# Patient Record
Sex: Female | Born: 1980 | Race: Black or African American | Hispanic: No | Marital: Single | Smoking: Never smoker
Health system: Southern US, Community
[De-identification: ages and names within clinical notes are randomized; demographics above are authoritative.]

## PROBLEM LIST (undated history)

## (undated) ENCOUNTER — Inpatient Hospital Stay (HOSPITAL_COMMUNITY): Payer: Self-pay

## (undated) DIAGNOSIS — I10 Essential (primary) hypertension: Secondary | ICD-10-CM

## (undated) DIAGNOSIS — A64 Unspecified sexually transmitted disease: Secondary | ICD-10-CM

## (undated) DIAGNOSIS — D259 Leiomyoma of uterus, unspecified: Secondary | ICD-10-CM

## (undated) DIAGNOSIS — K219 Gastro-esophageal reflux disease without esophagitis: Secondary | ICD-10-CM

## (undated) HISTORY — PX: INDUCED ABORTION: SHX677

## (undated) HISTORY — DX: Unspecified sexually transmitted disease: A64

## (undated) HISTORY — DX: Essential (primary) hypertension: I10

## (undated) HISTORY — PX: CYSTECTOMY: SUR359

---

## 2001-03-18 ENCOUNTER — Encounter: Payer: Self-pay | Admitting: Emergency Medicine

## 2001-03-18 ENCOUNTER — Emergency Department (HOSPITAL_COMMUNITY): Admission: EM | Admit: 2001-03-18 | Discharge: 2001-03-18 | Payer: Self-pay | Admitting: Emergency Medicine

## 2003-03-04 ENCOUNTER — Emergency Department (HOSPITAL_COMMUNITY): Admission: EM | Admit: 2003-03-04 | Discharge: 2003-03-04 | Payer: Self-pay | Admitting: Emergency Medicine

## 2004-02-28 ENCOUNTER — Emergency Department (HOSPITAL_COMMUNITY): Admission: EM | Admit: 2004-02-28 | Discharge: 2004-02-29 | Payer: Self-pay | Admitting: Emergency Medicine

## 2004-03-02 ENCOUNTER — Emergency Department (HOSPITAL_COMMUNITY): Admission: EM | Admit: 2004-03-02 | Discharge: 2004-03-02 | Payer: Self-pay

## 2004-03-04 ENCOUNTER — Emergency Department (HOSPITAL_COMMUNITY): Admission: EM | Admit: 2004-03-04 | Discharge: 2004-03-04 | Payer: Self-pay | Admitting: *Deleted

## 2005-03-12 ENCOUNTER — Emergency Department (HOSPITAL_COMMUNITY): Admission: EM | Admit: 2005-03-12 | Discharge: 2005-03-12 | Payer: Self-pay | Admitting: Emergency Medicine

## 2005-03-20 ENCOUNTER — Ambulatory Visit: Payer: Self-pay | Admitting: Nurse Practitioner

## 2005-03-25 ENCOUNTER — Ambulatory Visit: Payer: Self-pay | Admitting: Nurse Practitioner

## 2005-04-08 ENCOUNTER — Ambulatory Visit: Payer: Self-pay | Admitting: Nurse Practitioner

## 2005-04-19 ENCOUNTER — Ambulatory Visit: Payer: Self-pay | Admitting: *Deleted

## 2005-06-26 ENCOUNTER — Ambulatory Visit: Payer: Self-pay | Admitting: Nurse Practitioner

## 2005-09-11 ENCOUNTER — Ambulatory Visit: Payer: Self-pay | Admitting: Nurse Practitioner

## 2005-12-11 ENCOUNTER — Emergency Department (HOSPITAL_COMMUNITY): Admission: EM | Admit: 2005-12-11 | Discharge: 2005-12-11 | Payer: Self-pay | Admitting: Emergency Medicine

## 2006-04-15 ENCOUNTER — Emergency Department (HOSPITAL_COMMUNITY): Admission: EM | Admit: 2006-04-15 | Discharge: 2006-04-15 | Payer: Self-pay | Admitting: Family Medicine

## 2006-04-20 ENCOUNTER — Ambulatory Visit: Payer: Self-pay | Admitting: Nurse Practitioner

## 2006-04-21 ENCOUNTER — Emergency Department (HOSPITAL_COMMUNITY): Admission: EM | Admit: 2006-04-21 | Discharge: 2006-04-22 | Payer: Self-pay | Admitting: Emergency Medicine

## 2006-04-22 ENCOUNTER — Emergency Department (HOSPITAL_COMMUNITY): Admission: EM | Admit: 2006-04-22 | Discharge: 2006-04-22 | Payer: Self-pay | Admitting: Emergency Medicine

## 2006-05-05 ENCOUNTER — Ambulatory Visit: Payer: Self-pay | Admitting: Nurse Practitioner

## 2006-05-07 ENCOUNTER — Ambulatory Visit: Payer: Self-pay | Admitting: Nurse Practitioner

## 2006-05-19 ENCOUNTER — Ambulatory Visit: Payer: Self-pay | Admitting: Nurse Practitioner

## 2006-07-10 ENCOUNTER — Emergency Department (HOSPITAL_COMMUNITY): Admission: EM | Admit: 2006-07-10 | Discharge: 2006-07-10 | Payer: Self-pay | Admitting: Emergency Medicine

## 2006-08-31 ENCOUNTER — Emergency Department (HOSPITAL_COMMUNITY): Admission: EM | Admit: 2006-08-31 | Discharge: 2006-08-31 | Payer: Self-pay | Admitting: *Deleted

## 2006-09-02 ENCOUNTER — Inpatient Hospital Stay (HOSPITAL_COMMUNITY): Admission: AD | Admit: 2006-09-02 | Discharge: 2006-09-02 | Payer: Self-pay | Admitting: Obstetrics and Gynecology

## 2006-09-06 ENCOUNTER — Inpatient Hospital Stay (HOSPITAL_COMMUNITY): Admission: AD | Admit: 2006-09-06 | Discharge: 2006-09-06 | Payer: Self-pay | Admitting: Obstetrics & Gynecology

## 2006-09-14 ENCOUNTER — Inpatient Hospital Stay (HOSPITAL_COMMUNITY): Admission: AD | Admit: 2006-09-14 | Discharge: 2006-09-14 | Payer: Self-pay | Admitting: Obstetrics and Gynecology

## 2006-09-20 ENCOUNTER — Ambulatory Visit: Payer: Self-pay | Admitting: Nurse Practitioner

## 2006-10-08 ENCOUNTER — Ambulatory Visit: Payer: Self-pay | Admitting: Gynecology

## 2007-05-25 ENCOUNTER — Emergency Department (HOSPITAL_COMMUNITY): Admission: EM | Admit: 2007-05-25 | Discharge: 2007-05-25 | Payer: Self-pay | Admitting: Family Medicine

## 2007-06-01 ENCOUNTER — Emergency Department (HOSPITAL_COMMUNITY): Admission: EM | Admit: 2007-06-01 | Discharge: 2007-06-01 | Payer: Self-pay | Admitting: Family Medicine

## 2008-09-10 ENCOUNTER — Emergency Department (HOSPITAL_COMMUNITY): Admission: EM | Admit: 2008-09-10 | Discharge: 2008-09-11 | Payer: Self-pay | Admitting: Emergency Medicine

## 2010-09-17 LAB — URINALYSIS, ROUTINE W REFLEX MICROSCOPIC
Bilirubin Urine: NEGATIVE
Hgb urine dipstick: NEGATIVE
Specific Gravity, Urine: 1.03 (ref 1.005–1.030)
pH: 6 (ref 5.0–8.0)

## 2010-09-28 ENCOUNTER — Emergency Department (HOSPITAL_COMMUNITY)
Admission: EM | Admit: 2010-09-28 | Discharge: 2010-09-28 | Disposition: A | Discharge status: Home / Self Care | Payer: Self-pay | Attending: Emergency Medicine | Admitting: Emergency Medicine

## 2010-09-28 DIAGNOSIS — M79609 Pain in unspecified limb: Secondary | ICD-10-CM | POA: Insufficient documentation

## 2010-09-28 DIAGNOSIS — X58XXXA Exposure to other specified factors, initial encounter: Secondary | ICD-10-CM | POA: Insufficient documentation

## 2010-09-28 DIAGNOSIS — T148XXA Other injury of unspecified body region, initial encounter: Secondary | ICD-10-CM | POA: Insufficient documentation

## 2010-10-24 NOTE — Group Therapy Note (Signed)
NAMEMarland Kitchen  VEE, BAHE                 ACCOUNT NO.:  [id number]   MEDICAL RECORD NO.:  [id number]          PATIENT TYPE:  WOC   LOCATION:  [facility]                   FACILITY:  [facility]   PHYSICIAN:  Ginger Carne, MD DATE OF BIRTH:  Feb 09, 1981   DATE OF SERVICE:                                  CLINIC NOTE   CLINIC NOTE:  This patient is a 30 year old African-American female who  had a spontaneous abortion on 09/06/2006.  At this time she has no  specific symptoms.  No abnormal bleeding, cramping, or pain.   PHYSICAL FINDINGS:  ABDOMEN:  Soft.  PELVIC:  External genitalia, vulva  and vagina normal.  Cervix smooth without erosions or lesions.  Uterus  small, anteverted and flexed.  Both adnexa palpable and found to be  normal and nontender.   IMPRESSION:  Spontaneous abortion.   PLAN:  The patient wishes to conceive again.  She was advised to wait 3  to 6 months at a minimum and to keep track of her menses.           ______________________________  Ginger Carne, MD     SHB/MEDQ  D:  10/08/2006  T:  10/08/2006  Job:  [id number]

## 2011-04-09 ENCOUNTER — Emergency Department (HOSPITAL_COMMUNITY)
Admission: EM | Admit: 2011-04-09 | Discharge: 2011-04-09 | Disposition: A | Discharge status: Home / Self Care | Payer: Self-pay | Attending: Emergency Medicine | Admitting: Emergency Medicine

## 2011-04-09 DIAGNOSIS — M545 Low back pain, unspecified: Secondary | ICD-10-CM | POA: Insufficient documentation

## 2011-04-09 DIAGNOSIS — M549 Dorsalgia, unspecified: Secondary | ICD-10-CM | POA: Insufficient documentation

## 2011-12-31 ENCOUNTER — Emergency Department (INDEPENDENT_AMBULATORY_CARE_PROVIDER_SITE_OTHER)
Admission: EM | Admit: 2011-12-31 | Discharge: 2011-12-31 | Disposition: A | Discharge status: Home / Self Care | Payer: Self-pay | Source: Home / Self Care | Attending: Family Medicine | Admitting: Family Medicine

## 2011-12-31 ENCOUNTER — Encounter (HOSPITAL_COMMUNITY): Payer: Self-pay | Admitting: Emergency Medicine

## 2011-12-31 DIAGNOSIS — N898 Other specified noninflammatory disorders of vagina: Secondary | ICD-10-CM

## 2011-12-31 DIAGNOSIS — L0231 Cutaneous abscess of buttock: Secondary | ICD-10-CM

## 2011-12-31 LAB — POCT URINALYSIS DIP (DEVICE)
Bilirubin Urine: NEGATIVE
Ketones, ur: NEGATIVE mg/dL
Leukocytes, UA: NEGATIVE
pH: 6.5 (ref 5.0–8.0)

## 2011-12-31 LAB — WET PREP, GENITAL
Clue Cells Wet Prep HPF POC: NONE SEEN
Trich, Wet Prep: NONE SEEN
Yeast Wet Prep HPF POC: NONE SEEN

## 2011-12-31 MED ORDER — HYDROCODONE-ACETAMINOPHEN 5-500 MG PO TABS: 1.0000 | ORAL_TABLET | Freq: Four times a day (QID) | ORAL | Status: AC | PRN

## 2011-12-31 MED ORDER — FLUCONAZOLE 150 MG PO TABS: ORAL_TABLET | ORAL | Status: DC

## 2011-12-31 MED ORDER — DOXYCYCLINE HYCLATE 100 MG PO CAPS: 100.0000 mg | ORAL_CAPSULE | Freq: Two times a day (BID) | ORAL | Status: AC

## 2011-12-31 MED ORDER — METRONIDAZOLE 500 MG PO TABS: 500.0000 mg | ORAL_TABLET | Freq: Two times a day (BID) | ORAL | Status: AC

## 2011-12-31 NOTE — ED Notes (Signed)
C/c pt states vaginal discharge. It started 1 week ago.pt states of cramps on  lower abdomen. posibility of boil,right in middle btw anus and vagina. Boil started acting up since last Thursday.pt denies being pregnant. Pt has not take meds for any of this conditions.

## 2012-01-01 LAB — GC/CHLAMYDIA PROBE AMP, GENITAL: Chlamydia, DNA Probe: NEGATIVE

## 2012-01-02 NOTE — ED Provider Notes (Signed)
History     CSN: [id number]  Arrival date & time 12/31/11  1845   First MD Initiated Contact with Patient 12/31/11 1940      Chief Complaint  Patient presents with  . Vaginal Discharge    (Consider location/radiation/quality/duration/timing/severity/associated sxs/prior treatment) HPI Comments: 31 y/o female with no significant past ,edical history here c/o: 1) bad odorous vaginal discharge for 1 week associated with intermittent low abdominal cramps. denies dysuria or hematuria. Will like to be checked for STDs. Although declined blood tests.  2) Patient c/o area of tenderness, swelling for several days in left buttock near rectal area. Denies spontaneous drainage. No fever or chills. No h/o rectal fissures, chrons or colitis.      History reviewed. No pertinent past medical history.  Past Surgical History  Procedure Date  . Cystectomy     No family history on file.  History  Substance Use Topics  . Smoking status: Not on file  . Smokeless tobacco: Not on file  . Alcohol Use: Yes    OB History    Grav Para Term Preterm Abortions TAB SAB Ect Mult Living                  Review of Systems  Constitutional: Negative for fever and chills.  Respiratory: Negative for shortness of breath.   Cardiovascular: Negative for chest pain, palpitations and leg swelling.  Gastrointestinal: Negative for nausea, vomiting and abdominal pain.  Genitourinary: Positive for vaginal discharge. Negative for dysuria, urgency, hematuria, flank pain and genital sores.  Skin:       Boil in left buttock as per HPI.  Neurological: Negative for dizziness and headaches.  All other systems reviewed and are negative.    Allergies  Review of patient's allergies indicates no known allergies.  Home Medications   Current Outpatient Rx  Name Route Sig Dispense Refill  . DOXYCYCLINE HYCLATE 100 MG PO CAPS Oral Take 1 capsule (100 mg total) by mouth 2 (two) times daily. 20 capsule 0  .  FLUCONAZOLE 150 MG PO TABS  1 tab po q72 h x3 3 tablet 0  . HYDROCODONE-ACETAMINOPHEN 5-500 MG PO TABS Oral Take 1 tablet by mouth every 6 (six) hours as needed for pain. 10 tablet 0  . METRONIDAZOLE 500 MG PO TABS Oral Take 1 tablet (500 mg total) by mouth 2 (two) times daily. 14 tablet 0    BP 159/116  Pulse 79  Temp 98.1 F (36.7 C) (Oral)  Resp 20  SpO2 100%  LMP 12/16/2011  Physical Exam  Nursing note and vitals reviewed. Constitutional: She is oriented to person, place, and time. She appears well-developed and well-nourished.       Uncomfortable with pain in gluteal area.  Cardiovascular: Normal rate, regular rhythm and normal heart sounds.  Exam reveals no gallop and no friction rub.   No murmur heard. Pulmonary/Chest: Breath sounds normal. No respiratory distress.  Genitourinary: Rectum normal and uterus normal. Rectal exam shows no external hemorrhoid, no internal hemorrhoid, no fissure, no mass, no tenderness and anal tone normal. There is no rash or lesion on the right labia. There is no rash or lesion on the left labia. Cervix exhibits no motion tenderness. Right adnexum displays no mass, no tenderness and no fullness. Left adnexum displays no mass, no tenderness and no fullness. Vaginal discharge found.  Neurological: She is alert and oriented to person, place, and time.  Skin:       Left gluteal area (near but not  involving rectum) there is a single area of about 2x3cm tenderness to palpation with fluctuation and some induration around consistent with an abscess.     ED Course  INCISION AND DRAINAGE Performed by: Sharin Grave Authorized by: Sharin Grave Consent: Verbal consent obtained. Risks and benefits: risks, benefits and alternatives were discussed Consent given by: patient Patient understanding: patient states understanding of the procedure being performed Patient consent: the patient's understanding of the procedure matches consent given Type:  abscess Body area: anogenital Location details: perianal Anesthesia: local infiltration Local anesthetic: lidocaine 1% with epinephrine Anesthetic total: 3 ml Scalpel size: 11 Incision type: single straight Complexity: simple Drainage: purulent and bloody Drainage amount: moderate Packing material: 1/2 in iodoform gauze Patient tolerance: Patient tolerated the procedure well with no immediate complications. Comments: Wound culture pending   (including critical care time)  Labs Reviewed  WET PREP, GENITAL - Abnormal; Notable for the following:    WBC, Wet Prep HPF POC MANY (*)     All other components within normal limits  POCT URINALYSIS DIP (DEVICE) - Abnormal; Notable for the following:    Hgb urine dipstick MODERATE (*)     Protein, ur 30 (*)     All other components within normal limits  GC/CHLAMYDIA PROBE AMP, GENITAL  CULTURE, ROUTINE-ABSCESS  POCT PREGNANCY, URINE   No results found.   1. Vaginal Discharge   2. Abscess, gluteal, left       MDM  Perirectal abscess s/p I&D today. Wound care instructions provided. Prescribed doxycycline and flagyl. Also gave prescription for Vicodin. Asked to return in 24-48 hours fr recheck. Or Go to the ED if new onset of fever worsening pain, swelling or drainage, despite treatment. Also needs to have BP and urine rechecked. Impress UA results (with moderate hemoglobin) related to contamination as urine sample obtained for pregnancy test after procedure. Patient required diflucan prescription stating that she "gets yeast infection every times she takes antibiotics"        Sharin Grave, MD 01/02/12 1240

## 2012-01-04 NOTE — ED Notes (Signed)
GC/Chlamydia neg., Wet prep: many WBC's, Abscess culture perirectal: Multiple organisms present none predominant. Pt. treated with Doxycycline. Vassie Moselle 01/04/2012

## 2013-11-28 ENCOUNTER — Encounter (HOSPITAL_COMMUNITY): Payer: Self-pay | Admitting: Emergency Medicine

## 2013-11-28 ENCOUNTER — Emergency Department (HOSPITAL_COMMUNITY)
Admission: EM | Admit: 2013-11-28 | Discharge: 2013-11-28 | Disposition: A | Discharge status: Home / Self Care | Payer: No Typology Code available for payment source | Source: Home / Self Care

## 2013-11-28 DIAGNOSIS — J302 Other seasonal allergic rhinitis: Secondary | ICD-10-CM

## 2013-11-28 DIAGNOSIS — J3089 Other allergic rhinitis: Secondary | ICD-10-CM

## 2013-11-28 DIAGNOSIS — J029 Acute pharyngitis, unspecified: Secondary | ICD-10-CM

## 2013-11-28 LAB — POCT RAPID STREP A: STREPTOCOCCUS, GROUP A SCREEN (DIRECT): NEGATIVE

## 2013-11-28 NOTE — ED Provider Notes (Signed)
CSN: [id number]     Arrival date & time 11/28/13  1436 History   None    Chief Complaint  Patient presents with  . Sore Throat   (Consider location/radiation/quality/duration/timing/severity/associated sxs/prior Treatment) HPI Comments: 33 year old female with a sore throat for greater than one week. Denies fever, chills or earache. She does endorse PND.  Patient is a 32 y.o. female presenting with pharyngitis.  Sore Throat Pertinent negatives include no shortness of breath.    History reviewed. No pertinent past medical history. Past Surgical History  Procedure Laterality Date  . Cystectomy     History reviewed. No pertinent family history. History  Substance Use Topics  . Smoking status: Not on file  . Smokeless tobacco: Not on file  . Alcohol Use: Yes   OB History   Grav Para Term Preterm Abortions TAB SAB Ect Mult Living                 Review of Systems  Constitutional: Negative for fever, chills, activity change, appetite change and fatigue.  HENT: Positive for congestion, postnasal drip and rhinorrhea. Negative for facial swelling.   Eyes: Negative.   Respiratory: Negative.  Negative for cough and shortness of breath.   Cardiovascular: Negative.   Musculoskeletal: Negative for neck pain and neck stiffness.  Skin: Negative for pallor and rash.  Neurological: Negative.     Allergies  Review of patient's allergies indicates no known allergies.  Home Medications   Prior to Admission medications   Medication Sig Start Date End Date Taking? Authorizing Provider  fluconazole (DIFLUCAN) 150 MG tablet 1 tab po q72 h x3 12/31/11   Adlih Moreno-Coll, MD   BP 162/121  Pulse 82  Temp(Src) 98.6 F (37 C) (Oral)  Resp 16  SpO2 98%  LMP 10/25/2013 Physical Exam  Nursing note and vitals reviewed. Constitutional: She is oriented to person, place, and time. She appears well-developed and well-nourished. No distress.  HENT:  Mouth/Throat: No oropharyngeal exudate.   Bilateral TMs are mildly retracted, otherwise normal Oropharynx with clear PND mild erythema and cobblestoning.  Eyes: Conjunctivae and EOM are normal.  Neck: Normal range of motion. Neck supple.  Cardiovascular: Normal rate, regular rhythm and normal heart sounds.   Pulmonary/Chest: Effort normal and breath sounds normal. No respiratory distress. She has no wheezes. She has no rales.  Musculoskeletal: Normal range of motion. She exhibits no edema.  Lymphadenopathy:    She has no cervical adenopathy.  Neurological: She is alert and oriented to person, place, and time.  Skin: Skin is warm and dry. No rash noted.  Psychiatric: She has a normal mood and affect.    ED Course  Procedures (including critical care time) Labs Review Labs Reviewed  POCT RAPID STREP A ([facility]ARE ONLY)    Imaging Review No results found. Results for orders placed during the hospital encounter of 11/28/13  POCT RAPID STREP A ([facility] ONLY)      Result Value Ref Range   Streptococcus, Group A Screen (Direct) NEGATIVE  NEGATIVE     MDM   1. Other seasonal allergic rhinitis   2. Allergic pharyngitis     Allegra or claritin Lots of nasal saline Sudafed PE for congestion Flonase nasal spray Lots of fluids po      Janne Napoleon, NP 11/28/13 1552

## 2013-11-28 NOTE — ED Provider Notes (Signed)
Medical screening examination/treatment/procedure(s) were performed by resident physician or non-physician practitioner and as supervising physician I was immediately available for consultation/collaboration.   Pauline Good MD.   Billy Fischer, MD 11/28/13 [phone]

## 2013-11-28 NOTE — ED Notes (Signed)
C/o sore throat States at night the pain gets worst States its to hard swallow Does have heartburn Used tea, halls, and mucinex as tx

## 2013-11-28 NOTE — Discharge Instructions (Signed)
Allergic Rhinitis Allegra or claritin Lots of nasal saline Sudafed PE for congestion Flonase nasal spray Allergic rhinitis is when the mucous membranes in the nose respond to allergens. Allergens are particles in the air that cause your body to have an allergic reaction. This causes you to release allergic antibodies. Through a chain of events, these eventually cause you to release histamine into the blood stream. Although meant to protect the body, it is this release of histamine that causes your discomfort, such as frequent sneezing, congestion, and an itchy, runny nose.  CAUSES  Seasonal allergic rhinitis (hay fever) is caused by pollen allergens that may come from grasses, trees, and weeds. Year-round allergic rhinitis (perennial allergic rhinitis) is caused by allergens such as house dust mites, pet dander, and mold spores.  SYMPTOMS   Nasal stuffiness (congestion).  Itchy, runny nose with sneezing and tearing of the eyes. DIAGNOSIS  Your health care provider can help you determine the allergen or allergens that trigger your symptoms. If you and your health care provider are unable to determine the allergen, skin or blood testing may be used. TREATMENT  Allergic rhinitis does not have a cure, but it can be controlled by:  Medicines and allergy shots (immunotherapy).  Avoiding the allergen. Hay fever may often be treated with antihistamines in pill or nasal spray forms. Antihistamines block the effects of histamine. There are over-the-counter medicines that may help with nasal congestion and swelling around the eyes. Check with your health care provider before taking or giving this medicine.  If avoiding the allergen or the medicine prescribed do not work, there are many new medicines your health care provider can prescribe. Stronger medicine may be used if initial measures are ineffective. Desensitizing injections can be used if medicine and avoidance does not work. Desensitization is when  a patient is given ongoing shots until the body becomes less sensitive to the allergen. Make sure you follow up with your health care provider if problems continue. HOME CARE INSTRUCTIONS It is not possible to completely avoid allergens, but you can reduce your symptoms by taking steps to limit your exposure to them. It helps to know exactly what you are allergic to so that you can avoid your specific triggers. SEEK MEDICAL CARE IF:   You have a fever.  You develop a cough that does not stop easily (persistent).  You have shortness of breath.  You start wheezing.  Symptoms interfere with normal daily activities. Document Released: 02/17/2001 Document Revised: 05/30/2013 Document Reviewed: 01/30/2013 [facility] Patient Information 2015 [place], [place]. This information is not intended to replace advice given to you by your health care provider. Make sure you discuss any questions you have with your health care provider.  Sore Throat A sore throat is pain, burning, irritation, or scratchiness of the throat. There is often pain or tenderness when swallowing or talking. A sore throat may be accompanied by other symptoms, such as coughing, sneezing, fever, and swollen neck glands. A sore throat is often the first sign of another sickness, such as a cold, flu, strep throat, or mononucleosis (commonly known as mono). Most sore throats go away without medical treatment. CAUSES  The most common causes of a sore throat include:  A viral infection, such as a cold, flu, or mono.  A bacterial infection, such as strep throat, tonsillitis, or whooping cough.  Seasonal allergies.  Dryness in the air.  Irritants, such as smoke or pollution.  Gastroesophageal reflux disease (GERD). HOME CARE INSTRUCTIONS   Only  take over-the-counter medicines as directed by your caregiver.  Drink enough fluids to keep your urine clear or pale yellow.  Rest as needed.  Try using throat sprays, lozenges, or  sucking on hard candy to ease any pain (if older than 4 years or as directed).  Sip warm liquids, such as broth, herbal tea, or warm water with honey to relieve pain temporarily. You may also eat or drink cold or frozen liquids such as frozen ice pops.  Gargle with salt water (mix 1 tsp salt with 8 oz of water).  Do not smoke and avoid secondhand smoke.  Put a cool-mist humidifier in your bedroom at night to moisten the air. You can also turn on a hot shower and sit in the bathroom with the door closed for 5-10 minutes. SEEK IMMEDIATE MEDICAL CARE IF:  You have difficulty breathing.  You are unable to swallow fluids, soft foods, or your saliva.  You have increased swelling in the throat.  Your sore throat does not get better in 7 days.  You have nausea and vomiting.  You have a fever or persistent symptoms for more than 2-3 days.  You have a fever and your symptoms suddenly get worse. MAKE SURE YOU:   Understand these instructions.  Will watch your condition.  Will get help right away if you are not doing well or get worse. Document Released: 07/02/2004 Document Revised: 05/11/2012 Document Reviewed: 01/31/2012 [facility] Patient Information 2015 [place], [place]. This information is not intended to replace advice given to you by your health care provider. Make sure you discuss any questions you have with your health care provider.

## 2013-11-30 LAB — CULTURE, GROUP A STREP

## 2015-02-14 ENCOUNTER — Emergency Department (INDEPENDENT_AMBULATORY_CARE_PROVIDER_SITE_OTHER)
Admission: EM | Admit: 2015-02-14 | Discharge: 2015-02-14 | Disposition: A | Discharge status: Home / Self Care | Payer: Self-pay | Source: Home / Self Care | Attending: Family Medicine | Admitting: Family Medicine

## 2015-02-14 ENCOUNTER — Encounter (HOSPITAL_COMMUNITY): Payer: Self-pay | Admitting: *Deleted

## 2015-02-14 DIAGNOSIS — S40862A Insect bite (nonvenomous) of left upper arm, initial encounter: Secondary | ICD-10-CM

## 2015-02-14 DIAGNOSIS — W57XXXA Bitten or stung by nonvenomous insect and other nonvenomous arthropods, initial encounter: Secondary | ICD-10-CM

## 2015-02-14 MED ORDER — FLUTICASONE PROPIONATE 0.05 % EX CREA: TOPICAL_CREAM | Freq: Two times a day (BID) | CUTANEOUS | Status: DC

## 2015-02-14 MED ORDER — TRIAMCINOLONE ACETONIDE 40 MG/ML IJ SUSP
40.0000 mg | Freq: Once | INTRAMUSCULAR | Status: AC
  Administered 2015-02-14: 40 mg via INTRAMUSCULAR

## 2015-02-14 MED ORDER — TRIAMCINOLONE ACETONIDE 40 MG/ML IJ SUSP
INTRAMUSCULAR | Status: AC
  Filled 2015-02-14: qty 1

## 2015-02-14 NOTE — ED Provider Notes (Signed)
CSN: [id number]     Arrival date & time 02/14/15  1905 History   First MD Initiated Contact with Patient 02/14/15 2001     Chief Complaint  Patient presents with  . Insect Bite   (Consider location/radiation/quality/duration/timing/severity/associated sxs/prior Treatment) Patient is a 34 y.o. female presenting with rash. The history is provided by the patient.  Rash Location:  Shoulder/arm Shoulder/arm rash location:  L upper arm Quality: itchiness, painful, redness and swelling   Pain details:    Severity:  Mild   Progression:  Unchanged Severity:  Mild Onset quality:  Sudden Duration:  1 day Progression:  Unchanged Chronicity:  New Context: insect bite/sting     History reviewed. No pertinent past medical history. Past Surgical History  Procedure Laterality Date  . Cystectomy     History reviewed. No pertinent family history. Social History  Substance Use Topics  . Smoking status: Never Smoker   . Smokeless tobacco: None  . Alcohol Use: Yes   OB History    No data available     Review of Systems  Constitutional: Negative.   Respiratory: Negative.   Cardiovascular: Negative.   Gastrointestinal: Negative.   Skin: Positive for rash.  All other systems reviewed and are negative.   Allergies  Review of patient's allergies indicates no known allergies.  Home Medications   Prior to Admission medications   Medication Sig Start Date End Date Taking? Authorizing Provider  fluconazole (DIFLUCAN) 150 MG tablet 1 tab po q72 h x3 12/31/11   Adlih Moreno-Coll, MD  fluticasone (CUTIVATE) 0.05 % cream Apply topically 2 (two) times daily. 02/14/15   Billy Fischer, MD   Meds Ordered and Administered this Visit   Medications  triamcinolone acetonide (KENALOG-40) injection 40 mg (not administered)    BP 164/77 mmHg  Pulse 67  Temp(Src) 99.5 F (37.5 C) (Oral)  Resp 16  SpO2 100%  LMP 01/24/2015 No data found.   Physical Exam  Constitutional: She is oriented to  person, place, and time. She appears well-developed and well-nourished. No distress.  Pulmonary/Chest: Breath sounds normal.  Neurological: She is alert and oriented to person, place, and time.  Skin: Skin is warm and dry. Rash noted. There is erythema.  Local lg hive swelling to left upper arm only, no lesions , no drainage.  Nursing note and vitals reviewed.   ED Course  Procedures (including critical care time)  Labs Review Labs Reviewed - No data to display  Imaging Review No results found.   Visual Acuity Review  Right Eye Distance:   Left Eye Distance:   Bilateral Distance:    Right Eye Near:   Left Eye Near:    Bilateral Near:         MDM   1. Insect bite of left upper arm with local reaction, initial encounter     rx cutivate cr.    Billy Fischer, MD 02/14/15 2020

## 2015-02-14 NOTE — ED Notes (Signed)
Pt  Reports  She  Was  Possibly  biied  By  Something   yest   She  Felt a  Sting l  Upper  Arm  And  Has  A  Raised  Itching  Burning  Sensation  To the  l  Upper  Arm        No  Angioedema  Speaking in  Complete  sentances  And  Appears in no  Distress

## 2017-03-26 ENCOUNTER — Encounter: Payer: Self-pay | Admitting: Nurse Practitioner

## 2017-03-26 ENCOUNTER — Other Ambulatory Visit (INDEPENDENT_AMBULATORY_CARE_PROVIDER_SITE_OTHER): Payer: Commercial Managed Care - PPO

## 2017-03-26 ENCOUNTER — Ambulatory Visit (INDEPENDENT_AMBULATORY_CARE_PROVIDER_SITE_OTHER): Payer: Commercial Managed Care - PPO | Admitting: Nurse Practitioner

## 2017-03-26 VITALS — BP 150/102 | HR 78 | Temp 98.9°F | Resp 16 | Ht 68.0 in | Wt 253.0 lb

## 2017-03-26 DIAGNOSIS — R5383 Other fatigue: Secondary | ICD-10-CM

## 2017-03-26 DIAGNOSIS — I10 Essential (primary) hypertension: Secondary | ICD-10-CM

## 2017-03-26 DIAGNOSIS — K2100 Gastro-esophageal reflux disease with esophagitis, without bleeding: Secondary | ICD-10-CM | POA: Insufficient documentation

## 2017-03-26 DIAGNOSIS — Z01419 Encounter for gynecological examination (general) (routine) without abnormal findings: Secondary | ICD-10-CM

## 2017-03-26 DIAGNOSIS — O10919 Unspecified pre-existing hypertension complicating pregnancy, unspecified trimester: Secondary | ICD-10-CM | POA: Insufficient documentation

## 2017-03-26 LAB — CBC
HCT: 39.4 % (ref 36.0–46.0)
HEMOGLOBIN: 12.6 g/dL (ref 12.0–15.0)
MCHC: 31.8 g/dL (ref 30.0–36.0)
MCV: 83.6 fl (ref 78.0–100.0)
PLATELETS: 236 10*3/uL (ref 150.0–400.0)
RBC: 4.71 Mil/uL (ref 3.87–5.11)
RDW: 15.6 % — ABNORMAL HIGH (ref 11.5–15.5)
WBC: 6.4 10*3/uL (ref 4.0–10.5)

## 2017-03-26 LAB — BASIC METABOLIC PANEL
BUN: 9 mg/dL (ref 6–23)
CO2: 27 mEq/L (ref 19–32)
Calcium: 9 mg/dL (ref 8.4–10.5)
Chloride: 102 mEq/L (ref 96–112)
Creatinine, Ser: 0.96 mg/dL (ref 0.40–1.20)
GFR: 84.06 mL/min (ref >60.00)
Glucose, Bld: 86 mg/dL (ref 70–99)
POTASSIUM: 3.4 meq/L — AB (ref 3.5–5.1)
SODIUM: 136 meq/L (ref 135–145)

## 2017-03-26 MED ORDER — AMLODIPINE BESYLATE 5 MG PO TABS: 5.0000 mg | ORAL_TABLET | Freq: Every day | ORAL | 1 refills | Status: DC

## 2017-03-26 MED ORDER — RANITIDINE HCL 150 MG PO TABS: 150.0000 mg | ORAL_TABLET | Freq: Two times a day (BID) | ORAL | 1 refills | Status: DC

## 2017-03-26 NOTE — Assessment & Plan Note (Addendum)
Daily symptoms- cough, esophageal burning, regurgitation. She takes zantac prn with relief but hesitates to take a daily medication. Discussed risks of untreated esophagitis and importance of symptom control. We talked about food triggers and lifestyle modifications for GERD. Food handout given. She agreed to try zantac 150mg  twice daily for 6 weeks, prescription sent. Will return in 4-6 for follow up

## 2017-03-26 NOTE — Patient Instructions (Addendum)
Please head downstairs for lab work.  I have sent prescriptions for zantac for your heartburn and amlodipine for your blood pressure to your pharmacy. Please take zantac twice daily for 6 weeks. Please begin taking amlodipine once daily.  I have sent a referral for you to see a gynecologist. Our office will call you to schedule this appointment.  Please work on your diet and exercise as we discussed. Remember half of your plate should be veggies, one-fourth carbs, one-fourth meat, and don't eat meat at every meal. Also, remember to stay away from sugary drinks. I'd like for you to start incorporating exercise into your daily schedule. Start at 10 minutes a day, working up to 30 minutes five times a week.   I'd like to see you back in 4-6 weeks, so that we can recheck your blood pressure.  It was nice to meet you. Thanks for letting me take care of you today :)    Food Choices for Gastroesophageal Reflux Disease, Adult When you have gastroesophageal reflux disease (GERD), the foods you eat and your eating habits are very important. Choosing the right foods can help ease your discomfort. What guidelines do I need to follow?  Choose fruits, vegetables, whole grains, and low-fat dairy products.  Choose low-fat meat, fish, and poultry.  Limit fats such as oils, salad dressings, butter, nuts, and avocado.  Keep a food diary. This helps you identify foods that cause symptoms.  Avoid foods that cause symptoms. These may be different for everyone.  Eat small meals often instead of 3 large meals a day.  Eat your meals slowly, in a place where you are relaxed.  Limit fried foods.  Cook foods using methods other than frying.  Avoid drinking alcohol.  Avoid drinking large amounts of liquids with your meals.  Avoid bending over or lying down until 2-3 hours after eating. What foods are not recommended? These are some foods and drinks that may make your symptoms  worse: Vegetables Tomatoes. Tomato juice. Tomato and spaghetti sauce. Chili peppers. Onion and garlic. Horseradish. Fruits Oranges, grapefruit, and lemon (fruit and juice). Meats High-fat meats, fish, and poultry. This includes hot dogs, ribs, ham, sausage, salami, and bacon. Dairy Whole milk and chocolate milk. Sour cream. Cream. Butter. Ice cream. Cream cheese. Drinks Coffee and tea. Bubbly (carbonated) drinks or energy drinks. Condiments Hot sauce. Barbecue sauce. Sweets/Desserts Chocolate and cocoa. Donuts. Peppermint and spearmint. Fats and Oils High-fat foods. This includes [place] fries and potato chips. Other Vinegar. Strong spices. This includes black pepper, white pepper, red pepper, cayenne, curry powder, cloves, ginger, and chili powder. The items listed above may not be a complete list of foods and drinks to avoid. Contact your dietitian for more information. This information is not intended to replace advice given to you by your health care provider. Make sure you discuss any questions you have with your health care provider. Document Released: 11/24/2011 Document Revised: 10/31/2015 Document Reviewed: 03/29/2013 Elsevier Interactive Patient Education  2017 [employer].

## 2017-03-26 NOTE — Assessment & Plan Note (Addendum)
BP elevated above goal 140/90 today and multiple documented readings in the past. Morbidly obese with poor diet. We discussed improving diet and exercise and initiated CCB therapy. Amlodipine 5mg  daily started. BMET obtained. Ill see her back in about 4-6 weeks for a blood pressure recheck.

## 2017-03-26 NOTE — Progress Notes (Signed)
Subjective:    Patient ID: Brenda Colon, female    DOB: Apr 13, 1981, 36 y.o.   MRN: [id number]  HPI Brenda Colon is a 36 yo female who presents today to establish care.  Heartburn- Experiences symptoms almost daily. She reports cough, regurgitation. She denies weight loss, difficulty swallowing, chest pain, shortness of breath, nausea, vomiting, aborminal pain, black stools. The heartburn is worse after certain foods- pizza, rice. Shes tried zantac with relief.  Fatigue- She feels tired every day. She sleeps about 3 hours a night. She recently changed her work schedule so she can try to sleep more. She has trouble falling asleep and often gets up to restroom several times at night. She began taking melatonin this week which has helped. She often drinks fliuds and eats dinner right before bed.  Hypertension- She does not check her blood pressure at home. She Has not been on anything for blood pressure in the past. She has been told several times in the past that her BP was elevated.   BP Readings from Last 3 Encounters:  03/26/17 (!) 150/102  02/14/15 164/77  11/28/13 (!) 162/121   Womens gynecological health-She is wanting to get pregnant and would like referral to gyn.   Review of Systems  Constitutional: Negative for activity change and appetite change.  HENT: Negative for congestion, dental problem and sore throat.   Eyes: Negative for visual disturbance.  Respiratory: Negative for chest tightness.   Cardiovascular: Positive for leg swelling. Negative for palpitations.  Gastrointestinal: Negative for abdominal distention, constipation and diarrhea.  Endocrine: Negative for cold intolerance and heat intolerance.  Genitourinary: Negative for difficulty urinating, dysuria, vaginal bleeding and vaginal discharge.  Musculoskeletal: Positive for back pain. Negative for myalgias.  Skin: Negative for rash.  Allergic/Immunologic: Positive for environmental allergies. Negative for food  allergies.  Neurological: Negative for dizziness and weakness.  Hematological: Negative for adenopathy. Does not bruise/bleed easily.  Psychiatric/Behavioral:       Denies anxiety or depression    History reviewed. No pertinent past medical history.   Social History   Social History  . Marital status: Single    Spouse name: N/A  . Number of children: N/A  . Years of education: N/A   Occupational History  . Not on file.   Social History Main Topics  . Smoking status: Never Smoker  . Smokeless tobacco: Never Used  . Alcohol use Yes  . Drug use: No  . Sexual activity: Yes    Partners: Male    Birth control/ protection: None   Other Topics Concern  . Not on file   Social History Narrative  . No narrative on file    Past Surgical History:  Procedure Laterality Date  . CYSTECTOMY    . INDUCED ABORTION      Family History  Problem Relation Age of Onset  . Diabetes Mother   . Diabetes Father   . Lupus Sister     No Known Allergies  No current outpatient prescriptions on file prior to visit.   No current facility-administered medications on file prior to visit.     BP (!) 150/102 (BP Location: Left Arm, Patient Position: Sitting, Cuff Size: Large)   Pulse 78   Temp 98.9 F (37.2 C) (Oral)   Resp 16   Ht 5\' 8"  (1.727 m)   Wt 253 lb (114.8 kg)   SpO2 98%   BMI 38.47 kg/m      Objective:   Physical Exam  Constitutional: She is  oriented to person, place, and time. She appears well-developed and well-nourished.  Extremely obese BMI  Cardiovascular: Normal rate, regular rhythm, normal heart sounds and intact distal pulses.   Pulmonary/Chest: Effort normal and breath sounds normal.  Abdominal: Soft. Bowel sounds are normal. There is no tenderness.  Neurological: She is alert and oriented to person, place, and time.  Skin: Skin is warm and dry.  Psychiatric: She has a normal mood and affect. Her behavior is normal. Judgment and thought content normal.        Assessment & Plan:  Womens annual routine gynecologcal examination- She is wanting to get pregnant. shes had miscarriage in the past. Shed like to see GYN to discuss fertility. Referral placed.  Fatigue- Suspect due to lack of sleep. Discussed sleep hygiene. Shell continue to use melatonin OTC. CBC ordered to r/o anemia

## 2017-05-10 ENCOUNTER — Encounter: Payer: Self-pay | Admitting: Women's Health

## 2017-05-10 ENCOUNTER — Ambulatory Visit (INDEPENDENT_AMBULATORY_CARE_PROVIDER_SITE_OTHER): Payer: Commercial Managed Care - PPO | Admitting: Women's Health

## 2017-05-10 VITALS — BP 118/78 | Ht 68.0 in | Wt 248.0 lb

## 2017-05-10 DIAGNOSIS — Z113 Encounter for screening for infections with a predominantly sexual mode of transmission: Secondary | ICD-10-CM

## 2017-05-10 DIAGNOSIS — Z01411 Encounter for gynecological examination (general) (routine) with abnormal findings: Secondary | ICD-10-CM | POA: Diagnosis not present

## 2017-05-10 DIAGNOSIS — N912 Amenorrhea, unspecified: Secondary | ICD-10-CM

## 2017-05-10 LAB — PREGNANCY, URINE: Preg Test, Ur: POSITIVE — AB

## 2017-05-10 NOTE — Patient Instructions (Signed)
First Trimester of Pregnancy The first trimester of pregnancy is from week 1 until the end of week 13 (months 1 through 3). During this time, your baby will begin to develop inside you. At 6-8 weeks, the eyes and face are formed, and the heartbeat can be seen on ultrasound. At the end of 12 weeks, all the baby's organs are formed. Prenatal care is all the medical care you receive before the birth of your baby. Make sure you get good prenatal care and follow all of your doctor's instructions. Follow these instructions at home: Medicines  Take over-the-counter and prescription medicines only as told by your doctor. Some medicines are safe and some medicines are not safe during pregnancy.  Take a prenatal vitamin that contains at least 600 micrograms (mcg) of folic acid.  If you have trouble pooping (constipation), take medicine that will make your stool soft (stool softener) if your doctor approves. Eating and drinking  Eat regular, healthy meals.  Your doctor will tell you the amount of weight gain that is right for you.  Avoid raw meat and uncooked cheese.  If you feel sick to your stomach (nauseous) or throw up (vomit): ? Eat 4 or 5 small meals a day instead of 3 large meals. ? Try eating a few soda crackers. ? Drink liquids between meals instead of during meals.  To prevent constipation: ? Eat foods that are high in fiber, like fresh fruits and vegetables, whole grains, and beans. ? Drink enough fluids to keep your pee (urine) clear or pale yellow. Activity  Exercise only as told by your doctor. Stop exercising if you have cramps or pain in your lower belly (abdomen) or low back.  Do not exercise if it is too hot, too humid, or if you are in a place of great height (high altitude).  Try to avoid standing for long periods of time. Move your legs often if you must stand in one place for a long time.  Avoid heavy lifting.  Wear low-heeled shoes. Sit and stand up straight.  You  can have sex unless your doctor tells you not to. Relieving pain and discomfort  Wear a good support bra if your breasts are sore.  Take warm water baths (sitz baths) to soothe pain or discomfort caused by hemorrhoids. Use hemorrhoid cream if your doctor says it is okay.  Rest with your legs raised if you have leg cramps or low back pain.  If you have puffy, bulging veins (varicose veins) in your legs: ? Wear support hose or compression stockings as told by your doctor. ? Raise (elevate) your feet for 15 minutes, 3-4 times a day. ? Limit salt in your food. Prenatal care  Schedule your prenatal visits by the twelfth week of pregnancy.  Write down your questions. Take them to your prenatal visits.  Keep all your prenatal visits as told by your doctor. This is important. Safety  Wear your seat belt at all times when driving.  Make a list of emergency phone numbers. The list should include numbers for family, friends, the hospital, and police and fire departments. General instructions  Ask your doctor for a referral to a local prenatal class. Begin classes no later than at the start of month 6 of your pregnancy.  Ask for help if you need counseling or if you need help with nutrition. Your doctor can give you advice or tell you where to go for help.  Do not use hot tubs, steam rooms, or   saunas.  Do not douche or use tampons or scented sanitary pads.  Do not cross your legs for long periods of time.  Avoid all herbs and alcohol. Avoid drugs that are not approved by your doctor.  Do not use any tobacco products, including cigarettes, chewing tobacco, and electronic cigarettes. If you need help quitting, ask your doctor. You may get counseling or other support to help you quit.  Avoid cat litter boxes and soil used by cats. These carry germs that can cause birth defects in the baby and can cause a loss of your baby (miscarriage) or stillbirth.  Visit your dentist. At home, brush  your teeth with a soft toothbrush. Be gentle when you floss. Contact a doctor if:  You are dizzy.  You have mild cramps or pressure in your lower belly.  You have a nagging pain in your belly area.  You continue to feel sick to your stomach, you throw up, or you have watery poop (diarrhea).  You have a bad smelling fluid coming from your vagina.  You have pain when you pee (urinate).  You have increased puffiness (swelling) in your face, hands, legs, or ankles. Get help right away if:  You have a fever.  You are leaking fluid from your vagina.  You have spotting or bleeding from your vagina.  You have very bad belly cramping or pain.  You gain or lose weight rapidly.  You throw up blood. It may look like coffee grounds.  You are around people who have [place] measles, fifth disease, or chickenpox.  You have a very bad headache.  You have shortness of breath.  You have any kind of trauma, such as from a fall or a car accident. Summary  The first trimester of pregnancy is from week 1 until the end of week 13 (months 1 through 3).  To take care of yourself and your unborn baby, you will need to eat healthy meals, take medicines only if your doctor tells you to do so, and do activities that are safe for you and your baby.  Keep all follow-up visits as told by your doctor. This is important as your doctor will have to ensure that your baby is healthy and growing well. This information is not intended to replace advice given to you by your health care provider. Make sure you discuss any questions you have with your health care provider. Document Released: 11/11/2007 Document Revised: 06/02/2016 Document Reviewed: 06/02/2016 Elsevier Interactive Patient Education  2017 [place] DASH stands for "Dietary Approaches to Stop Hypertension." The DASH eating plan is a healthy eating plan that has been shown to reduce high blood pressure (hypertension). It may  also reduce your risk for type 2 diabetes, heart disease, and stroke. The DASH eating plan may also help with weight loss. What are tips for following this plan? General guidelines  Avoid eating more than 2,300 mg (milligrams) of salt (sodium) a day. If you have hypertension, you may need to reduce your sodium intake to 1,500 mg a day.  Limit alcohol intake to no more than 1 drink a day for nonpregnant women and 2 drinks a day for men. One drink equals 12 oz of beer, 5 oz of wine, or 1 oz of hard liquor.  Work with your health care provider to maintain a healthy body weight or to lose weight. Ask what an ideal weight is for you.  Get at least 30 minutes of exercise that causes your  heart to beat faster (aerobic exercise) most days of the week. Activities may include walking, swimming, or biking.  Work with your health care provider or diet and nutrition specialist (dietitian) to adjust your eating plan to your individual calorie needs. Reading food labels  Check food labels for the amount of sodium per serving. Choose foods with less than 5 percent of the Daily Value of sodium. Generally, foods with less than 300 mg of sodium per serving fit into this eating plan.  To find whole grains, look for the word "whole" as the first word in the ingredient list. Shopping  Buy products labeled as "low-sodium" or "no salt added."  Buy fresh foods. Avoid canned foods and premade or frozen meals. Cooking  Avoid adding salt when cooking. Use salt-free seasonings or herbs instead of table salt or sea salt. Check with your health care provider or pharmacist before using salt substitutes.  Do not fry foods. Cook foods using healthy methods such as baking, boiling, grilling, and broiling instead.  Cook with heart-healthy oils, such as olive, canola, soybean, or sunflower oil. Meal planning   Eat a balanced diet that includes: ? 5 or more servings of fruits and vegetables each day. At each meal, try  to fill half of your plate with fruits and vegetables. ? Up to 6-8 servings of whole grains each day. ? Less than 6 oz of lean meat, poultry, or fish each day. A 3-oz serving of meat is about the same size as a deck of cards. One egg equals 1 oz. ? 2 servings of low-fat dairy each day. ? A serving of nuts, seeds, or beans 5 times each week. ? Heart-healthy fats. Healthy fats called Omega-3 fatty acids are found in foods such as flaxseeds and coldwater fish, like sardines, salmon, and mackerel.  Limit how much you eat of the following: ? Canned or prepackaged foods. ? Food that is high in trans fat, such as fried foods. ? Food that is high in saturated fat, such as fatty meat. ? Sweets, desserts, sugary drinks, and other foods with added sugar. ? Full-fat dairy products.  Do not salt foods before eating.  Try to eat at least 2 vegetarian meals each week.  Eat more home-cooked food and less restaurant, buffet, and fast food.  When eating at a restaurant, ask that your food be prepared with less salt or no salt, if possible. What foods are recommended? The items listed may not be a complete list. Talk with your dietitian about what dietary choices are best for you. Grains Whole-grain or whole-wheat bread. Whole-grain or whole-wheat pasta. Brown rice. [place]. Bulgur. Whole-grain and low-sodium cereals. Pita bread. Low-fat, low-sodium crackers. Whole-wheat flour tortillas. Vegetables Fresh or frozen vegetables (raw, steamed, roasted, or grilled). Low-sodium or reduced-sodium tomato and vegetable juice. Low-sodium or reduced-sodium tomato sauce and tomato paste. Low-sodium or reduced-sodium canned vegetables. Fruits All fresh, dried, or frozen fruit. Canned fruit in natural juice (without added sugar). Meat and other protein foods Skinless chicken or [place]. Ground chicken or [place]. Pork with fat trimmed off. Fish and seafood. Egg whites. Dried beans, peas, or lentils. Unsalted  nuts, nut butters, and seeds. Unsalted canned beans. Lean cuts of beef with fat trimmed off. Low-sodium, lean deli meat. Dairy Low-fat (1%) or fat-free (skim) milk. Fat-free, low-fat, or reduced-fat cheeses. Nonfat, low-sodium ricotta or cottage cheese. Low-fat or nonfat yogurt. Low-fat, low-sodium cheese. Fats and oils Soft margarine without trans fats. Vegetable oil. Low-fat, reduced-fat, or light mayonnaise and  salad dressings (reduced-sodium). Canola, safflower, olive, soybean, and sunflower oils. Avocado. Seasoning and other foods Herbs. Spices. Seasoning mixes without salt. Unsalted popcorn and pretzels. Fat-free sweets. What foods are not recommended? The items listed may not be a complete list. Talk with your dietitian about what dietary choices are best for you. Grains Baked goods made with fat, such as croissants, muffins, or some breads. Dry pasta or rice meal packs. Vegetables Creamed or fried vegetables. Vegetables in a cheese sauce. Regular canned vegetables (not low-sodium or reduced-sodium). Regular canned tomato sauce and paste (not low-sodium or reduced-sodium). Regular tomato and vegetable juice (not low-sodium or reduced-sodium). Angie Fava. Olives. Fruits Canned fruit in a light or heavy syrup. Fried fruit. Fruit in cream or butter sauce. Meat and other protein foods Fatty cuts of meat. Ribs. Fried meat. Berniece Salines. Sausage. Bologna and other processed lunch meats. Salami. Fatback. Hotdogs. Bratwurst. Salted nuts and seeds. Canned beans with added salt. Canned or smoked fish. Whole eggs or egg yolks. Chicken or [place] with skin. Dairy Whole or 2% milk, cream, and half-and-half. Whole or full-fat cream cheese. Whole-fat or sweetened yogurt. Full-fat cheese. Nondairy creamers. Whipped toppings. Processed cheese and cheese spreads. Fats and oils Butter. Stick margarine. Lard. Shortening. Ghee. Bacon fat. Tropical oils, such as coconut, palm kernel, or palm oil. Seasoning and other  foods Salted popcorn and pretzels. Onion salt, garlic salt, seasoned salt, table salt, and sea salt. Worcestershire sauce. Tartar sauce. Barbecue sauce. Teriyaki sauce. Soy sauce, including reduced-sodium. Steak sauce. Canned and packaged gravies. Fish sauce. Oyster sauce. Cocktail sauce. Horseradish that you find on the shelf. Ketchup. Mustard. Meat flavorings and tenderizers. Bouillon cubes. Hot sauce and Tabasco sauce. Premade or packaged marinades. Premade or packaged taco seasonings. Relishes. Regular salad dressings. Where to find more information:  National Heart, Lung, and [place]: https://wilson-eaton.com/  American Heart Association: www.heart.org Summary  The DASH eating plan is a healthy eating plan that has been shown to reduce high blood pressure (hypertension). It may also reduce your risk for type 2 diabetes, heart disease, and stroke.  With the DASH eating plan, you should limit salt (sodium) intake to 2,300 mg a day. If you have hypertension, you may need to reduce your sodium intake to 1,500 mg a day.  When on the DASH eating plan, aim to eat more fresh fruits and vegetables, whole grains, lean proteins, low-fat dairy, and heart-healthy fats.  Work with your health care provider or diet and nutrition specialist (dietitian) to adjust your eating plan to your individual calorie needs. This information is not intended to replace advice given to you by your health care provider. Make sure you discuss any questions you have with your health care provider. Document Released: 05/14/2011 Document Revised: 05/18/2016 Document Reviewed: 05/18/2016 Elsevier Interactive Patient Education  2017 [employer].

## 2017-05-10 NOTE — Progress Notes (Signed)
Brenda Colon 21-Aug-1980 [id number]    History:    Presents for new patient GYN exam. Desiring pregnancy. Had annual visit with primary care in October but no Pap was done. States has a normal Pap history, last Pap several years ago. New partner. Reports positive home UPT. LMP October 27 normal cycle. Recently obtained full custody of partners 37-year-old son, son's mother sudden death from an MI. Diagnosed with hypertension in October has not taken the medication for the last  month states did not feel well on. BP normal today. History of SAB 2008.  Past medical history, past surgical history, family history and social history were all reviewed and documented in the EPIC chart. 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 Parents diabetes, sister lupus.  ROS:  A ROS was performed and pertinent positives and negatives are included.  Exam:  Vitals:   05/10/17 0843  BP: 118/78  Weight: 248 lb (112.5 kg)  Height: 5\' 8"  (1.727 m)   Body mass index is 37.71 kg/m.   General appearance:  Normal Thyroid:  Symmetrical, normal in size, without palpable masses or nodularity. Respiratory  Auscultation:  Clear without wheezing or rhonchi Cardiovascular  Auscultation:  Regular rate, without rubs, murmurs or gallops  Edema/varicosities:  Not grossly evident Abdominal  Soft,nontender, without masses, guarding or rebound.  Liver/spleen:  No organomegaly noted  Hernia:  None appreciated  Skin  Inspection:  Grossly normal   Breasts: Examined lying and sitting.     Right: Without masses, retractions, discharge or axillary adenopathy.     Left: Without masses, retractions, discharge or axillary adenopathy. Gentitourinary   Inguinal/mons:  Normal without inguinal adenopathy  External genitalia:  Normal  BUS/Urethra/Skene's glands:  Normal  Vagina:  Normal  Cervix:  Normal  Uterus:  normal in size, shape and contour.  Midline and mobile  Adnexa/parametria:     Rt: Without masses or  tenderness.   Lt: Without masses or tenderness.  Anus and perineum: Normal  Digital rectal exam: Normal sphincter tone without palpated masses or tenderness  Assessment/Plan:  36 y.o. SBF G1 P0 for new patient with positive UPT  Less than [redacted] weeks gestation per dates History of hypertension Obesity STD screen  Plan: Pap with HR HPV typing, new screening guidelines reviewed. GC/Chlamydia, declines HIV, hepatitis and RPR will help with new OB labs. Congratulations given, instructed to return to office after December 15 for viability/dating ultrasound. Reviewed we no longer deliver, will transfer care. Reviewed importance of healthy diet, safe pregnancy behaviors reviewed.    [place], 10:29 AM 05/10/2017

## 2017-05-11 ENCOUNTER — Other Ambulatory Visit: Payer: Self-pay | Admitting: Women's Health

## 2017-05-11 DIAGNOSIS — O3680X0 Pregnancy with inconclusive fetal viability, not applicable or unspecified: Secondary | ICD-10-CM

## 2017-05-11 LAB — C. TRACHOMATIS/N. GONORRHOEAE RNA
C. trachomatis RNA, TMA: NOT DETECTED
N. gonorrhoeae RNA, TMA: NOT DETECTED

## 2017-05-12 LAB — PAP, TP IMAGING W/ HPV RNA, RFLX HPV TYPE 16,18/45: HPV DNA High Risk: NOT DETECTED

## 2017-05-13 ENCOUNTER — Ambulatory Visit: Payer: Commercial Managed Care - PPO | Admitting: Nurse Practitioner

## 2017-05-20 ENCOUNTER — Encounter: Payer: Self-pay | Admitting: Women's Health

## 2017-05-20 NOTE — Telephone Encounter (Signed)
Brenda Colon, she has attached a picture of what she took. Looks like just acetaminophen.

## 2017-05-24 ENCOUNTER — Encounter: Payer: Self-pay | Admitting: Women's Health

## 2017-05-24 ENCOUNTER — Other Ambulatory Visit: Payer: Self-pay | Admitting: Women's Health

## 2017-05-24 ENCOUNTER — Ambulatory Visit (INDEPENDENT_AMBULATORY_CARE_PROVIDER_SITE_OTHER): Payer: Commercial Managed Care - PPO

## 2017-05-24 ENCOUNTER — Ambulatory Visit: Payer: Commercial Managed Care - PPO | Admitting: Women's Health

## 2017-05-24 VITALS — BP 132/80

## 2017-05-24 DIAGNOSIS — N852 Hypertrophy of uterus: Secondary | ICD-10-CM

## 2017-05-24 DIAGNOSIS — D259 Leiomyoma of uterus, unspecified: Secondary | ICD-10-CM

## 2017-05-24 DIAGNOSIS — D251 Intramural leiomyoma of uterus: Secondary | ICD-10-CM

## 2017-05-24 DIAGNOSIS — O200 Threatened abortion: Secondary | ICD-10-CM | POA: Diagnosis not present

## 2017-05-24 DIAGNOSIS — O3680X1 Pregnancy with inconclusive fetal viability, fetus 1: Secondary | ICD-10-CM

## 2017-05-24 DIAGNOSIS — O3680X0 Pregnancy with inconclusive fetal viability, not applicable or unspecified: Secondary | ICD-10-CM

## 2017-05-24 DIAGNOSIS — O3411 Maternal care for benign tumor of corpus uteri, first trimester: Secondary | ICD-10-CM

## 2017-05-24 NOTE — Patient Instructions (Signed)
First Trimester of Pregnancy The first trimester of pregnancy is from week 1 until the end of week 13 (months 1 through 3). During this time, your baby will begin to develop inside you. At 6-8 weeks, the eyes and face are formed, and the heartbeat can be seen on ultrasound. At the end of 12 weeks, all the baby's organs are formed. Prenatal care is all the medical care you receive before the birth of your baby. Make sure you get good prenatal care and follow all of your doctor's instructions. Follow these instructions at home: Medicines  Take over-the-counter and prescription medicines only as told by your doctor. Some medicines are safe and some medicines are not safe during pregnancy.  Take a prenatal vitamin that contains at least 600 micrograms (mcg) of folic acid.  If you have trouble pooping (constipation), take medicine that will make your stool soft (stool softener) if your doctor approves. Eating and drinking  Eat regular, healthy meals.  Your doctor will tell you the amount of weight gain that is right for you.  Avoid raw meat and uncooked cheese.  If you feel sick to your stomach (nauseous) or throw up (vomit): ? Eat 4 or 5 small meals a day instead of 3 large meals. ? Try eating a few soda crackers. ? Drink liquids between meals instead of during meals.  To prevent constipation: ? Eat foods that are high in fiber, like fresh fruits and vegetables, whole grains, and beans. ? Drink enough fluids to keep your pee (urine) clear or pale yellow. Activity  Exercise only as told by your doctor. Stop exercising if you have cramps or pain in your lower belly (abdomen) or low back.  Do not exercise if it is too hot, too humid, or if you are in a place of great height (high altitude).  Try to avoid standing for long periods of time. Move your legs often if you must stand in one place for a long time.  Avoid heavy lifting.  Wear low-heeled shoes. Sit and stand up straight.  You  can have sex unless your doctor tells you not to. Relieving pain and discomfort  Wear a good support bra if your breasts are sore.  Take warm water baths (sitz baths) to soothe pain or discomfort caused by hemorrhoids. Use hemorrhoid cream if your doctor says it is okay.  Rest with your legs raised if you have leg cramps or low back pain.  If you have puffy, bulging veins (varicose veins) in your legs: ? Wear support hose or compression stockings as told by your doctor. ? Raise (elevate) your feet for 15 minutes, 3-4 times a day. ? Limit salt in your food. Prenatal care  Schedule your prenatal visits by the twelfth week of pregnancy.  Write down your questions. Take them to your prenatal visits.  Keep all your prenatal visits as told by your doctor. This is important. Safety  Wear your seat belt at all times when driving.  Make a list of emergency phone numbers. The list should include numbers for family, friends, the hospital, and police and fire departments. General instructions  Ask your doctor for a referral to a local prenatal class. Begin classes no later than at the start of month 6 of your pregnancy.  Ask for help if you need counseling or if you need help with nutrition. Your doctor can give you advice or tell you where to go for help.  Do not use hot tubs, steam rooms, or   saunas.  Do not douche or use tampons or scented sanitary pads.  Do not cross your legs for long periods of time.  Avoid all herbs and alcohol. Avoid drugs that are not approved by your doctor.  Do not use any tobacco products, including cigarettes, chewing tobacco, and electronic cigarettes. If you need help quitting, ask your doctor. You may get counseling or other support to help you quit.  Avoid cat litter boxes and soil used by cats. These carry germs that can cause birth defects in the baby and can cause a loss of your baby (miscarriage) or stillbirth.  Visit your dentist. At home, brush  your teeth with a soft toothbrush. Be gentle when you floss. Contact a doctor if:  You are dizzy.  You have mild cramps or pressure in your lower belly.  You have a nagging pain in your belly area.  You continue to feel sick to your stomach, you throw up, or you have watery poop (diarrhea).  You have a bad smelling fluid coming from your vagina.  You have pain when you pee (urinate).  You have increased puffiness (swelling) in your face, hands, legs, or ankles. Get help right away if:  You have a fever.  You are leaking fluid from your vagina.  You have spotting or bleeding from your vagina.  You have very bad belly cramping or pain.  You gain or lose weight rapidly.  You throw up blood. It may look like coffee grounds.  You are around people who have German measles, fifth disease, or chickenpox.  You have a very bad headache.  You have shortness of breath.  You have any kind of trauma, such as from a fall or a car accident. Summary  The first trimester of pregnancy is from week 1 until the end of week 13 (months 1 through 3).  To take care of yourself and your unborn baby, you will need to eat healthy meals, take medicines only if your doctor tells you to do so, and do activities that are safe for you and your baby.  Keep all follow-up visits as told by your doctor. This is important as your doctor will have to ensure that your baby is healthy and growing well. This information is not intended to replace advice given to you by your health care provider. Make sure you discuss any questions you have with your health care provider. Document Released: 11/11/2007 Document Revised: 06/02/2016 Document Reviewed: 06/02/2016 Elsevier Interactive Patient Education  2017 [employer]vier Inc.  

## 2017-05-24 NOTE — Progress Notes (Signed)
36 year old SBF G2 P0 presents for viability/dating ultrasound. LMP 04/03/2017. Denies urinary symptoms, vaginal discharge, spotting, abdominal pain or fever. Has occasional nausea no vomiting.  Exam: Appears well. Pleased with pregnancy. Ultrasound: T/V and T/A anteverted uterus with right fundal intramural fibroid 11 x 9. 6 x 9 cm displaces gestational sac. Second fibroid 25 x 17 mm. Subchorionic hematoma at cervix 14 x 7 x 26 mm, prominent vascularity within uterus about gestational sac. Right ovarian corpus luteal cyst 25 mm. Left ovary seen transabdominally normal. Cervix long and closed. CRL size equal dates by LMP, FHM 156 bpm. Normal yolk sac.  Early gestation Fibroid uterus  Plan: Schedule new OB care, prenatal vitamin daily, safe pregnancy behaviors reviewed. Avoid intercourse. Congratulations given. A copy of ultrasound report given to take to first prenatal appointment. Encouraged healthy diet.

## 2017-05-26 ENCOUNTER — Ambulatory Visit: Payer: Self-pay | Admitting: Women's Health

## 2017-05-28 ENCOUNTER — Encounter (HOSPITAL_COMMUNITY): Payer: Self-pay | Admitting: Emergency Medicine

## 2017-05-28 ENCOUNTER — Emergency Department (HOSPITAL_COMMUNITY)
Admission: EM | Admit: 2017-05-28 | Discharge: 2017-05-28 | Disposition: A | Discharge status: Home / Self Care | Payer: Commercial Managed Care - PPO | Attending: Emergency Medicine | Admitting: Emergency Medicine

## 2017-05-28 DIAGNOSIS — O23591 Infection of other part of genital tract in pregnancy, first trimester: Secondary | ICD-10-CM | POA: Diagnosis not present

## 2017-05-28 DIAGNOSIS — O4690 Antepartum hemorrhage, unspecified, unspecified trimester: Secondary | ICD-10-CM

## 2017-05-28 DIAGNOSIS — N3001 Acute cystitis with hematuria: Secondary | ICD-10-CM

## 2017-05-28 DIAGNOSIS — O208 Other hemorrhage in early pregnancy: Secondary | ICD-10-CM | POA: Insufficient documentation

## 2017-05-28 DIAGNOSIS — B9689 Other specified bacterial agents as the cause of diseases classified elsewhere: Secondary | ICD-10-CM | POA: Insufficient documentation

## 2017-05-28 DIAGNOSIS — Z3A01 Less than 8 weeks gestation of pregnancy: Secondary | ICD-10-CM | POA: Insufficient documentation

## 2017-05-28 DIAGNOSIS — O10011 Pre-existing essential hypertension complicating pregnancy, first trimester: Secondary | ICD-10-CM | POA: Diagnosis not present

## 2017-05-28 DIAGNOSIS — N760 Acute vaginitis: Secondary | ICD-10-CM

## 2017-05-28 LAB — WET PREP, GENITAL
TRICH WET PREP: NONE SEEN
Yeast Wet Prep HPF POC: NONE SEEN

## 2017-05-28 LAB — GC/CHLAMYDIA PROBE AMP (~~LOC~~) NOT AT ARMC
CHLAMYDIA, DNA PROBE: NEGATIVE
Neisseria Gonorrhea: NEGATIVE

## 2017-05-28 LAB — ABO/RH: ABO/RH(D): O POS

## 2017-05-28 LAB — HCG, QUANTITATIVE, PREGNANCY: HCG, BETA CHAIN, QUANT, S: 111206 m[IU]/mL — AB (ref <5)

## 2017-05-28 LAB — URINALYSIS, ROUTINE W REFLEX MICROSCOPIC
BILIRUBIN URINE: NEGATIVE
GLUCOSE, UA: NEGATIVE mg/dL
Ketones, ur: 15 mg/dL — AB
Leukocytes, UA: NEGATIVE
NITRITE: NEGATIVE
PH: 5.5 (ref 5.0–8.0)
Protein, ur: 30 mg/dL — AB

## 2017-05-28 LAB — URINALYSIS, MICROSCOPIC (REFLEX)

## 2017-05-28 MED ORDER — CEPHALEXIN 500 MG PO CAPS: 500.0000 mg | ORAL_CAPSULE | Freq: Four times a day (QID) | ORAL | 0 refills | Status: DC

## 2017-05-28 MED ORDER — METRONIDAZOLE 500 MG PO TABS: 500.0000 mg | ORAL_TABLET | Freq: Two times a day (BID) | ORAL | 0 refills | Status: DC

## 2017-05-28 MED ORDER — CEPHALEXIN 250 MG PO CAPS
500.0000 mg | ORAL_CAPSULE | Freq: Once | ORAL | Status: AC
  Administered 2017-05-28: 500 mg via ORAL
  Filled 2017-05-28: qty 2

## 2017-05-28 MED ORDER — METRONIDAZOLE 500 MG PO TABS
500.0000 mg | ORAL_TABLET | Freq: Once | ORAL | Status: AC
  Administered 2017-05-28: 500 mg via ORAL
  Filled 2017-05-28: qty 1

## 2017-05-28 NOTE — ED Provider Notes (Signed)
[facility] EMERGENCY DEPARTMENT Provider Note   CSN: [id number] Arrival date & time: 05/28/17  0335     History   Chief Complaint Chief Complaint  Patient presents with  . Vaginal Bleeding    HPI Brenda Colon is a 36 y.o. female.  Pt presents to the ED today with vaginal bleeding.  She said she noticed some blood on the tissue paper after she used the bathroom this morning.  The pt has seen [facility] gynecology for this pregnancy.  She had an US done on 12/17 which showed her to have an IUP at 7wks 4 days.  The pt said the bleeding has stopped.  She denies any pain.        Past Medical History:  Diagnosis Date  . Hypertension   . STD (sexually transmitted disease)    [place]    Patient Active Problem List   Diagnosis Date Noted  . Hypertension 03/26/2017  . GERD with esophagitis 03/26/2017    Past Surgical History:  Procedure Laterality Date  . CYSTECTOMY     Wrist  . INDUCED ABORTION      OB History    Gravida Para Term Preterm AB Living   2       1     SAB TAB Ectopic Multiple Live Births     1             Home Medications    Prior to Admission medications   Medication Sig Start Date End Date Taking? Authorizing Provider  Prenatal Vit-Fe Fumarate-FA (PRENATAL MULTIVITAMIN) TABS tablet Take 1 tablet by mouth daily at 12 noon.   Yes [provider]  cephALEXin (KEFLEX) 500 MG capsule Take 1 capsule (500 mg total) by mouth 4 (four) times daily. 05/28/17   Isla Pence, MD  metroNIDAZOLE (FLAGYL) 500 MG tablet Take 1 tablet (500 mg total) by mouth 2 (two) times daily. 05/28/17   Isla Pence, MD    Family History Family History  Problem Relation Age of Onset  . Diabetes Mother   . Diabetes Father   . Lupus Sister     Social History Social History   Tobacco Use  . Smoking status: Never Smoker  . Smokeless tobacco: Never Used  Substance Use Topics  . Alcohol use: No    Frequency: Never  . Drug use:  No     Allergies   Patient has no known allergies.   Review of Systems Review of Systems  Genitourinary: Positive for vaginal bleeding.  All other systems reviewed and are negative.    Physical Exam Updated Vital Signs BP (!) 141/94   Pulse 75   Temp 97.9 F (36.6 C) (Oral)   Resp 18   Ht 5\' 6"  (1.676 m)   Wt 108.9 kg (240 lb)   LMP 04/03/2017   SpO2 100%   BMI 38.74 kg/m   Physical Exam  Constitutional: She is oriented to person, place, and time. She appears well-developed and well-nourished.  HENT:  Head: Normocephalic and atraumatic.  Right Ear: External ear normal.  Left Ear: External ear normal.  Nose: Nose normal.  Mouth/Throat: Oropharynx is clear and moist.  Eyes: Conjunctivae and EOM are normal. Pupils are equal, round, and reactive to light.  Neck: Normal range of motion. Neck supple.  Cardiovascular: Normal rate, regular rhythm, normal heart sounds and intact distal pulses.  Pulmonary/Chest: Effort normal and breath sounds normal.  Abdominal: Soft. Bowel sounds are normal.  Genitourinary: Cervix exhibits no motion tenderness  and no discharge. Right adnexum displays no mass. Left adnexum displays no mass. No bleeding in the vagina. No vaginal discharge found.  Musculoskeletal: Normal range of motion.  Neurological: She is alert and oriented to person, place, and time.  Skin: Skin is warm. Capillary refill takes less than 2 seconds.  Psychiatric: She has a normal mood and affect. Her behavior is normal. Judgment and thought content normal.  Nursing note and vitals reviewed.    ED Treatments / Results  Labs (all labs ordered are listed, but only abnormal results are displayed) Labs Reviewed  WET PREP, GENITAL - Abnormal; Notable for the following components:      Result Value   Clue Cells Wet Prep HPF POC PRESENT (*)    WBC, Wet Prep HPF POC MANY (*)    All other components within normal limits  HCG, QUANTITATIVE, PREGNANCY - Abnormal; Notable for  the following components:   hCG, Beta Chain, Quant, S 111,206 (*)    All other components within normal limits  URINALYSIS, ROUTINE W REFLEX MICROSCOPIC - Abnormal; Notable for the following components:   APPearance CLOUDY (*)    Specific Gravity, Urine >1.030 (*)    Hgb urine dipstick MODERATE (*)    Ketones, ur 15 (*)    Protein, ur 30 (*)    All other components within normal limits  URINALYSIS, MICROSCOPIC (REFLEX) - Abnormal; Notable for the following components:   Bacteria, UA MANY (*)    Squamous Epithelial / LPF 6-30 (*)    All other components within normal limits  URINE CULTURE  ABO/RH  GC/CHLAMYDIA PROBE AMP ([place]) NOT AT [facility]    EKG  EKG Interpretation None       Radiology No results found.         Procedures Procedures (including critical care time)  Medications Ordered in ED Medications  cephALEXin (KEFLEX) capsule 500 mg (not administered)  metroNIDAZOLE (FLAGYL) tablet 500 mg (not administered)     Initial Impression / Assessment and Plan / ED Course  I have reviewed the triage vital signs and the nursing notes.  Pertinent labs & imaging results that were available during my care of the patient were reviewed by me and considered in my medical decision making (see chart for details).   Pt will be treated for UTI and for BV.  Pt encouraged to f/u with obgyn.  Final Clinical Impressions(s) / ED Diagnoses   Final diagnoses:  Vaginal bleeding in pregnancy  Bacterial vaginosis  Acute cystitis with hematuria    ED Discharge Orders        Ordered    cephALEXin (KEFLEX) 500 MG capsule  4 times daily     05/28/17 0834    metroNIDAZOLE (FLAGYL) 500 MG tablet  2 times daily     05/28/17 0834       Isla Pence, MD 05/28/17 [phone]

## 2017-05-28 NOTE — ED Triage Notes (Signed)
Pt reports she is pregnant, just went to bathroom and noticed blood when she wiped. EDD 01/06/18. Denis abd pain, cramping etc.

## 2017-05-28 NOTE — Discharge Instructions (Addendum)
OTC probiotics and yogurt with live cultures while on antibiotics.

## 2017-05-29 LAB — URINE CULTURE: Special Requests: NORMAL

## 2017-06-08 NOTE — L&D Delivery Note (Signed)
Operative Delivery Note At 7:16 AM a viable and healthy female was delivered via Vaginal, Vacuum Neurosurgeon).  Presentation: vertex; Position: left: Occiput,, Anterior; Station: +2.  Verbal consent: obtained from patient.  Risks and benefits discussed in detail.  Risks include, but are not limited to the risks of anesthesia, bleeding, infection, damage to maternal tissues, fetal cephalhematoma.  There is also the risk of inability to effect vaginal delivery of the head, or shoulder dystocia that cannot be resolved by established maneuvers, leading to the need for emergency cesarean section.  APGAR: 9, 9; weight  .   Placenta status: , .   Cord:  with the following complications: .  Cord pH:   Anesthesia:   Instruments: Kiwi vacuum cup Episiotomy: None Lacerations: 2nd degree;Perineal Suture Repair: 3.0 monocryl Est. Blood Loss (mL): 500  Mom to postpartum.  Baby to Couplet care / Skin to Skin.  Jonnie Kind 01/01/2018, 7:44 AM

## 2017-06-16 ENCOUNTER — Encounter: Payer: Self-pay | Admitting: Women's Health

## 2017-06-17 ENCOUNTER — Other Ambulatory Visit: Payer: Self-pay | Admitting: Women's Health

## 2017-06-17 MED ORDER — METRONIDAZOLE 0.75 % VA GEL: VAGINAL | 0 refills | Status: DC

## 2017-06-24 ENCOUNTER — Other Ambulatory Visit: Payer: Self-pay

## 2017-06-24 ENCOUNTER — Inpatient Hospital Stay (HOSPITAL_COMMUNITY)
Admission: AD | Admit: 2017-06-24 | Discharge: 2017-06-24 | Disposition: A | Discharge status: Home / Self Care | Payer: Medicaid Other | Source: Ambulatory Visit | Attending: Obstetrics and Gynecology | Admitting: Obstetrics and Gynecology

## 2017-06-24 ENCOUNTER — Encounter (HOSPITAL_COMMUNITY): Payer: Self-pay | Admitting: *Deleted

## 2017-06-24 DIAGNOSIS — Z3A12 12 weeks gestation of pregnancy: Secondary | ICD-10-CM | POA: Diagnosis not present

## 2017-06-24 DIAGNOSIS — R51 Headache: Secondary | ICD-10-CM | POA: Diagnosis present

## 2017-06-24 DIAGNOSIS — O26891 Other specified pregnancy related conditions, first trimester: Secondary | ICD-10-CM | POA: Diagnosis present

## 2017-06-24 DIAGNOSIS — O26899 Other specified pregnancy related conditions, unspecified trimester: Secondary | ICD-10-CM

## 2017-06-24 DIAGNOSIS — R519 Headache, unspecified: Secondary | ICD-10-CM

## 2017-06-24 LAB — URINALYSIS, ROUTINE W REFLEX MICROSCOPIC
Bilirubin Urine: NEGATIVE
Glucose, UA: NEGATIVE mg/dL
Hgb urine dipstick: NEGATIVE
KETONES UR: NEGATIVE mg/dL
LEUKOCYTES UA: NEGATIVE
NITRITE: NEGATIVE
PH: 7 (ref 5.0–8.0)
Protein, ur: NEGATIVE mg/dL
Specific Gravity, Urine: 1.017 (ref 1.005–1.030)

## 2017-06-24 MED ORDER — BUTALBITAL-APAP-CAFFEINE 50-325-40 MG PO TABS
2.0000 | ORAL_TABLET | Freq: Once | ORAL | Status: AC
  Administered 2017-06-24: 2 via ORAL
  Filled 2017-06-24: qty 2

## 2017-06-24 MED ORDER — LABETALOL HCL 100 MG PO TABS: 100.0000 mg | ORAL_TABLET | Freq: Two times a day (BID) | ORAL | 1 refills | Status: DC

## 2017-06-24 MED ORDER — BUTALBITAL-APAP-CAFFEINE 50-325-40 MG PO TABS: 1.0000 | ORAL_TABLET | Freq: Four times a day (QID) | ORAL | 0 refills | Status: DC | PRN

## 2017-06-24 NOTE — MAU Note (Signed)
Pt presents with c/o headache x3 days, no relief with taking Acetaminophen.  Pt reports Hx CTHN, not taking meds as directed.  Hasn't taken meds since October. Denies VB or abdominal cramping.

## 2017-06-24 NOTE — MAU Note (Addendum)
Been having a migraine for 3 days.  Having some soreness in her abdomen and been having some sickness. Just finished meds for BV

## 2017-06-24 NOTE — Discharge Instructions (Signed)
Safe Medications in Pregnancy   Acne: Benzoyl Peroxide Salicylic Acid  Backache/Headache: Tylenol: 2 regular strength every 4 hours OR              2 Extra strength every 6 hours  Colds/Coughs/Allergies: Benadryl (alcohol free) 25 mg every 6 hours as needed Breath right strips Claritin Cepacol throat lozenges Chloraseptic throat spray Cold-Eeze- up to three times per day Cough drops, alcohol free Flonase (by prescription only) Guaifenesin Mucinex Robitussin DM (plain only, alcohol free) Saline nasal spray/drops Sudafed (pseudoephedrine) & Actifed ** use only after [redacted] weeks gestation and if you do not have high blood pressure Tylenol Vicks Vaporub Zinc lozenges Zyrtec   Constipation: Colace Ducolax suppositories Fleet enema Glycerin suppositories Metamucil Milk of magnesia Miralax Senokot Smooth move tea  Diarrhea: Kaopectate Imodium A-D  *NO pepto Bismol  Hemorrhoids: Anusol Anusol HC Preparation H Tucks  Indigestion: Tums Maalox Mylanta Zantac  Pepcid  Insomnia: Benadryl (alcohol free) 25mg  every 6 hours as needed Tylenol PM Unisom, no Gelcaps  Leg Cramps: Tums MagGel  Nausea/Vomiting:  Bonine Dramamine Emetrol Ginger extract Sea bands Meclizine  Nausea medication to take during pregnancy:  Unisom (doxylamine succinate 25 mg tablets) Take one tablet daily at bedtime. If symptoms are not adequately controlled, the dose can be increased to a maximum recommended dose of two tablets daily (1/2 tablet in the morning, 1/2 tablet mid-afternoon and one at bedtime). Vitamin B6 100mg  tablets. Take one tablet twice a day (up to 200 mg per day).  Skin Rashes: Aveeno products Benadryl cream or 25mg  every 6 hours as needed Calamine Lotion 1% cortisone cream  Yeast infection: Gyne-lotrimin 7 Monistat 7   **If taking multiple medications, please check labels to avoid duplicating the same active ingredients **take medication as directed on  the label ** Do not exceed 4000 mg of tylenol in 24 hours **Do not take medications that contain aspirin or ibuprofen    [place] for [employer] at [facility]       Phone: [place] for [employer] at [place] Phone: [place] for [employer] at [place]  Phone: [place] for [place] at [employer]  Phone: [place] for [place] at [place]  Phone: [facility]       Phone: [phone]  [facility]    Phone: [phone]   [facility])    Phone: [phone]  [facility]    Phone: [phone]  [facility]    Phone: [phone]  [place]    Phone: [phone]  [place] [facility]       Phone: [phone]   [place] [facility]  Phone: [place]    Phone: [phone]  [facility]   Phone: [phone]  [facility]      Phone: [phone]  [facility]    Phone: [phone]

## 2017-06-24 NOTE — MAU Provider Note (Signed)
History     CSN: [id number]  Arrival date and time: 06/24/17 1147   First Provider Initiated Contact with Patient 06/24/17 1234     Chief Complaint  Patient presents with  . Abdominal Pain  . Headache  . Nausea   HPI Junetta Hearn is a 37 y.o. G2P0010 at [redacted]w[redacted]d who presents with a headache x3 days. She rates the pain a 6/10 and tried tylenol with no relief. She states the headache is causing nausea. Denies any abdominal pain, vaginal bleeding or discharge. She has not started prenatal care yet, waiting on medicaid, but plans to go to [facility].  OB History    Gravida Para Term Preterm AB Living   2       1     SAB TAB Ectopic Multiple Live Births     1            Past Medical History:  Diagnosis Date  . Hypertension   . STD (sexually transmitted disease)    [place]    Past Surgical History:  Procedure Laterality Date  . CYSTECTOMY     Wrist  . INDUCED ABORTION      Family History  Problem Relation Age of Onset  . Diabetes Mother   . Diabetes Father   . Lupus Sister     Social History   Tobacco Use  . Smoking status: Never Smoker  . Smokeless tobacco: Never Used  Substance Use Topics  . Alcohol use: No    Frequency: Never  . Drug use: No    Allergies: No Known Allergies  Medications Prior to Admission  Medication Sig Dispense Refill Last Dose  . cephALEXin (KEFLEX) 500 MG capsule Take 1 capsule (500 mg total) by mouth 4 (four) times daily. 28 capsule 0   . metroNIDAZOLE (FLAGYL) 500 MG tablet Take 1 tablet (500 mg total) by mouth 2 (two) times daily. 14 tablet 0   . metroNIDAZOLE (METROGEL VAGINAL) 0.75 % vaginal gel 1 applicator per vagina at HS x 5 70 g 0   . Prenatal Vit-Fe Fumarate-FA (PRENATAL MULTIVITAMIN) TABS tablet Take 1 tablet by mouth daily at 12 noon.   05/26/2017    Review of Systems  Constitutional: Negative.  Negative for fatigue and fever.  HENT: Negative.   Respiratory: Negative.  Negative for shortness of breath.    Cardiovascular: Negative.  Negative for chest pain.  Gastrointestinal: Negative.  Negative for abdominal pain, constipation, diarrhea, nausea and vomiting.  Genitourinary: Negative.  Negative for dysuria.  Neurological: Positive for headaches. Negative for dizziness.   Physical Exam   Blood pressure (!) 140/99, pulse 70, temperature 98.1 F (36.7 C), temperature source Oral, resp. rate 17, weight 242 lb 3.8 oz (109.9 kg), last menstrual period 04/03/2017, SpO2 100 %.  Physical Exam  Nursing note and vitals reviewed. Constitutional: She is oriented to person, place, and time. She appears well-developed and well-nourished. No distress.  HENT:  Head: Normocephalic.  Eyes: Pupils are equal, round, and reactive to light.  Cardiovascular: Normal rate, regular rhythm and normal heart sounds.  Respiratory: Effort normal and breath sounds normal. No respiratory distress.  GI: Soft. Bowel sounds are normal. She exhibits no distension. There is no tenderness.  Neurological: She is alert and oriented to person, place, and time. She displays normal reflexes. No cranial nerve deficit. Coordination normal.  Skin: Skin is warm and dry.  Psychiatric: She has a normal mood and affect. Her behavior is normal. Judgment and thought content normal.  MAU Course  Procedures Results for orders placed or performed during the hospital encounter of 06/24/17 (from the past 24 hour(s))  Urinalysis, Routine w reflex microscopic     Status: None   Collection Time: 06/24/17 12:06 PM  Result Value Ref Range   Color, Urine YELLOW YELLOW   APPearance CLEAR CLEAR   Specific Gravity, Urine 1.017 1.005 - 1.030   pH 7.0 5.0 - 8.0   Glucose, UA NEGATIVE NEGATIVE mg/dL   Hgb urine dipstick NEGATIVE NEGATIVE   Bilirubin Urine NEGATIVE NEGATIVE   Ketones, ur NEGATIVE NEGATIVE mg/dL   Protein, ur NEGATIVE NEGATIVE mg/dL   Nitrite NEGATIVE NEGATIVE   Leukocytes, UA NEGATIVE NEGATIVE   MDM UA Fioricet- patient  reports relief from HA   Upon discharge, patient BP elevated in 150s/110s. Reviewed with Dr. Rip Harbour, will start labetalol 200mg  BID  Assessment and Plan   1. Headache in pregnancy, antepartum   2. [redacted] weeks gestation of pregnancy    -Discharge home in stable condition -Rx for fioricet given to patient  -Patient advised to follow-up with [facility] as scheduled for prenatal care -Patient may return to MAU as needed or if her condition were to change or worsen   Wende Mott CNM 06/24/2017, 12:38 PM   Allergies as of 06/24/2017   No Known Allergies     Medication List    TAKE these medications   butalbital-acetaminophen-caffeine 50-325-40 MG tablet Commonly known as:  FIORICET, ESGIC Take 1-2 tablets by mouth every 6 (six) hours as needed for headache.   cephALEXin 500 MG capsule Commonly known as:  KEFLEX Take 1 capsule (500 mg total) by mouth 4 (four) times daily.   labetalol 100 MG tablet Commonly known as:  NORMODYNE Take 1 tablet (100 mg total) by mouth 2 (two) times daily.   metroNIDAZOLE 0.75 % vaginal gel Commonly known as:  METROGEL VAGINAL 1 applicator per vagina at HS x 5   metroNIDAZOLE 500 MG tablet Commonly known as:  FLAGYL Take 1 tablet (500 mg total) by mouth 2 (two) times daily.   prenatal multivitamin Tabs tablet Take 1 tablet by mouth daily at 12 noon.

## 2017-07-04 ENCOUNTER — Inpatient Hospital Stay (HOSPITAL_COMMUNITY)
Admission: AD | Admit: 2017-07-04 | Discharge: 2017-07-05 | Disposition: A | Discharge status: Home / Self Care | Payer: Commercial Managed Care - PPO | Source: Ambulatory Visit | Attending: Family Medicine | Admitting: Family Medicine

## 2017-07-04 DIAGNOSIS — O3411 Maternal care for benign tumor of corpus uteri, first trimester: Secondary | ICD-10-CM | POA: Insufficient documentation

## 2017-07-04 DIAGNOSIS — D259 Leiomyoma of uterus, unspecified: Secondary | ICD-10-CM | POA: Insufficient documentation

## 2017-07-04 DIAGNOSIS — Z3A13 13 weeks gestation of pregnancy: Secondary | ICD-10-CM | POA: Insufficient documentation

## 2017-07-04 DIAGNOSIS — R102 Pelvic and perineal pain: Secondary | ICD-10-CM

## 2017-07-04 DIAGNOSIS — R109 Unspecified abdominal pain: Secondary | ICD-10-CM | POA: Insufficient documentation

## 2017-07-04 DIAGNOSIS — O26899 Other specified pregnancy related conditions, unspecified trimester: Secondary | ICD-10-CM

## 2017-07-04 DIAGNOSIS — Z3491 Encounter for supervision of normal pregnancy, unspecified, first trimester: Secondary | ICD-10-CM

## 2017-07-04 HISTORY — DX: Leiomyoma of uterus, unspecified: D25.9

## 2017-07-04 NOTE — MAU Note (Signed)
Pt here with c/o abdominal pain since yesterday. Denies any bleeding or leaking. Known fibroids.

## 2017-07-05 ENCOUNTER — Encounter (HOSPITAL_COMMUNITY): Payer: Self-pay

## 2017-07-05 DIAGNOSIS — R109 Unspecified abdominal pain: Secondary | ICD-10-CM | POA: Diagnosis present

## 2017-07-05 DIAGNOSIS — O3411 Maternal care for benign tumor of corpus uteri, first trimester: Secondary | ICD-10-CM

## 2017-07-05 DIAGNOSIS — D259 Leiomyoma of uterus, unspecified: Secondary | ICD-10-CM | POA: Diagnosis not present

## 2017-07-05 DIAGNOSIS — Z3A13 13 weeks gestation of pregnancy: Secondary | ICD-10-CM

## 2017-07-05 LAB — URINALYSIS, ROUTINE W REFLEX MICROSCOPIC
Bilirubin Urine: NEGATIVE
GLUCOSE, UA: NEGATIVE mg/dL
Hgb urine dipstick: NEGATIVE
Ketones, ur: 15 mg/dL — AB
LEUKOCYTES UA: NEGATIVE
Nitrite: NEGATIVE
PH: 5.5 (ref 5.0–8.0)
Protein, ur: NEGATIVE mg/dL

## 2017-07-05 NOTE — MAU Provider Note (Signed)
History     CSN: [id number]  Arrival date and time: 07/04/17 2308   First Provider Initiated Contact with Patient 07/05/17 0038      Chief Complaint  Patient presents with  . Abdominal Pain   HPI Brenda Colon is a 37 y.o. G3P0020 at [redacted]w[redacted]d who presents with abdominal pain. Symptoms began yesterday. Reports lower abdominal pain that she describes as cramping & rates 4/10. Pain is worse with standing & walking & reports that symptoms improve when she supports her lower abdomen with her hands. Has not treated symptoms. Denies n/v/d, dysuria, vaginal discharge, or vaginal bleeding. Had small  BM yesterday & reports that she feels like she's been constipated. Has not treated constipation. Has not started prenatal care yet.  Has known uterine fibroids.   OB History    Gravida Para Term Preterm AB Living   3       2     SAB TAB Ectopic Multiple Live Births   1 1            Past Medical History:  Diagnosis Date  . Hypertension   . STD (sexually transmitted disease)    [place]  . Uterine fibroid     Past Surgical History:  Procedure Laterality Date  . CYSTECTOMY     Wrist  . INDUCED ABORTION      Family History  Problem Relation Age of Onset  . Diabetes Mother   . Diabetes Father   . Lupus Sister     Social History   Tobacco Use  . Smoking status: Never Smoker  . Smokeless tobacco: Never Used  Substance Use Topics  . Alcohol use: No    Frequency: Never  . Drug use: No    Allergies: No Known Allergies  Medications Prior to Admission  Medication Sig Dispense Refill Last Dose  . butalbital-acetaminophen-caffeine (FIORICET, ESGIC) 50-325-40 MG tablet Take 1-2 tablets by mouth every 6 (six) hours as needed for headache. 20 tablet 0 Past Week at Unknown time  . labetalol (NORMODYNE) 100 MG tablet Take 1 tablet (100 mg total) by mouth 2 (two) times daily. 60 tablet 1 07/04/2017 at 2200  . cephALEXin (KEFLEX) 500 MG capsule Take 1 capsule (500 mg total) by mouth 4  (four) times daily. 28 capsule 0   . metroNIDAZOLE (FLAGYL) 500 MG tablet Take 1 tablet (500 mg total) by mouth 2 (two) times daily. 14 tablet 0   . metroNIDAZOLE (METROGEL VAGINAL) 0.75 % vaginal gel 1 applicator per vagina at HS x 5 70 g 0   . Prenatal Vit-Fe Fumarate-FA (PRENATAL MULTIVITAMIN) TABS tablet Take 1 tablet by mouth daily at 12 noon.   More than a month at Unknown time    Review of Systems  Constitutional: Negative.   Gastrointestinal: Positive for abdominal pain and constipation. Negative for diarrhea, nausea and vomiting.  Genitourinary: Negative.    Physical Exam   Blood pressure (!) 121/94, pulse 82, temperature 98.6 F (37 C), temperature source Oral, resp. rate 18, height 5\' 6"  (1.676 m), weight 241 lb (109.3 kg), last menstrual period 04/03/2017, SpO2 100 %.  Physical Exam  Nursing note and vitals reviewed. Constitutional: She is oriented to person, place, and time. She appears well-developed and well-nourished. No distress.  HENT:  Head: Normocephalic and atraumatic.  Eyes: Conjunctivae are normal. Right eye exhibits no discharge. Left eye exhibits no discharge. No scleral icterus.  Neck: Normal range of motion.  Respiratory: Effort normal. No respiratory distress.  GI: Soft. There is  no tenderness.  Genitourinary:  Genitourinary Comments: Cervix closed/thick  Neurological: She is alert and oriented to person, place, and time.  Skin: Skin is warm and dry. She is not diaphoretic.  Psychiatric: She has a normal mood and affect. Her behavior is normal. Judgment and thought content normal.    MAU Course  Procedures Results for orders placed or performed during the hospital encounter of 07/04/17 (from the past 24 hour(s))  Urinalysis, Routine w reflex microscopic     Status: Abnormal   Collection Time: 07/05/17 12:02 AM  Result Value Ref Range   Color, Urine YELLOW YELLOW   APPearance CLEAR CLEAR   Specific Gravity, Urine >1.030 (H) 1.005 - 1.030   pH 5.5  5.0 - 8.0   Glucose, UA NEGATIVE NEGATIVE mg/dL   Hgb urine dipstick NEGATIVE NEGATIVE   Bilirubin Urine NEGATIVE NEGATIVE   Ketones, ur 15 (A) NEGATIVE mg/dL   Protein, ur NEGATIVE NEGATIVE mg/dL   Nitrite NEGATIVE NEGATIVE   Leukocytes, UA NEGATIVE NEGATIVE    MDM FHT 163 Cervix closed Per u/s in December, patient has 11 cm fibroid  Assessment and Plan  A: 1. Uterine fibroids affecting pregnancy in first trimester   2. Pain of round ligament affecting pregnancy, antepartum   3. [redacted] weeks gestation of pregnancy   4. Fetal heart tones present, first trimester    P: Discharge home Increase water intake Take colace prn constipation Discussed reasons to return to MAU Start prenatal care  Jorje Guild 07/05/2017, 12:39 AM

## 2017-07-05 NOTE — Discharge Instructions (Signed)
Recommend pregnancy support belt/maternity support belt.  One brand is called Prenatal Cradle You may find these on [place].com, [employer].com, or [employer].[place] for [employer] at [facility]       Phone: [phone]  [place] for [employer] at [place] Phone: [place] for [employer] at [place]  Phone: [place] for [place] at [employer]  Phone: [place] for [place] at [place]  Phone: [facility]       Phone: [phone]  [facility]    Phone: [phone]   [facility])    Phone: [facility]    Phone: [phone]  [facility]                                     Phone: [phone]  [facility]    Phone: [place]    Phone: [phone]  [place] [facility]       Phone: [phone]   [place] [facility]  Phone: [place]    Phone: [phone]  [facility]   Phone: [phone]  [facility]      Phone: [phone]  [facility]    Phone: [phone]  Safe Medications in Pregnancy   Acne: Benzoyl Peroxide Salicylic Acid  Backache/Headache: Tylenol: 2 regular strength every 4 hours OR              2 Extra strength every 6 hours  Colds/Coughs/Allergies: Benadryl (alcohol free) 25 mg every 6 hours as needed Breath right strips Claritin Cepacol throat lozenges Chloraseptic throat spray Cold-Eeze- up to three times per day Cough drops, alcohol free Flonase (by prescription only) Guaifenesin Mucinex Robitussin DM (plain only, alcohol free) Saline nasal spray/drops Sudafed (pseudoephedrine) &  Actifed ** use only after [redacted] weeks gestation and if you do not have high blood pressure Tylenol Vicks Vaporub Zinc lozenges Zyrtec   Constipation: Colace Ducolax suppositories Fleet enema Glycerin suppositories Metamucil Milk of magnesia Miralax Senokot Smooth move tea  Diarrhea: Kaopectate Imodium A-D  *NO pepto Bismol  Hemorrhoids: Anusol Anusol HC Preparation H Tucks  Indigestion: Tums Maalox Mylanta Zantac  Pepcid  Insomnia: Benadryl (alcohol free) 25mg  every 6 hours as needed Tylenol PM Unisom, no Gelcaps  Leg Cramps: Tums MagGel  Nausea/Vomiting:  Bonine Dramamine Emetrol Ginger extract Sea bands Meclizine  Nausea medication to take during pregnancy:  Unisom (doxylamine succinate 25 mg tablets) Take one tablet daily at bedtime. If symptoms are not adequately controlled, the dose can be increased to a maximum recommended dose of two tablets daily (1/2 tablet in the morning, 1/2 tablet mid-afternoon and one at bedtime). Vitamin B6 100mg  tablets. Take one tablet twice a day (up to 200 mg per day).  Skin Rashes: Aveeno products Benadryl cream or 25mg  every 6 hours as needed Calamine Lotion 1% cortisone cream  Yeast infection: Gyne-lotrimin 7 Monistat 7  Gum/tooth pain: Anbesol  **If taking multiple medications, please check labels to avoid duplicating the same active ingredients **take medication as directed on the label ** Do not exceed 4000 mg of tylenol in 24 hours **Do not take medications that contain aspirin or ibuprofen

## 2017-07-29 ENCOUNTER — Encounter: Payer: Self-pay | Admitting: Obstetrics & Gynecology

## 2017-07-29 ENCOUNTER — Other Ambulatory Visit: Payer: Self-pay

## 2017-07-29 ENCOUNTER — Ambulatory Visit (INDEPENDENT_AMBULATORY_CARE_PROVIDER_SITE_OTHER): Payer: Medicaid Other | Admitting: Obstetrics & Gynecology

## 2017-07-29 VITALS — BP 135/99 | HR 72 | Wt 242.0 lb

## 2017-07-29 DIAGNOSIS — O0990 Supervision of high risk pregnancy, unspecified, unspecified trimester: Secondary | ICD-10-CM | POA: Insufficient documentation

## 2017-07-29 DIAGNOSIS — O10919 Unspecified pre-existing hypertension complicating pregnancy, unspecified trimester: Secondary | ICD-10-CM

## 2017-07-29 DIAGNOSIS — O0992 Supervision of high risk pregnancy, unspecified, second trimester: Secondary | ICD-10-CM

## 2017-07-29 DIAGNOSIS — I10 Essential (primary) hypertension: Secondary | ICD-10-CM

## 2017-07-29 DIAGNOSIS — Z3689 Encounter for other specified antenatal screening: Secondary | ICD-10-CM

## 2017-07-29 DIAGNOSIS — O10912 Unspecified pre-existing hypertension complicating pregnancy, second trimester: Secondary | ICD-10-CM

## 2017-07-29 LAB — POCT URINALYSIS DIP (DEVICE)
GLUCOSE, UA: NEGATIVE mg/dL
Hgb urine dipstick: NEGATIVE
NITRITE: NEGATIVE
PROTEIN: NEGATIVE mg/dL
Specific Gravity, Urine: >=1.03 (ref 1.005–1.030)
Urobilinogen, UA: 1 mg/dL (ref 0.0–1.0)
pH: 6.5 (ref 5.0–8.0)

## 2017-07-29 NOTE — Patient Instructions (Signed)
AREA PEDIATRIC/FAMILY PRACTICE PHYSICIANS   [facility] [address], [address] [place], [place]  [address] Phone - [phone]   Fax - [phone]  [facility] [address] [place], [place] [address] Phone - [phone]   Fax - [phone]  [facility] [address] [place], [place]  [address] Phone - [phone]   Fax - [phone]  [facility] [address] [place], [place]  [address] Phone - [phone]   Fax - [phone]  [facility] [address] [place], [place]  [address] Phone - [phone]   Fax - [phone]  [facility] [address] [place], [place]  [address] Phone - [phone]   Fax - [phone]  [facility] [address] [place], [place]  [address] Phone - [phone]   Fax - [phone]  [facility] [address] [place], [place]  [address] Phone - [phone]   Fax - [phone]  [facility] [address] [place], [place]  [address] Phone - [phone]   Fax - [phone] [facility] [address] [place], [place]  [address] Phone - [phone]   Fax - [phone]  [facility] [address] [place], [place]  [address] Phone - [phone]   Fax - [phone]  [facility] [address] [place], [place]  [address] Phone - [phone]   Fax - [phone]  [facility] [address] [place], [place]  [address] Phone - [phone]   Fax - [phone]  [facility] GOSR[facility] [address] [place], [place]  [address] Phone - [phone]  [facility] [address] [place], [place]  [address] Phone - [phone]   Fax - [phone]  [facility] [address] [place], [place]  [address] Phone - [phone]   Fax - [phone]  [facility] [address] [place], [place]  [address] Phone - [phone]   Fax - [phone]  [facility] [address] [place], [place]  [address] Phone - [phone]   Fax - [phone]   [facility] [address] [place], [place]  [address] Phone - [phone]   Fax - [phone]  [facility] [address] [place], [place]  [address] Phone - [phone]   Fax - [phone]  DAV[facility] RUB[facility] [address] [place], [place]  [address] Phone - [phone]   Fax - [phone]  [facility] [address] [place], [place]  [address] Phone - [phone]   Fax - [phone]  [facility] [address] [place], [place]  [address] Phone - [phone]   Fax - [phone] [facility] [address] [place], [place]  [address] Phone - [phone]   Fax - [phone]  [place] [facility] [address] [place], [place]  [address] Phone - [phone]   Fax - [phone]  [facility] [place] [address] [place],   [address] Phone - [phone]   Fax - [phone]  [facility] Charlene Flemming MD [address] [place]  [address] Phone [phone]  Fax [phone]   

## 2017-07-30 NOTE — Progress Notes (Signed)
  Subjective:    Brenda Colon is a G3P0020 [redacted]w[redacted]d being seen today for her first obstetrical visit.  Her obstetrical history is significant for advanced maternal age, obesity and large uterine fibroids. Patient does intend to breast feed. Pregnancy history fully reviewed.  Patient reports abdominal pain over fibroids.  Vitals:   07/29/17 1447  BP: (!) 135/99  Pulse: 72  Weight: 242 lb (109.8 kg)    HISTORY: OB History  Gravida Para Term Preterm AB Living  3       2    SAB TAB Ectopic Multiple Live Births  1 1          # Outcome Date GA Lbr Len/2nd Weight Sex Delivery Anes PTL Lv  3 Current           2 SAB 2008          1 TAB              Past Medical History:  Diagnosis Date  . Hypertension   . STD (sexually transmitted disease)    [place]  . Uterine fibroid    Past Surgical History:  Procedure Laterality Date  . CYSTECTOMY     Wrist  . INDUCED ABORTION     Family History  Problem Relation Age of Onset  . Diabetes Mother   . Diabetes Father   . Lupus Sister      Exam    Uterus:     Pelvic Exam:    Perineum: Not examined   Vulva: Not evaluated   Vagina:  Not examined   pH: n/a   Cervix: not examined   Adnexa: not evaluated   Bony Pelvis: n/a  System: Breast:  normal appearance, no masses or tenderness, not examined   Skin: normal coloration and turgor, no rashes    Neurologic: oriented, normal mood   Extremities: normal strength, tone, and muscle mass   HEENT sclera clear, anicteric and oropharynx clear, no lesions   Mouth/Teeth mucous membranes moist, pharynx normal without lesions and dental hygiene poor   Neck supple and no masses   Cardiovascular: regular rate and rhythm   Respiratory:  appears well, vitals normal, no respiratory distress, acyanotic, normal RR   Abdomen: Gavid, fibroids felt on funds, no rebound or guarding   Urinary: not evaluated      Assessment:    Pregnancy: [id number] Patient Active Problem List   Diagnosis Date  Noted  . Supervision of high risk pregnancy, antepartum 07/29/2017  . Hypertension 03/26/2017  . GERD with esophagitis 03/26/2017        Plan:     Initial labs drawn. Prenatal vitamins. Problem list reviewed and updated. Genetic Screening discussed--Wants NIPS and AFP.  AFP drawn today.  Pt schedule for genetics and MFM anatomy US. Pap nml in 2018 Encourage 12 pound weight gain for pregnancy RTC 4 weeks Needs baby scrips app only  HTN--pt did not take BP meds this morning.  She says her BP is nml when she does. Pt should take baby asa daily for prevention of PreE.    Silas [place] 07/30/2017

## 2017-07-31 LAB — AFP, SERUM, OPEN SPINA BIFIDA
AFP MoM: 1.33
AFP Value: 40.5 ng/mL
GEST. AGE ON COLLECTION DATE: 17 wk
Maternal Age At EDD: 37.8 yr
OSBR RISK 1 IN: 8798
TEST RESULTS AFP: NEGATIVE
Weight: 242 [lb_av]

## 2017-07-31 LAB — URINE CULTURE, OB REFLEX

## 2017-07-31 LAB — CULTURE, OB URINE

## 2017-08-05 ENCOUNTER — Ambulatory Visit: Payer: Medicaid Other

## 2017-08-05 ENCOUNTER — Ambulatory Visit: Payer: Medicaid Other | Admitting: General Practice

## 2017-08-05 VITALS — BP 114/84 | HR 84 | Ht 66.0 in | Wt 238.0 lb

## 2017-08-05 DIAGNOSIS — I10 Essential (primary) hypertension: Secondary | ICD-10-CM

## 2017-08-05 DIAGNOSIS — O0990 Supervision of high risk pregnancy, unspecified, unspecified trimester: Secondary | ICD-10-CM

## 2017-08-05 DIAGNOSIS — O09529 Supervision of elderly multigravida, unspecified trimester: Secondary | ICD-10-CM | POA: Insufficient documentation

## 2017-08-05 DIAGNOSIS — O09522 Supervision of elderly multigravida, second trimester: Secondary | ICD-10-CM

## 2017-08-05 NOTE — Progress Notes (Signed)
Patient here for BP check today. Patient denies headaches, dizziness, or blurry vision. Patient reports taking labetalol 100mg  BID. Patient will follow up at next scheduled OB visit. Panorama drawn today.

## 2017-08-06 ENCOUNTER — Encounter: Payer: Self-pay | Admitting: *Deleted

## 2017-08-06 LAB — COMPREHENSIVE METABOLIC PANEL
A/G RATIO: 1.4 (ref 1.2–2.2)
ALBUMIN: 4 g/dL (ref 3.5–5.5)
ALK PHOS: 71 IU/L (ref 39–117)
ALT: 11 IU/L (ref 0–32)
AST: 15 IU/L (ref 0–40)
BILIRUBIN TOTAL: 0.3 mg/dL (ref 0.0–1.2)
BUN / CREAT RATIO: 8 — AB (ref 9–23)
BUN: 5 mg/dL — ABNORMAL LOW (ref 6–20)
CO2: 21 mmol/L (ref 20–29)
Calcium: 9.4 mg/dL (ref 8.7–10.2)
Chloride: 105 mmol/L (ref 96–106)
Creatinine, Ser: 0.63 mg/dL (ref 0.57–1.00)
GFR calc Af Amer: 133 mL/min/{1.73_m2} (ref >59)
GFR calc non Af Amer: 115 mL/min/{1.73_m2} (ref >59)
GLOBULIN, TOTAL: 2.8 g/dL (ref 1.5–4.5)
Glucose: 67 mg/dL (ref 65–99)
POTASSIUM: 4.1 mmol/L (ref 3.5–5.2)
SODIUM: 140 mmol/L (ref 134–144)
Total Protein: 6.8 g/dL (ref 6.0–8.5)

## 2017-08-06 LAB — OBSTETRIC PANEL, INCLUDING HIV
Antibody Screen: NEGATIVE
BASOS ABS: 0 10*3/uL (ref 0.0–0.2)
Basos: 0 %
EOS (ABSOLUTE): 0 10*3/uL (ref 0.0–0.4)
EOS: 0 %
HEMOGLOBIN: 11.9 g/dL (ref 11.1–15.9)
HEP B S AG: NEGATIVE
HIV Screen 4th Generation wRfx: NONREACTIVE
Hematocrit: 36.4 % (ref 34.0–46.6)
IMMATURE GRANS (ABS): 0 10*3/uL (ref 0.0–0.1)
IMMATURE GRANULOCYTES: 0 %
LYMPHS: 32 %
Lymphocytes Absolute: 2.6 10*3/uL (ref 0.7–3.1)
MCH: 26.5 pg — ABNORMAL LOW (ref 26.6–33.0)
MCHC: 32.7 g/dL (ref 31.5–35.7)
MCV: 81 fL (ref 79–97)
Monocytes Absolute: 0.8 10*3/uL (ref 0.1–0.9)
Monocytes: 9 %
NEUTROS PCT: 59 %
Neutrophils Absolute: 4.8 10*3/uL (ref 1.4–7.0)
Platelets: 282 10*3/uL (ref 150–379)
RBC: 4.49 x10E6/uL (ref 3.77–5.28)
RDW: 15.5 % — ABNORMAL HIGH (ref 12.3–15.4)
RH TYPE: POSITIVE
RPR: NONREACTIVE
Rubella Antibodies, IGG: 9.65 index (ref >0.99)
WBC: 8.3 10*3/uL (ref 3.4–10.8)

## 2017-08-06 LAB — SMN1 COPY NUMBER ANALYSIS (SMA CARRIER SCREENING)

## 2017-08-06 LAB — PROTEIN / CREATININE RATIO, URINE
Creatinine, Urine: 296.4 mg/dL
Protein, Ur: 28.7 mg/dL
Protein/Creat Ratio: 97 mg/g creat (ref 0–200)

## 2017-08-06 LAB — CYSTIC FIBROSIS GENE TEST

## 2017-08-06 LAB — HEMOGLOBINOPATHY EVALUATION
Ferritin: 19 ng/mL (ref 15–150)
HGB SOLUBILITY: NEGATIVE
Hgb A2 Quant: 2.1 % (ref 1.8–3.2)
Hgb A: 97.9 % (ref 96.4–98.8)
Hgb C: 0 %
Hgb F Quant: 0 % (ref 0.0–2.0)
Hgb S: 0 %
Hgb Variant: 0 %

## 2017-08-06 LAB — HEMOGLOBIN A1C
Est. average glucose Bld gHb Est-mCnc: 108 mg/dL
Hgb A1c MFr Bld: 5.4 % (ref 4.8–5.6)

## 2017-08-12 ENCOUNTER — Other Ambulatory Visit: Payer: Self-pay

## 2017-08-12 ENCOUNTER — Other Ambulatory Visit (HOSPITAL_COMMUNITY): Payer: Medicaid Other

## 2017-08-13 ENCOUNTER — Encounter (HOSPITAL_COMMUNITY): Payer: Self-pay

## 2017-08-13 ENCOUNTER — Encounter: Payer: Self-pay | Admitting: *Deleted

## 2017-08-13 ENCOUNTER — Ambulatory Visit (HOSPITAL_COMMUNITY)
Admission: RE | Admit: 2017-08-13 | Discharge: 2017-08-13 | Disposition: A | Discharge status: Home / Self Care | Payer: Medicaid Other | Source: Ambulatory Visit | Attending: Obstetrics & Gynecology | Admitting: Obstetrics & Gynecology

## 2017-08-13 ENCOUNTER — Telehealth: Payer: Self-pay | Admitting: *Deleted

## 2017-08-13 ENCOUNTER — Encounter: Payer: Self-pay | Admitting: Obstetrics & Gynecology

## 2017-08-13 DIAGNOSIS — O0992 Supervision of high risk pregnancy, unspecified, second trimester: Secondary | ICD-10-CM | POA: Diagnosis not present

## 2017-08-13 DIAGNOSIS — Z3689 Encounter for other specified antenatal screening: Secondary | ICD-10-CM

## 2017-08-13 DIAGNOSIS — Z3A19 19 weeks gestation of pregnancy: Secondary | ICD-10-CM | POA: Insufficient documentation

## 2017-08-13 DIAGNOSIS — O162 Unspecified maternal hypertension, second trimester: Secondary | ICD-10-CM | POA: Insufficient documentation

## 2017-08-13 DIAGNOSIS — Z3A18 18 weeks gestation of pregnancy: Secondary | ICD-10-CM | POA: Insufficient documentation

## 2017-08-13 DIAGNOSIS — O09522 Supervision of elderly multigravida, second trimester: Secondary | ICD-10-CM

## 2017-08-13 DIAGNOSIS — O0990 Supervision of high risk pregnancy, unspecified, unspecified trimester: Secondary | ICD-10-CM

## 2017-08-13 DIAGNOSIS — O3412 Maternal care for benign tumor of corpus uteri, second trimester: Secondary | ICD-10-CM | POA: Insufficient documentation

## 2017-08-13 NOTE — Progress Notes (Signed)
Genetic Counseling  High-Risk Gestation Note  Appointment Date:  08/13/2017 Referred By: Guss Bunde, MD Date of Birth:  [birth year] Partner:  Rejeana Brock   Pregnancy History: [id number] Estimated Date of Delivery: 01/06/18 Estimated Gestational Age: [redacted]w[redacted]d Attending: Renella Cunas, MD   Ms. Nanetta Wiegman was seen for genetic counseling because of a maternal age of 37 y.o. and increased Down syndrome risk on Quad screen.      In summary:  Discussed AMA and associated risk for fetal aneuploidy  Reviewed results of Quad screen  1 in 72 Down syndrome risk  Screen negative T18 and ONTDs  Reviewed results of NIPS (Panorama)  Within normal limits, <1 in 10,000 risk for T21, T18, T13, monosomy X   Discussed options for screening  Ultrasound- performed today, see separate ultrasound report  Discussed diagnostic testing options  Amniocentesis- declined  Reviewed family history concerns   She was counseled regarding maternal age and the association with risk for chromosome conditions due to nondisjunction with aging of the ova.   We reviewed chromosomes, nondisjunction, and the associated 1 in 12 risk for fetal aneuploidy related to a maternal age of 37 y.o. at [redacted]w[redacted]d gestation.  She was counseled that the risk for aneuploidy decreases as gestational age increases, accounting for those pregnancies which spontaneously abort.  We specifically discussed Down syndrome (trisomy 56), trisomies 77 and 5, and sex chromosome aneuploidies (47,XXX and 47,XXY) including the common features and prognoses of each.   Ms. Fehrenbach have Quad screening performed through her [place] provider on 07/29/17. Results were screen positive for Down syndrome, increasing the risk from her a priori risk to 1 in 13. We reviewed methodology of Quad screen, sensitivity and specificity.  In addition, we reviewed the screen adjusted reduction in risks for trisomy 18 and open neural tube defects.  We also discussed other explanations  for a screen positive result including: a gestational dating error, differences in maternal metabolism, and normal variation. Two of the analytes on Quad screen, hCG and DIA, were very high (3.74 MoM and 3.08 MoM). This has been associated with an increased risk for growth restriction or poor pregnancy outcome later in pregnancy; therefore, we would recommend a follow up ultrasound for fetal growth in the third trimester.  Ms. Nesheim also had noninvasive prenatal screening (NIPS)/prenatal cell free DNA testing performed through her OB provider. We discussed that this screening, Panorama through [facility] laboratory, resulted today and is within normal limits for the conditions screened. We discussed that NIPS analyzes placental cell free DNA in maternal circulation to evaluate for the presence of extra chromosome conditions.  Thus, it is able to provide risk assessment for specific chromosome conditions, but is not diagnostic.  We reviewed that her results are within normal limits, showing a less than 1 in 10,000 risk for trisomies 21, 18 and 13, and monosomy X (Turner syndrome).  In addition, the risk for triploidy and sex chromosome trisomies (47,XXX and 47,XXY) was also low risk.   We reviewed that this testing identifies > 99% of pregnancies with trisomy 63, trisomy 21, sex chromosome trisomies (47,XXX and 47,XXY), and triploidy. The detection rate for trisomy 18 is 98%.  The detection rate for monosomy X is ~92%.  The false positive rate is <0.1% for all conditions. Fetal sex was not disclosed to the patient today.   She was counseled that 50-80% of fetuses with Down syndrome and up to 90% of fetuses with trisomies 35 and 18, when well visualized, have detectable anomalies  or soft markers by ultrasound.  We also discussed the availability of diagnostic testing by way of amniocentesis.  We reviewed the risks, benefits and limitations of amniocentesis including the approximate 1 in 300-500 risk for pregnancy  complications following amniocentesis. We discussed the possible results that the tests might provide including: positive, negative, unanticipated, and no result. Finally, they were counseled regarding the cost of each option and potential out of pocket expenses.  After reviewing the above results and the available options, Ms. Calzada expressed that she is not interested in pursuing diagnostic testing at this time or in the future, given the associated risk of complications.  She understands that ultrasound and NIPS cannot rule out all birth defects or genetic syndromes.    Detailed ultrasound was performed today.  The ultrasound report will be sent under separate cover. Follow-up ultrasound was recommended in 4 weeks.   Ms. Roselina Burgueno was provided with written information regarding cystic fibrosis (CF), spinal muscular atrophy (SMA) and hemoglobinopathies including the carrier frequency, availability of carrier screening and prenatal diagnosis if indicated.  In addition, we discussed that CF and hemoglobinopathies are routinely screened for as part of the [facility] newborn screening panel.  She had screening for these conditions through her OB provider, and we discussed that they were within normal limits, thus, reducing her risk to be a carrier for CF, SMA, or hemoglobinopathies.   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 The father of the pregnancy has a son, with a previous partner, with microcephaly. He is currently 38 years old and is currently in process of various evaluations. The underlying etiology is not known for his microcephaly. He was described to have mild speech delay and is currently being evaluated for possible autism, but has not received this diagnosis. He was not described to have additional physical differences or additional medical concerns. Microcephaly (small head) describes the head circumference measuring smaller than expected for age (less than  the 5%tile). Microcephaly can be seen as an isolated finding or more commonly may be associated with additional anomalies. Isolated forms of microcephaly have been observed to occur in families in both autosomal recessive and autosomal dominant patterns of inheritance. In autosomal recessive inheritance, both parents who carry one copy of a nonworking gene change have a 1 in 4 (25%) chance for each offspring to inherit two nonworking genes and thus display the condition. In autosomal dominant inheritance, a parent with the condition has a 1 in 2 (50%) risk for each offspring to also display the condition.  Microcephaly may occur secondary to mistakes or differences in brain development (brain malformations or disruptions). It can be seen as one feature in many chromosome, genetic, or sporadic syndromes, displaying various forms of inheritance. Not all single gene conditions or sporadic conditions can be tested for prenatally; many single gene conditions cannot be diagnosed until a child is evaluated after birth. Additionally, microcephaly may occur secondary to maternal exposures including intrauterine infections, alcohol, radiation, drugs, or hypoxia. Multifactorial causes (a combination of environmental and genetic factors) may also be responsible for microcephaly in some cases. Microcephaly can also occur in instances where an underlying cause cannot be identified. We discussed that recurrence risk for the current pregnancy can range depending upon the underlying cause for microcephaly in the affected individual. We discussed the limitations of prenatal screening on ultrasound for microcephaly.  Without further information regarding the provided family history, an accurate genetic risk cannot be calculated. Further genetic counseling is  warranted if more information is obtained.  Ms. Yasmene Salomone denied exposure to environmental toxins or chemical agents. She denied the use of alcohol, tobacco or street drugs.  She denied significant viral illnesses during the course of her pregnancy. Her medical and surgical histories were contributory for hypertension, for which she is treated with medication.   I counseled Ms. Ezequiel Kayser regarding the above risks and available options.  The approximate face-to-face time with the genetic counselor was 35 minutes.  Chipper Oman, MS,  Certified Genetic Counselor 08/13/2017

## 2017-08-13 NOTE — Telephone Encounter (Signed)
Received a voice mail from 08/11/17 3:56 pm from [employer] stating they are calling to confirm that Dr. Silas [place] and / or nurse received + ASP Quad screen on this patient  , resulting from 2/21/9.  If result not received , call [phone] option 2, then option 3 with specimen #[id number].  I did not see result- I called [facility] and asked them to sent to Epic chart for patient , they will also fax result.

## 2017-08-13 NOTE — Telephone Encounter (Signed)
Received fax of quad screen. Patient has genetic counseling and detail anatomy US ordered today. Called MFM to notify of abnnormal quad screen.  Called [facility] and notified her quad screen came back abnormal with increased risk down's syndrome. Explained this does not confirm her baby had Down's and that she has her appointments today in MFM. Explained on the detail [place] they will look more closely for Down's and that genetic counselor will explain all of this to her. She voices understanding.

## 2017-08-15 ENCOUNTER — Encounter: Payer: Self-pay | Admitting: Obstetrics & Gynecology

## 2017-08-15 DIAGNOSIS — D219 Benign neoplasm of connective and other soft tissue, unspecified: Secondary | ICD-10-CM | POA: Insufficient documentation

## 2017-08-16 ENCOUNTER — Other Ambulatory Visit (HOSPITAL_COMMUNITY): Payer: Self-pay | Admitting: *Deleted

## 2017-08-16 DIAGNOSIS — I10 Essential (primary) hypertension: Secondary | ICD-10-CM

## 2017-08-17 ENCOUNTER — Encounter: Payer: Self-pay | Admitting: *Deleted

## 2017-08-26 ENCOUNTER — Ambulatory Visit (INDEPENDENT_AMBULATORY_CARE_PROVIDER_SITE_OTHER): Payer: Medicaid Other | Admitting: Family Medicine

## 2017-08-26 VITALS — BP 106/84 | HR 93 | Wt 238.7 lb

## 2017-08-26 DIAGNOSIS — O0990 Supervision of high risk pregnancy, unspecified, unspecified trimester: Secondary | ICD-10-CM

## 2017-08-26 DIAGNOSIS — O10919 Unspecified pre-existing hypertension complicating pregnancy, unspecified trimester: Secondary | ICD-10-CM

## 2017-08-26 DIAGNOSIS — O09522 Supervision of elderly multigravida, second trimester: Secondary | ICD-10-CM

## 2017-08-26 LAB — POCT URINALYSIS DIP (DEVICE)
Glucose, UA: NEGATIVE mg/dL
HGB URINE DIPSTICK: NEGATIVE
NITRITE: NEGATIVE
Protein, ur: 30 mg/dL — AB
Specific Gravity, Urine: >=1.03 (ref 1.005–1.030)
UROBILINOGEN UA: 1 mg/dL (ref 0.0–1.0)
pH: 5.5 (ref 5.0–8.0)

## 2017-08-26 MED ORDER — ASPIRIN EC 81 MG PO TBEC: 81.0000 mg | DELAYED_RELEASE_TABLET | Freq: Every day | ORAL | 2 refills | Status: DC

## 2017-08-26 MED ORDER — PRENATAL GUMMIES/DHA & FA 0.4-32.5 MG PO CHEW: 1.0000 | CHEWABLE_TABLET | Freq: Every day | ORAL | 12 refills | Status: DC

## 2017-08-26 NOTE — Progress Notes (Signed)
   PRENATAL VISIT NOTE  Subjective:  Brenda Colon is a 37 y.o. G3P0020 at [redacted]w[redacted]d being seen today for ongoing prenatal care.  She is currently monitored for the following issues for this high-risk pregnancy and has Hypertension; GERD with esophagitis; Supervision of high risk pregnancy, antepartum; Advanced maternal age in multigravida; and Fibroid on their problem list.  Patient reports had episode of left thigh numbness at store. Went away after several minutes. no back pain or weakness..  Contractions: Not present. Vag. Bleeding: None.  Movement: Present. Denies leaking of fluid.   The following portions of the patient's history were reviewed and updated as appropriate: allergies, current medications, past family history, past medical history, past social history, past surgical history and problem list. Problem list updated.  Objective:   Vitals:   08/26/17 1348  BP: 106/84  Pulse: 93  Weight: 238 lb 11.2 oz (108.3 kg)    Fetal Status: Fetal Heart Rate (bpm): 154 Fundal Height: 28 cm Movement: Present     General:  Alert, oriented and cooperative. Patient is in no acute distress.  Skin: Skin is warm and dry. No rash noted.   Cardiovascular: Normal heart rate noted  Respiratory: Normal respiratory effort, no problems with respiration noted  Abdomen: Soft, gravid, appropriate for gestational age.  Pain/Pressure: Absent     Pelvic: Cervical exam deferred        Extremities: Normal range of motion.  Edema: None  Mental Status:  Normal mood and affect. Normal behavior. Normal judgment and thought content.   Assessment and Plan:  Pregnancy: [id number] at [redacted]w[redacted]d  1. Supervision of high risk pregnancy, antepartum FHT normal. Has large fundal fibroid (8x10x6cm).   2. Chronic hypertension during pregnancy, antepartum Taking labetalol and ASA Antenatal testing at 32 weeks  3. Elderly multigravida in second trimester Low risk panorama  Preterm labor symptoms and general obstetric  precautions including but not limited to vaginal bleeding, contractions, leaking of fluid and fetal movement were reviewed in detail with the patient. Please refer to After Visit Summary for other counseling recommendations.  Return in about 1 month (around 09/23/2017).   Truett Mainland, DO

## 2017-08-26 NOTE — Progress Notes (Signed)
[place] for growth scheduled on 4/5.  Pt requests Rx for Prenatal gummies

## 2017-08-27 LAB — ADD ON: HCG,UE3,INHIBIN
AFP MOM: 1.33
AFP: 40.5 ng/mL
DIA MoM: 3.08
DIA Value: 394.84 pg/mL
DSR (BY AGE)    1 IN: 155
DSR (Second Trimester) 1 IN: 69
GEST. AGE ON COLLECTION DATE: 17 wk
HCG VALUE: 87538 m[IU]/mL
Maternal Age At EDD: 37.8 yr
OSBR Risk       1 IN: 8798
Test Results:: POSITIVE — AB
UE3 VALUE: 0.72 ng/mL
WEIGHT: 242 [lb_av]
hCG MoM: 3.74
uE3 MoM: 0.78

## 2017-08-27 LAB — SPECIMEN STATUS REPORT

## 2017-08-29 NOTE — Progress Notes (Signed)
I was consulted, reviewed BP and agree w/ POC.  Tamala Julian, [place], [place] 08/29/2017 10:11 AM

## 2017-09-10 ENCOUNTER — Ambulatory Visit (HOSPITAL_COMMUNITY)
Admission: RE | Admit: 2017-09-10 | Discharge: 2017-09-10 | Disposition: A | Discharge status: Home / Self Care | Payer: Medicaid Other | Source: Ambulatory Visit | Attending: Obstetrics & Gynecology | Admitting: Obstetrics & Gynecology

## 2017-09-10 ENCOUNTER — Encounter (HOSPITAL_COMMUNITY): Payer: Self-pay

## 2017-09-10 DIAGNOSIS — Z3A22 22 weeks gestation of pregnancy: Secondary | ICD-10-CM | POA: Diagnosis not present

## 2017-09-10 DIAGNOSIS — I10 Essential (primary) hypertension: Secondary | ICD-10-CM

## 2017-09-10 DIAGNOSIS — O162 Unspecified maternal hypertension, second trimester: Secondary | ICD-10-CM | POA: Insufficient documentation

## 2017-09-10 DIAGNOSIS — O99212 Obesity complicating pregnancy, second trimester: Secondary | ICD-10-CM | POA: Diagnosis not present

## 2017-09-10 DIAGNOSIS — D259 Leiomyoma of uterus, unspecified: Secondary | ICD-10-CM | POA: Diagnosis not present

## 2017-09-10 DIAGNOSIS — O289 Unspecified abnormal findings on antenatal screening of mother: Secondary | ICD-10-CM | POA: Diagnosis not present

## 2017-09-10 DIAGNOSIS — O3412 Maternal care for benign tumor of corpus uteri, second trimester: Secondary | ICD-10-CM | POA: Insufficient documentation

## 2017-09-10 DIAGNOSIS — O09522 Supervision of elderly multigravida, second trimester: Secondary | ICD-10-CM

## 2017-09-13 ENCOUNTER — Telehealth: Payer: Self-pay | Admitting: General Practice

## 2017-09-13 ENCOUNTER — Other Ambulatory Visit (HOSPITAL_COMMUNITY): Payer: Self-pay | Admitting: *Deleted

## 2017-09-13 DIAGNOSIS — D219 Benign neoplasm of connective and other soft tissue, unspecified: Secondary | ICD-10-CM

## 2017-09-13 NOTE — Telephone Encounter (Signed)
Patient called and left message on nurse line stating she needs a prescription for allergies because her allegra isn't working. Called patient and discussed there isn't a prescription strength version of allergy medication. Recommended she try zyrtec and flonase instead of allegra based off provider recommendation. Patient verbalized understanding & had no questions

## 2017-09-14 ENCOUNTER — Encounter: Payer: Self-pay | Admitting: Obstetrics & Gynecology

## 2017-09-14 DIAGNOSIS — Z8279 Family history of other congenital malformations, deformations and chromosomal abnormalities: Secondary | ICD-10-CM | POA: Insufficient documentation

## 2017-09-20 ENCOUNTER — Ambulatory Visit (INDEPENDENT_AMBULATORY_CARE_PROVIDER_SITE_OTHER): Payer: Medicaid Other | Admitting: Obstetrics & Gynecology

## 2017-09-20 DIAGNOSIS — Z8279 Family history of other congenital malformations, deformations and chromosomal abnormalities: Secondary | ICD-10-CM

## 2017-09-20 DIAGNOSIS — O0990 Supervision of high risk pregnancy, unspecified, unspecified trimester: Secondary | ICD-10-CM

## 2017-09-20 DIAGNOSIS — O0992 Supervision of high risk pregnancy, unspecified, second trimester: Secondary | ICD-10-CM

## 2017-09-20 NOTE — Patient Instructions (Signed)
Second Trimester of Pregnancy The second trimester is from week 13 through week 28, month 4 through 6. This is often the time in pregnancy that you feel your best. Often times, morning sickness has lessened or quit. You may have more energy, and you may get hungry more often. Your unborn baby (fetus) is growing rapidly. At the end of the sixth month, he or she is about 9 inches long and weighs about 1 pounds. You will likely feel the baby move (quickening) between 18 and 20 weeks of pregnancy. Follow these instructions at home:  Avoid all smoking, herbs, and alcohol. Avoid drugs not approved by your doctor.  Do not use any tobacco products, including cigarettes, chewing tobacco, and electronic cigarettes. If you need help quitting, ask your doctor. You may get counseling or other support to help you quit.  Only take medicine as told by your doctor. Some medicines are safe and some are not during pregnancy.  Exercise only as told by your doctor. Stop exercising if you start having cramps.  Eat regular, healthy meals.  Wear a good support bra if your breasts are tender.  Do not use hot tubs, steam rooms, or saunas.  Wear your seat belt when driving.  Avoid raw meat, uncooked cheese, and liter boxes and soil used by cats.  Take your prenatal vitamins.  Take 1500-2000 milligrams of calcium daily starting at the 20th week of pregnancy until you deliver your baby.  Try taking medicine that helps you poop (stool softener) as needed, and if your doctor approves. Eat more fiber by eating fresh fruit, vegetables, and whole grains. Drink enough fluids to keep your pee (urine) clear or pale yellow.  Take warm water baths (sitz baths) to soothe pain or discomfort caused by hemorrhoids. Use hemorrhoid cream if your doctor approves.  If you have puffy, bulging veins (varicose veins), wear support hose. Raise (elevate) your feet for 15 minutes, 3-4 times a day. Limit salt in your diet.  Avoid heavy  lifting, wear low heals, and sit up straight.  Rest with your legs raised if you have leg cramps or low back pain.  Visit your dentist if you have not gone during your pregnancy. Use a soft toothbrush to brush your teeth. Be gentle when you floss.  You can have sex (intercourse) unless your doctor tells you not to.  Go to your doctor visits. Get help if:  You feel dizzy.  You have mild cramps or pressure in your lower belly (abdomen).  You have a nagging pain in your belly area.  You continue to feel sick to your stomach (nauseous), throw up (vomit), or have watery poop (diarrhea).  You have bad smelling fluid coming from your vagina.  You have pain with peeing (urination). Get help right away if:  You have a fever.  You are leaking fluid from your vagina.  You have spotting or bleeding from your vagina.  You have severe belly cramping or pain.  You lose or gain weight rapidly.  You have trouble catching your breath and have chest pain.  You notice sudden or extreme puffiness (swelling) of your face, hands, ankles, feet, or legs.  You have not felt the baby move in over an hour.  You have severe headaches that do not go away with medicine.  You have vision changes. This information is not intended to replace advice given to you by your health care provider. Make sure you discuss any questions you have with your health care   provider. Document Released: 08/19/2009 Document Revised: 10/31/2015 Document Reviewed: 07/26/2012 Elsevier Interactive Patient Education  2017 Elsevier Inc[employer]  

## 2017-09-20 NOTE — Progress Notes (Signed)
   PRENATAL VISIT NOTE  Subjective:  Zamarah Ullmer is a 37 y.o. G3P0020 at [redacted]w[redacted]d being seen today for ongoing prenatal care.  She is currently monitored for the following issues for this high-risk pregnancy and has Hypertension; GERD with esophagitis; Supervision of high risk pregnancy, antepartum; Advanced maternal age in multigravida; Fibroid; and Family history of microcephaly on their problem list.  Patient reports left anterior thigh numb, can walk ok.  Contractions: Not present. Vag. Bleeding: None.  Movement: Present. Denies leaking of fluid.   The following portions of the patient's history were reviewed and updated as appropriate: allergies, current medications, past family history, past medical history, past social history, past surgical history and problem list. Problem list updated.  Objective:   Vitals:   09/20/17 1423 09/20/17 1433  BP: (!) 133/102 (!) 129/99  Pulse: 92 98  Weight: 236 lb 8 oz (107.3 kg) 236 lb 8 oz (107.3 kg)    Fetal Status: Fetal Heart Rate (bpm): 140   Movement: Present     General:  Alert, oriented and cooperative. Patient is in no acute distress.  Skin: Skin is warm and dry. No rash noted.   Cardiovascular: Normal heart rate noted  Respiratory: Normal respiratory effort, no problems with respiration noted  Abdomen: Soft, gravid, appropriate for gestational age.  Pain/Pressure: Absent     Pelvic: Cervical exam deferred        Extremities: Normal range of motion.  Edema: None  Mental Status: Normal mood and affect. Normal behavior. Normal judgment and thought content.   Assessment and Plan:  Pregnancy: [id number] at [redacted]w[redacted]d  1. Supervision of high risk pregnancy, antepartum Stressed compliance with BP medication  2. Family history of microcephaly F/u US in 2 weeks  Preterm labor symptoms and general obstetric precautions including but not limited to vaginal bleeding, contractions, leaking of fluid and fetal movement were reviewed in detail with the  patient. Please refer to After Visit Summary for other counseling recommendations.  Return in about 3 weeks (around 10/11/2017) for 2 hr GTT.  Future Appointments  Date Time Provider [place]  10/08/2017  1:00 PM [facility] [place] 3 [facility]-MFCUS M[facility]-US    Emeterio Reeve, MD

## 2017-10-07 ENCOUNTER — Other Ambulatory Visit: Payer: Self-pay

## 2017-10-08 ENCOUNTER — Ambulatory Visit (HOSPITAL_COMMUNITY)
Admission: RE | Admit: 2017-10-08 | Discharge: 2017-10-08 | Disposition: A | Discharge status: Home / Self Care | Payer: Medicaid Other | Source: Ambulatory Visit | Attending: Obstetrics & Gynecology | Admitting: Obstetrics & Gynecology

## 2017-10-08 ENCOUNTER — Encounter (HOSPITAL_COMMUNITY): Payer: Self-pay

## 2017-10-08 DIAGNOSIS — D219 Benign neoplasm of connective and other soft tissue, unspecified: Secondary | ICD-10-CM

## 2017-10-08 DIAGNOSIS — O289 Unspecified abnormal findings on antenatal screening of mother: Secondary | ICD-10-CM | POA: Insufficient documentation

## 2017-10-08 DIAGNOSIS — Z362 Encounter for other antenatal screening follow-up: Secondary | ICD-10-CM | POA: Diagnosis not present

## 2017-10-08 DIAGNOSIS — O99212 Obesity complicating pregnancy, second trimester: Secondary | ICD-10-CM | POA: Insufficient documentation

## 2017-10-08 DIAGNOSIS — O3412 Maternal care for benign tumor of corpus uteri, second trimester: Secondary | ICD-10-CM | POA: Diagnosis not present

## 2017-10-08 DIAGNOSIS — Z3A26 26 weeks gestation of pregnancy: Secondary | ICD-10-CM | POA: Diagnosis not present

## 2017-10-08 DIAGNOSIS — O162 Unspecified maternal hypertension, second trimester: Secondary | ICD-10-CM | POA: Diagnosis not present

## 2017-10-08 DIAGNOSIS — D259 Leiomyoma of uterus, unspecified: Secondary | ICD-10-CM | POA: Diagnosis not present

## 2017-10-08 DIAGNOSIS — O09522 Supervision of elderly multigravida, second trimester: Secondary | ICD-10-CM | POA: Diagnosis present

## 2017-10-08 NOTE — Addendum Note (Signed)
Encounter addended by: Novella Rob, RDMS on: 10/08/2017 2:51 PM  Actions taken: Imaging Exam ended

## 2017-10-11 ENCOUNTER — Other Ambulatory Visit (HOSPITAL_COMMUNITY): Payer: Self-pay | Admitting: *Deleted

## 2017-10-11 DIAGNOSIS — O09523 Supervision of elderly multigravida, third trimester: Secondary | ICD-10-CM

## 2017-10-15 ENCOUNTER — Other Ambulatory Visit: Payer: Self-pay | Admitting: *Deleted

## 2017-10-15 DIAGNOSIS — O0990 Supervision of high risk pregnancy, unspecified, unspecified trimester: Secondary | ICD-10-CM

## 2017-10-15 NOTE — Addendum Note (Signed)
Addended by: Langston Reusing on: 10/15/2017 03:27 PM   Modules accepted: Orders

## 2017-10-18 ENCOUNTER — Other Ambulatory Visit: Payer: Medicaid Other

## 2017-10-18 ENCOUNTER — Encounter: Payer: Self-pay | Admitting: Obstetrics and Gynecology

## 2017-10-18 ENCOUNTER — Encounter: Payer: Medicaid Other | Admitting: Obstetrics and Gynecology

## 2017-10-18 ENCOUNTER — Encounter (INDEPENDENT_AMBULATORY_CARE_PROVIDER_SITE_OTHER): Payer: Self-pay

## 2017-10-18 NOTE — Progress Notes (Signed)
Patient did not keep OB appointment for 10/18/2017.  Durene Romans MD Attending Center for [employer])

## 2017-10-27 ENCOUNTER — Telehealth: Payer: Self-pay

## 2017-10-27 ENCOUNTER — Encounter: Payer: Self-pay | Admitting: General Practice

## 2017-10-27 NOTE — Telephone Encounter (Signed)
My chart message sent to patient to schedule appointment.

## 2017-10-27 NOTE — Telephone Encounter (Signed)
Pt called and stated that she needed to have a note to be taken out of work.  Pt is c/o pain from fibroids and numbness in her legs.

## 2017-11-05 ENCOUNTER — Ambulatory Visit (INDEPENDENT_AMBULATORY_CARE_PROVIDER_SITE_OTHER): Payer: Medicaid Other | Admitting: Family Medicine

## 2017-11-05 ENCOUNTER — Other Ambulatory Visit: Payer: Medicaid Other

## 2017-11-05 VITALS — BP 129/100 | HR 93 | Wt 231.0 lb

## 2017-11-05 DIAGNOSIS — O09529 Supervision of elderly multigravida, unspecified trimester: Secondary | ICD-10-CM

## 2017-11-05 DIAGNOSIS — O0990 Supervision of high risk pregnancy, unspecified, unspecified trimester: Secondary | ICD-10-CM

## 2017-11-05 DIAGNOSIS — I10 Essential (primary) hypertension: Secondary | ICD-10-CM

## 2017-11-05 NOTE — Progress Notes (Signed)
   PRENATAL VISIT NOTE  Subjective:  Brenda Colon is a 37 y.o. G3P0020 at [redacted]w[redacted]d being seen today for ongoing prenatal care.  She is currently monitored for the following issues for this high-risk pregnancy and has Hypertension; GERD with esophagitis; Supervision of high risk pregnancy, antepartum; Advanced maternal age in multigravida; Fibroid; and Family history of microcephaly on their problem list.  Patient reports leg cramps while working. Fatigue, chews ice..  Contractions: Not present. Vag. Bleeding: None.  Movement: Present. Denies leaking of fluid.   The following portions of the patient's history were reviewed and updated as appropriate: allergies, current medications, past family history, past medical history, past social history, past surgical history and problem list. Problem list updated.  Objective:   Vitals:   11/05/17 1135  BP: (!) 129/100  Pulse: 93  Weight: 231 lb (104.8 kg)    Fetal Status: Fetal Heart Rate (bpm): 140   Movement: Present     General:  Alert, oriented and cooperative. Patient is in no acute distress.  Skin: Skin is warm and dry. No rash noted.   Cardiovascular: Normal heart rate noted  Respiratory: Normal respiratory effort, no problems with respiration noted  Abdomen: Soft, gravid, appropriate for gestational age.  Pain/Pressure: Absent     Pelvic: Cervical exam deferred        Extremities: Normal range of motion.  Edema: Trace  Mental Status: Normal mood and affect. Normal behavior. Normal judgment and thought content.   Assessment and Plan:  Pregnancy: [id number] at [redacted]w[redacted]d  1. Supervision of high risk pregnancy, antepartum FHT and FH normal. 2hr GTT today. CBC.  2. Antepartum multigravida of advanced maternal age Low risk panorama  3. Hypertension, unspecified type Not taking labetalol Continue ASA. BPP next week and weekly.  Preterm labor symptoms and general obstetric precautions including but not limited to vaginal bleeding,  contractions, leaking of fluid and fetal movement were reviewed in detail with the patient. Please refer to After Visit Summary for other counseling recommendations.  Return in about 1 week (around 11/12/2017) for NST/BPP.  Future Appointments  Date Time Provider [place]  11/18/2017  1:15 PM Aletha Halim, MD [facility] [place]  11/19/2017  1:30 PM [place] 1 WH-MFCUS MFC-US  12/02/2017  9:15 AM [name], MD [place], DO

## 2017-11-06 LAB — GLUCOSE TOLERANCE, 2 HOURS W/ 1HR
GLUCOSE, 1 HOUR: 134 mg/dL (ref 65–179)
GLUCOSE, FASTING: 68 mg/dL (ref 65–91)
Glucose, 2 hour: 106 mg/dL (ref 65–152)

## 2017-11-06 LAB — RPR: RPR: NONREACTIVE

## 2017-11-06 LAB — CBC
Hematocrit: 32.6 % — ABNORMAL LOW (ref 34.0–46.6)
Hemoglobin: 10.2 g/dL — ABNORMAL LOW (ref 11.1–15.9)
MCH: 24.6 pg — ABNORMAL LOW (ref 26.6–33.0)
MCHC: 31.3 g/dL — AB (ref 31.5–35.7)
MCV: 79 fL (ref 79–97)
Platelets: 192 10*3/uL (ref 150–450)
RBC: 4.15 x10E6/uL (ref 3.77–5.28)
RDW: 15.9 % — AB (ref 12.3–15.4)
WBC: 8.7 10*3/uL (ref 3.4–10.8)

## 2017-11-06 LAB — HIV ANTIBODY (ROUTINE TESTING W REFLEX): HIV SCREEN 4TH GENERATION: NONREACTIVE

## 2017-11-12 ENCOUNTER — Other Ambulatory Visit: Payer: Self-pay | Admitting: Obstetrics and Gynecology

## 2017-11-12 ENCOUNTER — Encounter: Payer: Self-pay | Admitting: Obstetrics and Gynecology

## 2017-11-12 DIAGNOSIS — O99019 Anemia complicating pregnancy, unspecified trimester: Secondary | ICD-10-CM | POA: Insufficient documentation

## 2017-11-12 MED ORDER — FERROUS GLUCONATE 324 (38 FE) MG PO TABS: 324.0000 mg | ORAL_TABLET | Freq: Every day | ORAL | 1 refills | Status: DC

## 2017-11-18 ENCOUNTER — Ambulatory Visit (INDEPENDENT_AMBULATORY_CARE_PROVIDER_SITE_OTHER): Payer: Medicaid Other | Admitting: Obstetrics and Gynecology

## 2017-11-18 VITALS — BP 124/100 | HR 101 | Wt 230.7 lb

## 2017-11-18 DIAGNOSIS — O10919 Unspecified pre-existing hypertension complicating pregnancy, unspecified trimester: Secondary | ICD-10-CM

## 2017-11-18 DIAGNOSIS — O10913 Unspecified pre-existing hypertension complicating pregnancy, third trimester: Secondary | ICD-10-CM | POA: Diagnosis not present

## 2017-11-18 DIAGNOSIS — O0990 Supervision of high risk pregnancy, unspecified, unspecified trimester: Secondary | ICD-10-CM

## 2017-11-18 DIAGNOSIS — O0993 Supervision of high risk pregnancy, unspecified, third trimester: Secondary | ICD-10-CM | POA: Diagnosis not present

## 2017-11-18 DIAGNOSIS — O09523 Supervision of elderly multigravida, third trimester: Secondary | ICD-10-CM

## 2017-11-18 DIAGNOSIS — D219 Benign neoplasm of connective and other soft tissue, unspecified: Secondary | ICD-10-CM | POA: Diagnosis not present

## 2017-11-18 DIAGNOSIS — O99013 Anemia complicating pregnancy, third trimester: Secondary | ICD-10-CM

## 2017-11-18 DIAGNOSIS — D649 Anemia, unspecified: Secondary | ICD-10-CM | POA: Diagnosis not present

## 2017-11-18 LAB — POCT URINALYSIS DIP (DEVICE)
Glucose, UA: NEGATIVE mg/dL
Hgb urine dipstick: NEGATIVE
Nitrite: NEGATIVE
Protein, ur: 30 mg/dL — AB
Urobilinogen, UA: 2 mg/dL — ABNORMAL HIGH (ref 0.0–1.0)
pH: 5.5 (ref 5.0–8.0)

## 2017-11-18 MED ORDER — NIFEDIPINE ER OSMOTIC RELEASE 30 MG PO TB24: ORAL_TABLET | ORAL | 1 refills | Status: DC

## 2017-11-18 NOTE — Addendum Note (Signed)
Addended by: Langston Reusing on: 11/18/2017 01:59 PM   Modules accepted: Orders

## 2017-11-18 NOTE — Progress Notes (Signed)
Prenatal Visit Note Date: 11/18/2017 Clinic: [facility]  Subjective:  Brenda Colon is a 37 y.o. G3P0020 at [redacted]w[redacted]d being seen today for ongoing prenatal care.  She is currently monitored for the following issues for this high-risk pregnancy and has Chronic hypertension during pregnancy, antepartum; GERD with esophagitis; Supervision of high risk pregnancy, antepartum; Advanced maternal age in multigravida; Fibroid; Family history of microcephaly; and Anemia in pregnancy on their problem list.  Patient reports no complaints.   Contractions: Not present. Vag. Bleeding: None.  Movement: Present. Denies leaking of fluid.   The following portions of the patient's history were reviewed and updated as appropriate: allergies, current medications, past family history, past medical history, past social history, past surgical history and problem list. Problem list updated.  Objective:   Vitals:   11/18/17 1320 11/18/17 1322  BP: (!) 128/104 (!) 124/100  Pulse: 95 (!) 101  Weight: 230 lb 11.2 oz (104.6 kg)     Fetal Status: Fetal Heart Rate (bpm): 135   Movement: Present     General:  Alert, oriented and cooperative. Patient is in no acute distress.  Skin: Skin is warm and dry. No rash noted.   Cardiovascular: Normal heart rate noted  Respiratory: Normal respiratory effort, no problems with respiration noted  Abdomen: Soft, gravid, appropriate for gestational age. Pain/Pressure: Present     Pelvic:  Cervical exam deferred        Extremities: Normal range of motion.  Edema: Trace  Mental Status: Normal mood and affect. Normal behavior. Normal judgment and thought content.   Urinalysis:      Assessment and Plan:  Pregnancy: G3P0020 at [redacted]w[redacted]d  1. Chronic hypertension during pregnancy, antepartum Pt only on low dose asa. D/w her and she is amenable to starting procardia xl 30 qday. No s/s of pre-x and MAU precautions given. Will add on for NST today and for bpp to her growth  u/s tomorrow. Will get pre-x labs today - [place] MFM FETAL BPP WO NON STRESS; Future - Protein / creatinine ratio, urine - CBC - CMP and Liver  2. Supervision of high risk pregnancy, antepartum Unsure on bc but definitely doesn't want a btl - [place] MFM FETAL BPP WO NON STRESS; Future - Protein / creatinine ratio, urine - CBC - CMP and Liver  3. Multigravida of advanced maternal age in third trimester No current issues  4. Anemia during pregnancy in third trimester Confirms she is on iron  5. Fibroid No current issues. F/u u/s [place]  Preterm labor symptoms and general obstetric precautions including but not limited to vaginal bleeding, contractions, leaking of fluid and fetal movement were reviewed in detail with the patient. Please refer to After Visit Summary for other counseling recommendations.  Return in about 1 week (around 11/25/2017) for 7-8d hrob, nst/bpp.   Aletha Halim, MD

## 2017-11-19 ENCOUNTER — Ambulatory Visit (HOSPITAL_COMMUNITY)
Admission: RE | Admit: 2017-11-19 | Discharge: 2017-11-19 | Disposition: A | Discharge status: Home / Self Care | Payer: Medicaid Other | Source: Ambulatory Visit | Attending: Obstetrics & Gynecology | Admitting: Obstetrics & Gynecology

## 2017-11-19 ENCOUNTER — Other Ambulatory Visit (HOSPITAL_COMMUNITY): Payer: Self-pay | Admitting: Obstetrics and Gynecology

## 2017-11-19 ENCOUNTER — Telehealth: Payer: Self-pay | Admitting: Obstetrics and Gynecology

## 2017-11-19 ENCOUNTER — Encounter (HOSPITAL_COMMUNITY): Payer: Self-pay

## 2017-11-19 ENCOUNTER — Other Ambulatory Visit: Payer: Self-pay | Admitting: Obstetrics and Gynecology

## 2017-11-19 DIAGNOSIS — D259 Leiomyoma of uterus, unspecified: Secondary | ICD-10-CM

## 2017-11-19 DIAGNOSIS — O163 Unspecified maternal hypertension, third trimester: Secondary | ICD-10-CM

## 2017-11-19 DIAGNOSIS — Z362 Encounter for other antenatal screening follow-up: Secondary | ICD-10-CM

## 2017-11-19 DIAGNOSIS — O09523 Supervision of elderly multigravida, third trimester: Secondary | ICD-10-CM | POA: Diagnosis not present

## 2017-11-19 DIAGNOSIS — O99213 Obesity complicating pregnancy, third trimester: Secondary | ICD-10-CM | POA: Diagnosis not present

## 2017-11-19 DIAGNOSIS — O10919 Unspecified pre-existing hypertension complicating pregnancy, unspecified trimester: Secondary | ICD-10-CM

## 2017-11-19 DIAGNOSIS — O281 Abnormal biochemical finding on antenatal screening of mother: Secondary | ICD-10-CM

## 2017-11-19 DIAGNOSIS — O0993 Supervision of high risk pregnancy, unspecified, third trimester: Secondary | ICD-10-CM | POA: Diagnosis present

## 2017-11-19 DIAGNOSIS — Z3A33 33 weeks gestation of pregnancy: Secondary | ICD-10-CM

## 2017-11-19 DIAGNOSIS — O3413 Maternal care for benign tumor of corpus uteri, third trimester: Secondary | ICD-10-CM | POA: Diagnosis not present

## 2017-11-19 DIAGNOSIS — O10913 Unspecified pre-existing hypertension complicating pregnancy, third trimester: Secondary | ICD-10-CM | POA: Insufficient documentation

## 2017-11-19 DIAGNOSIS — Z3A32 32 weeks gestation of pregnancy: Secondary | ICD-10-CM | POA: Diagnosis not present

## 2017-11-19 DIAGNOSIS — O0990 Supervision of high risk pregnancy, unspecified, unspecified trimester: Secondary | ICD-10-CM

## 2017-11-19 LAB — PROTEIN / CREATININE RATIO, URINE
Creatinine, Urine: 385.5 mg/dL
PROTEIN UR: 55.4 mg/dL
PROTEIN/CREAT RATIO: 144 mg/g{creat} (ref 0–200)

## 2017-11-19 LAB — CMP AND LIVER
ALT: 11 IU/L (ref 0–32)
AST: 15 IU/L (ref 0–40)
Albumin: 3.1 g/dL — ABNORMAL LOW (ref 3.5–5.5)
Alkaline Phosphatase: 88 IU/L (ref 39–117)
BUN: 4 mg/dL — AB (ref 6–20)
Bilirubin Total: 0.3 mg/dL (ref 0.0–1.2)
Bilirubin, Direct: 0.14 mg/dL (ref 0.00–0.40)
CALCIUM: 9.1 mg/dL (ref 8.7–10.2)
CO2: 21 mmol/L (ref 20–29)
Chloride: 106 mmol/L (ref 96–106)
Creatinine, Ser: 0.64 mg/dL (ref 0.57–1.00)
GFR calc non Af Amer: 114 mL/min/{1.73_m2} (ref >59)
GFR, EST AFRICAN AMERICAN: 132 mL/min/{1.73_m2} (ref >59)
Glucose: 67 mg/dL (ref 65–99)
Potassium: 4.3 mmol/L (ref 3.5–5.2)
SODIUM: 138 mmol/L (ref 134–144)
TOTAL PROTEIN: 6 g/dL (ref 6.0–8.5)

## 2017-11-19 LAB — CBC
Hematocrit: 31.2 % — ABNORMAL LOW (ref 34.0–46.6)
Hemoglobin: 9.9 g/dL — ABNORMAL LOW (ref 11.1–15.9)
MCH: 24.6 pg — ABNORMAL LOW (ref 26.6–33.0)
MCHC: 31.7 g/dL (ref 31.5–35.7)
MCV: 77 fL — ABNORMAL LOW (ref 79–97)
Platelets: 173 10*3/uL (ref 150–450)
RBC: 4.03 x10E6/uL (ref 3.77–5.28)
RDW: 16.2 % — AB (ref 12.3–15.4)
WBC: 8.3 10*3/uL (ref 3.4–10.8)

## 2017-11-19 NOTE — Telephone Encounter (Signed)
MEDICAID WILL NOT PAY FOR NEW SCRIPT.  Please call in the med pt was getting before to same pharmacy.

## 2017-11-21 ENCOUNTER — Encounter: Payer: Self-pay | Admitting: Obstetrics and Gynecology

## 2017-11-21 DIAGNOSIS — R6889 Other general symptoms and signs: Secondary | ICD-10-CM | POA: Insufficient documentation

## 2017-11-25 ENCOUNTER — Ambulatory Visit (INDEPENDENT_AMBULATORY_CARE_PROVIDER_SITE_OTHER): Payer: Medicaid Other | Admitting: *Deleted

## 2017-11-25 ENCOUNTER — Ambulatory Visit: Payer: Self-pay

## 2017-11-25 ENCOUNTER — Ambulatory Visit (INDEPENDENT_AMBULATORY_CARE_PROVIDER_SITE_OTHER): Payer: Medicaid Other | Admitting: Student

## 2017-11-25 VITALS — BP 117/89 | HR 97 | Wt 232.0 lb

## 2017-11-25 DIAGNOSIS — O10919 Unspecified pre-existing hypertension complicating pregnancy, unspecified trimester: Secondary | ICD-10-CM

## 2017-11-25 DIAGNOSIS — O10913 Unspecified pre-existing hypertension complicating pregnancy, third trimester: Secondary | ICD-10-CM

## 2017-11-25 DIAGNOSIS — O0993 Supervision of high risk pregnancy, unspecified, third trimester: Secondary | ICD-10-CM

## 2017-11-25 DIAGNOSIS — O99013 Anemia complicating pregnancy, third trimester: Secondary | ICD-10-CM

## 2017-11-25 DIAGNOSIS — O0990 Supervision of high risk pregnancy, unspecified, unspecified trimester: Secondary | ICD-10-CM

## 2017-11-25 DIAGNOSIS — O09523 Supervision of elderly multigravida, third trimester: Secondary | ICD-10-CM

## 2017-11-25 NOTE — Patient Instructions (Signed)

## 2017-11-25 NOTE — Progress Notes (Signed)
Pt states she feels weak and jittery sometimes.  She is not taking any medication for hypertension at this time due to expense.

## 2017-11-25 NOTE — Progress Notes (Signed)
PRENATAL VISIT NOTE  Subjective:  Brenda Colon is a 37 y.o. G3P0020 at [redacted]w[redacted]d being seen today for ongoing prenatal care.  She is currently monitored for the following issues for this high-risk pregnancy and has Chronic hypertension during pregnancy, antepartum; GERD with esophagitis; Supervision of high risk pregnancy, antepartum; Advanced maternal age in multigravida; Fibroid; Family history of microcephaly; Anemia in pregnancy; and Large fetal head circumference on 6/14 on their problem list.  Patient reports "jitteriiness and lightheadedness" for a couple of months. She reports no syncopatic episodes. Pt. Revealed that she has not been eating meals at regular intervals. She has only had one meal today, now 3 pm.  Contractions: Not present. Vag. Bleeding: None.  Movement: Present. Denies leaking of fluid.   The following portions of the patient's history were reviewed and updated as appropriate: allergies, current medications, past family history, past medical history, past social history, past surgical history and problem list. Problem list updated.  Objective:   Vitals:   11/25/17 1421  BP: 117/89  Pulse: 97  Weight: 232 lb (105.2 kg)    Fetal Status: Fetal Heart Rate (bpm): RNST Fundal Height: 38 cm Movement: Present     General:  Alert, oriented and cooperative. Patient is in no acute distress.  Skin: Skin is warm and dry. No rash noted.   Cardiovascular: Normal heart rate noted  Respiratory: Normal respiratory effort, no problems with respiration noted  Abdomen: Soft, gravid, appropriate for gestational age.  Pain/Pressure: Absent     Pelvic: Cervical exam deferred        Extremities: Normal range of motion.     Mental Status: Normal mood and affect. Normal behavior. Normal judgment and thought content.   Assessment and Plan:  Pregnancy: [id number] at [redacted]w[redacted]d  1. Supervision of high risk pregnancy, antepartum -FH measures large, but patient had growth US showing EFW in 85% and  large HC. Will continue to monitor.   2. Chronic hypertension during pregnancy, antepartum  Pt. Ran out of BP medication. Last administration was ~end of April. Will CTM BP. 3. Multigravida of advanced maternal age in third trimester  Pt. Was educated on eating more frequent but smaller meals to include eggs, protein, frequent snacks. Patient verbalized understanding.  4. Anemia during pregnancy in third trimester Pt. Is continuing with oral Fe  Preterm labor symptoms and general obstetric precautions including but not limited to vaginal bleeding, contractions, leaking of fluid and fetal movement were reviewed in detail with the patient. Pt. Was educated on eating more frequent but smaller meals to include eggs.  Please refer to After Visit Summary for other counseling recommendations.  Return in about 1 week (around 12/02/2017), or change NST/BPP appt to 1015 (double book ok).  Future Appointments  Date Time Provider [place]  12/02/2017  9:15 AM [name], MD [facility]  12/02/2017 10:15 AM [facility] NST [facility]   Rushdan Islam, FNP student  CNM attestation:  I have seen and examined this patient and agree with above documentation in the NP students note.   Brenda Colon is a 37 y.o. G3P0020 with chronic hypertension at [redacted]w[redacted]d reporting occasional dizziness and feeling tired. Has only had breakfast today (now 3 pm). Has not taken BP meds since mid-May due to confusion about which meds to take and her refill status. BP today is normal.  +FM, denies LOF, VB, contractions, vaginal discharge.  PE: Gen: calm comfortable, NAD Resp: normal effort, no distress Heart: Regular rate Abd: Soft, NT, gravid, FH  is elevated today but patient has been getting growth scans and last EFW on 6-14 showed EFW in 85% and also large fibroids.   NST in the afternoon today.   ROS, labs, PMH reviewed  No orders of the defined types were placed in this encounter.  No orders of the  defined types were placed in this encounter.   MDM Discussed pt medications and BP with Dr. Ilda Basset, who recommends that patient hold off on starting any new BP meds but rather be seen weekly now.   Assessment: 1. Supervision of high risk pregnancy, antepartum   2. Chronic hypertension during pregnancy, antepartum   3. Multigravida of advanced maternal age in third trimester   4. Anemia during pregnancy in third trimester     Plan: -Reviewed warning signs and signs of pre-e; advised patient to eat more (every 2 hours) and drink at least 60 oz of water daily. Patient will try to eat better; she understands the importance of hydration and nutrition in pregnancy.  - Return to maternity admissions symptoms worsen -COntinue antenatal testing, baby ASA and iron.  Starr Lake, CNM 11/26/2017 5:22 PM    Starr Lake, CNM,

## 2017-11-25 NOTE — Progress Notes (Signed)

## 2017-12-02 ENCOUNTER — Ambulatory Visit (INDEPENDENT_AMBULATORY_CARE_PROVIDER_SITE_OTHER): Payer: Medicaid Other | Admitting: Obstetrics and Gynecology

## 2017-12-02 ENCOUNTER — Ambulatory Visit: Payer: Self-pay

## 2017-12-02 ENCOUNTER — Ambulatory Visit (INDEPENDENT_AMBULATORY_CARE_PROVIDER_SITE_OTHER): Payer: Medicaid Other | Admitting: General Practice

## 2017-12-02 VITALS — BP 125/97 | HR 90 | Wt 230.0 lb

## 2017-12-02 DIAGNOSIS — O10913 Unspecified pre-existing hypertension complicating pregnancy, third trimester: Secondary | ICD-10-CM | POA: Diagnosis not present

## 2017-12-02 DIAGNOSIS — O10919 Unspecified pre-existing hypertension complicating pregnancy, unspecified trimester: Secondary | ICD-10-CM

## 2017-12-02 DIAGNOSIS — O09523 Supervision of elderly multigravida, third trimester: Secondary | ICD-10-CM

## 2017-12-02 DIAGNOSIS — O0990 Supervision of high risk pregnancy, unspecified, unspecified trimester: Secondary | ICD-10-CM

## 2017-12-02 LAB — POCT URINALYSIS DIP (DEVICE)
Glucose, UA: NEGATIVE mg/dL
Hgb urine dipstick: NEGATIVE
Ketones, ur: 15 mg/dL — AB
NITRITE: NEGATIVE
PROTEIN: 30 mg/dL — AB
Specific Gravity, Urine: >=1.03 (ref 1.005–1.030)
UROBILINOGEN UA: 1 mg/dL (ref 0.0–1.0)
pH: 5.5 (ref 5.0–8.0)

## 2017-12-02 MED ORDER — NIFEDIPINE ER OSMOTIC RELEASE 30 MG PO TB24: ORAL_TABLET | ORAL | 2 refills | Status: DC

## 2017-12-02 NOTE — Patient Instructions (Signed)
Third Trimester of Pregnancy The third trimester is from week 28 through week 40 (months 7 through 9). The third trimester is a time when the unborn baby (fetus) is growing rapidly. At the end of the ninth month, the fetus is about 20 inches in length and weighs 6-10 pounds. Body changes during your third trimester Your body will continue to go through many changes during pregnancy. The changes vary from woman to woman. During the third trimester:  Your weight will continue to increase. You can expect to gain 25-35 pounds (11-16 kg) by the end of the pregnancy.  You may begin to get stretch marks on your hips, abdomen, and breasts.  You may urinate more often because the fetus is moving lower into your pelvis and pressing on your bladder.  You may develop or continue to have heartburn. This is caused by increased hormones that slow down muscles in the digestive tract.  You may develop or continue to have constipation because increased hormones slow digestion and cause the muscles that push waste through your intestines to relax.  You may develop hemorrhoids. These are swollen veins (varicose veins) in the rectum that can itch or be painful.  You may develop swollen, bulging veins (varicose veins) in your legs.  You may have increased body aches in the pelvis, back, or thighs. This is due to weight gain and increased hormones that are relaxing your joints.  You may have changes in your hair. These can include thickening of your hair, rapid growth, and changes in texture. Some women also have hair loss during or after pregnancy, or hair that feels dry or thin. Your hair will most likely return to normal after your baby is born.  Your breasts will continue to grow and they will continue to become tender. A yellow fluid (colostrum) may leak from your breasts. This is the first milk you are producing for your baby.  Your belly button may stick out.  You may notice more swelling in your hands,  face, or ankles.  You may have increased tingling or numbness in your hands, arms, and legs. The skin on your belly may also feel numb.  You may feel short of breath because of your expanding uterus.  You may have more problems sleeping. This can be caused by the size of your belly, increased need to urinate, and an increase in your body's metabolism.  You may notice the fetus "dropping," or moving lower in your abdomen (lightening).  You may have increased vaginal discharge.  You may notice your joints feel loose and you may have pain around your pelvic bone.  What to expect at prenatal visits You will have prenatal exams every 2 weeks until week 36. Then you will have weekly prenatal exams. During a routine prenatal visit:  You will be weighed to make sure you and the baby are growing normally.  Your blood pressure will be taken.  Your abdomen will be measured to track your baby's growth.  The fetal heartbeat will be listened to.  Any test results from the previous visit will be discussed.  You may have a cervical check near your due date to see if your cervix has softened or thinned (effaced).  You will be tested for Group B streptococcus. This happens between 35 and 37 weeks.  Your health care provider may ask you:  What your birth plan is.  How you are feeling.  If you are feeling the baby move.  If you have had   any abnormal symptoms, such as leaking fluid, bleeding, severe headaches, or abdominal cramping.  If you are using any tobacco products, including cigarettes, chewing tobacco, and electronic cigarettes.  If you have any questions.  Other tests or screenings that may be performed during your third trimester include:  Blood tests that check for low iron levels (anemia).  Fetal testing to check the health, activity level, and growth of the fetus. Testing is done if you have certain medical conditions or if there are problems during the  pregnancy.  Nonstress test (NST). This test checks the health of your baby to make sure there are no signs of problems, such as the baby not getting enough oxygen. During this test, a belt is placed around your belly. The baby is made to move, and its heart rate is monitored during movement.  What is false labor? False labor is a condition in which you feel small, irregular tightenings of the muscles in the womb (contractions) that usually go away with rest, changing position, or drinking water. These are called Braxton Hicks contractions. Contractions may last for hours, days, or even weeks before true labor sets in. If contractions come at regular intervals, become more frequent, increase in intensity, or become painful, you should see your health care provider. What are the signs of labor?  Abdominal cramps.  Regular contractions that start at 10 minutes apart and become stronger and more frequent with time.  Contractions that start on the top of the uterus and spread down to the lower abdomen and back.  Increased pelvic pressure and dull back pain.  A watery or bloody mucus discharge that comes from the vagina.  Leaking of amniotic fluid. This is also known as your "water breaking." It could be a slow trickle or a gush. Let your health care provider know if it has a color or strange odor. If you have any of these signs, call your health care provider right away, even if it is before your due date. Follow these instructions at home: Medicines  Follow your health care provider's instructions regarding medicine use. Specific medicines may be either safe or unsafe to take during pregnancy.  Take a prenatal vitamin that contains at least 600 micrograms (mcg) of folic acid.  If you develop constipation, try taking a stool softener if your health care provider approves. Eating and drinking  Eat a balanced diet that includes fresh fruits and vegetables, whole grains, good sources of protein  such as meat, eggs, or tofu, and low-fat dairy. Your health care provider will help you determine the amount of weight gain that is right for you.  Avoid raw meat and uncooked cheese. These carry germs that can cause birth defects in the baby.  If you have low calcium intake from food, talk to your health care provider about whether you should take a daily calcium supplement.  Eat four or five small meals rather than three large meals a day.  Limit foods that are high in fat and processed sugars, such as fried and sweet foods.  To prevent constipation: ? Drink enough fluid to keep your urine clear or pale yellow. ? Eat foods that are high in fiber, such as fresh fruits and vegetables, whole grains, and beans. Activity  Exercise only as directed by your health care provider. Most women can continue their usual exercise routine during pregnancy. Try to exercise for 30 minutes at least 5 days a week. Stop exercising if you experience uterine contractions.  Avoid heavy   lifting.  Do not exercise in extreme heat or humidity, or at high altitudes.  Wear low-heel, comfortable shoes.  Practice good posture.  You may continue to have sex unless your health care provider tells you otherwise. Relieving pain and discomfort  Take frequent breaks and rest with your legs elevated if you have leg cramps or low back pain.  Take warm sitz baths to soothe any pain or discomfort caused by hemorrhoids. Use hemorrhoid cream if your health care provider approves.  Wear a good support bra to prevent discomfort from breast tenderness.  If you develop varicose veins: ? Wear support pantyhose or compression stockings as told by your healthcare provider. ? Elevate your feet for 15 minutes, 3-4 times a day. Prenatal care  Write down your questions. Take them to your prenatal visits.  Keep all your prenatal visits as told by your health care provider. This is important. Safety  Wear your seat belt at  all times when driving.  Make a list of emergency phone numbers, including numbers for family, friends, the hospital, and police and fire departments. General instructions  Avoid cat litter boxes and soil used by cats. These carry germs that can cause birth defects in the baby. If you have a cat, ask someone to clean the litter box for you.  Do not travel far distances unless it is absolutely necessary and only with the approval of your health care provider.  Do not use hot tubs, steam rooms, or saunas.  Do not drink alcohol.  Do not use any products that contain nicotine or tobacco, such as cigarettes and e-cigarettes. If you need help quitting, ask your health care provider.  Do not use any medicinal herbs or unprescribed drugs. These chemicals affect the formation and growth of the baby.  Do not douche or use tampons or scented sanitary pads.  Do not cross your legs for long periods of time.  To prepare for the arrival of your baby: ? Take prenatal classes to understand, practice, and ask questions about labor and delivery. ? Make a trial run to the hospital. ? Visit the hospital and tour the maternity area. ? Arrange for maternity or paternity leave through employers. ? Arrange for family and friends to take care of pets while you are in the hospital. ? Purchase a rear-facing car seat and make sure you know how to install it in your car. ? Pack your hospital bag. ? Prepare the baby's nursery. Make sure to remove all pillows and stuffed animals from the baby's crib to prevent suffocation.  Visit your dentist if you have not gone during your pregnancy. Use a soft toothbrush to brush your teeth and be gentle when you floss. Contact a health care provider if:  You are unsure if you are in labor or if your water has broken.  You become dizzy.  You have mild pelvic cramps, pelvic pressure, or nagging pain in your abdominal area.  You have lower back pain.  You have persistent  nausea, vomiting, or diarrhea.  You have an unusual or bad smelling vaginal discharge.  You have pain when you urinate. Get help right away if:  Your water breaks before 37 weeks.  You have regular contractions less than 5 minutes apart before 37 weeks.  You have a fever.  You are leaking fluid from your vagina.  You have spotting or bleeding from your vagina.  You have severe abdominal pain or cramping.  You have rapid weight loss or weight gain.    You have shortness of breath with chest pain.  You notice sudden or extreme swelling of your face, hands, ankles, feet, or legs.  Your baby makes fewer than 10 movements in 2 hours.  You have severe headaches that do not go away when you take medicine.  You have vision changes. Summary  The third trimester is from week 28 through week 40, months 7 through 9. The third trimester is a time when the unborn baby (fetus) is growing rapidly.  During the third trimester, your discomfort may increase as you and your baby continue to gain weight. You may have abdominal, leg, and back pain, sleeping problems, and an increased need to urinate.  During the third trimester your breasts will keep growing and they will continue to become tender. A yellow fluid (colostrum) may leak from your breasts. This is the first milk you are producing for your baby.  False labor is a condition in which you feel small, irregular tightenings of the muscles in the womb (contractions) that eventually go away. These are called Braxton Hicks contractions. Contractions may last for hours, days, or even weeks before true labor sets in.  Signs of labor can include: abdominal cramps; regular contractions that start at 10 minutes apart and become stronger and more frequent with time; watery or bloody mucus discharge that comes from the vagina; increased pelvic pressure and dull back pain; and leaking of amniotic fluid. This information is not intended to replace advice  given to you by your health care provider. Make sure you discuss any questions you have with your health care provider. Document Released: 05/19/2001 Document Revised: 10/31/2015 Document Reviewed: 07/26/2012 Elsevier Interactive Patient Education  2017 [employer]  

## 2017-12-02 NOTE — Progress Notes (Signed)
Pt informed that the ultrasound is considered a limited OB ultrasound and is not intended to be a complete ultrasound exam.  Patient also informed that the ultrasound is not being completed with the intent of assessing for fetal or placental anomalies or any pelvic abnormalities.  Explained that the purpose of today's ultrasound is to assess for  BPP, presentation and AFI.  Patient acknowledges the purpose of the exam and the limitations of the study.    

## 2017-12-02 NOTE — Addendum Note (Signed)
Addended by: [name] on: 12/02/2017 11:22 AM   Modules accepted: Orders

## 2017-12-02 NOTE — Progress Notes (Signed)
Subjective:  Brenda Colon is a 37 y.o. G3P0020 at 60w0dbeing seen today for ongoing prenatal care.  She is currently monitored for the following issues for this high-risk pregnancy and has Chronic hypertension during pregnancy, antepartum; GERD with esophagitis; Supervision of high risk pregnancy, antepartum; Advanced maternal age in multigravida; Fibroid; Family history of microcephaly; Anemia in pregnancy; and Large fetal head circumference on 6/14 on their problem list.  Patient reports no complaints.  Contractions: Irritability. Vag. Bleeding: None.  Movement: Present. Denies leaking of fluid.   The following portions of the patient's history were reviewed and updated as appropriate: allergies, current medications, past family history, past medical history, past social history, past surgical history and problem list. Problem list updated.  Objective:   Vitals:   12/02/17 0844 12/02/17 0846  BP: (!) 122/96 (!) 125/97  Pulse: 90 90  Weight: 230 lb (104.3 kg)     Fetal Status: Fetal Heart Rate (bpm): 131   Movement: Present     General:  Alert, oriented and cooperative. Patient is in no acute distress.  Skin: Skin is warm and dry. No rash noted.   Cardiovascular: Normal heart rate noted  Respiratory: Normal respiratory effort, no problems with respiration noted  Abdomen: Soft, gravid, appropriate for gestational age. Pain/Pressure: Present     Pelvic:  Cervical exam deferred        Extremities: Normal range of motion.  Edema: Trace  Mental Status: Normal mood and affect. Normal behavior. Normal judgment and thought content.   Urinalysis:      Assessment and Plan:  Pregnancy: G3P0020 at 37w0d1. Supervision of high risk pregnancy, antepartum Stable GBS next visit  2. Chronic hypertension during pregnancy, antepartum Pt with known CHTN. Was on BP meds prior to pregnancy, does not recall name and was no compliant  Has been on BP meds with this pregnancy off/on. Pt was unsure had  to take medication and obtain refills. BP at times has been good. Today is elevated. No S/Sx of PEC. S/Sx of PEC reviewed with pt Will start Procardia XL qd. Hopefully this will help with compliance.  Medication reviewed with pt and indication for use. Antenatal testing today plus labs Continue with BASA - CBC - Comp Met (CMET) - Protein / creatinine ratio, urine U/S 6/14 85 % growth 3. Multigravida of advanced maternal age in third trimester Low risk panorama  Preterm labor symptoms and general obstetric precautions including but not limited to vaginal bleeding, contractions, leaking of fluid and fetal movement were reviewed in detail with the patient. Please refer to After Visit Summary for other counseling recommendations.  No follow-ups on file.   ErChancy MilroyMD

## 2017-12-03 LAB — COMPREHENSIVE METABOLIC PANEL
ALT: 11 IU/L (ref 0–32)
AST: 18 IU/L (ref 0–40)
Albumin/Globulin Ratio: 1.2 (ref 1.2–2.2)
Albumin: 3.3 g/dL — ABNORMAL LOW (ref 3.5–5.5)
Alkaline Phosphatase: 99 IU/L (ref 39–117)
BUN/Creatinine Ratio: 4 — ABNORMAL LOW (ref 9–23)
BUN: 3 mg/dL — ABNORMAL LOW (ref 6–20)
Bilirubin Total: 0.4 mg/dL (ref 0.0–1.2)
CALCIUM: 9.2 mg/dL (ref 8.7–10.2)
CO2: 21 mmol/L (ref 20–29)
CREATININE: 0.73 mg/dL (ref 0.57–1.00)
Chloride: 107 mmol/L — ABNORMAL HIGH (ref 96–106)
GFR, EST AFRICAN AMERICAN: 122 mL/min/{1.73_m2} (ref >59)
GFR, EST NON AFRICAN AMERICAN: 106 mL/min/{1.73_m2} (ref >59)
GLOBULIN, TOTAL: 2.8 g/dL (ref 1.5–4.5)
Glucose: 78 mg/dL (ref 65–99)
Potassium: 4.8 mmol/L (ref 3.5–5.2)
SODIUM: 142 mmol/L (ref 134–144)
Total Protein: 6.1 g/dL (ref 6.0–8.5)

## 2017-12-03 LAB — PROTEIN / CREATININE RATIO, URINE
Creatinine, Urine: 321 mg/dL
PROTEIN UR: 46.5 mg/dL
PROTEIN/CREAT RATIO: 145 mg/g{creat} (ref 0–200)

## 2017-12-03 LAB — CBC
Hematocrit: 33.9 % — ABNORMAL LOW (ref 34.0–46.6)
Hemoglobin: 10.6 g/dL — ABNORMAL LOW (ref 11.1–15.9)
MCH: 24.9 pg — ABNORMAL LOW (ref 26.6–33.0)
MCHC: 31.3 g/dL — ABNORMAL LOW (ref 31.5–35.7)
MCV: 80 fL (ref 79–97)
PLATELETS: 136 10*3/uL — AB (ref 150–450)
RBC: 4.26 x10E6/uL (ref 3.77–5.28)
RDW: 18.2 % — AB (ref 12.3–15.4)
WBC: 7.5 10*3/uL (ref 3.4–10.8)

## 2017-12-05 LAB — URINE CULTURE, OB REFLEX

## 2017-12-05 LAB — CULTURE, OB URINE

## 2017-12-07 ENCOUNTER — Telehealth: Payer: Self-pay

## 2017-12-07 ENCOUNTER — Other Ambulatory Visit: Payer: Self-pay

## 2017-12-07 MED ORDER — AMPICILLIN 500 MG PO CAPS: 500.0000 mg | ORAL_CAPSULE | Freq: Three times a day (TID) | ORAL | 0 refills | Status: DC

## 2017-12-07 NOTE — Telephone Encounter (Signed)
Left message informing patient to check mychart for results and that ampicillin was sent to pharmacy.

## 2017-12-10 ENCOUNTER — Ambulatory Visit (INDEPENDENT_AMBULATORY_CARE_PROVIDER_SITE_OTHER): Payer: Medicaid Other | Admitting: *Deleted

## 2017-12-10 ENCOUNTER — Ambulatory Visit: Payer: Self-pay

## 2017-12-10 VITALS — BP 124/94 | HR 91 | Wt 231.7 lb

## 2017-12-10 DIAGNOSIS — O10919 Unspecified pre-existing hypertension complicating pregnancy, unspecified trimester: Secondary | ICD-10-CM

## 2017-12-10 DIAGNOSIS — O10913 Unspecified pre-existing hypertension complicating pregnancy, third trimester: Secondary | ICD-10-CM | POA: Diagnosis present

## 2017-12-10 NOTE — Progress Notes (Signed)

## 2017-12-16 ENCOUNTER — Ambulatory Visit (INDEPENDENT_AMBULATORY_CARE_PROVIDER_SITE_OTHER): Payer: Medicaid Other | Admitting: Obstetrics and Gynecology

## 2017-12-16 ENCOUNTER — Ambulatory Visit: Payer: Self-pay

## 2017-12-16 ENCOUNTER — Other Ambulatory Visit (HOSPITAL_COMMUNITY)
Admission: RE | Admit: 2017-12-16 | Discharge: 2017-12-16 | Disposition: A | Discharge status: Home / Self Care | Payer: Medicaid Other | Source: Ambulatory Visit | Attending: Obstetrics and Gynecology | Admitting: Obstetrics and Gynecology

## 2017-12-16 ENCOUNTER — Ambulatory Visit (INDEPENDENT_AMBULATORY_CARE_PROVIDER_SITE_OTHER): Payer: Medicaid Other | Admitting: *Deleted

## 2017-12-16 VITALS — BP 128/87 | HR 88 | Wt 232.4 lb

## 2017-12-16 DIAGNOSIS — O2343 Unspecified infection of urinary tract in pregnancy, third trimester: Secondary | ICD-10-CM | POA: Insufficient documentation

## 2017-12-16 DIAGNOSIS — O09523 Supervision of elderly multigravida, third trimester: Secondary | ICD-10-CM | POA: Diagnosis not present

## 2017-12-16 DIAGNOSIS — O10919 Unspecified pre-existing hypertension complicating pregnancy, unspecified trimester: Secondary | ICD-10-CM

## 2017-12-16 DIAGNOSIS — Z3A37 37 weeks gestation of pregnancy: Secondary | ICD-10-CM

## 2017-12-16 DIAGNOSIS — O0993 Supervision of high risk pregnancy, unspecified, third trimester: Secondary | ICD-10-CM

## 2017-12-16 DIAGNOSIS — O10013 Pre-existing essential hypertension complicating pregnancy, third trimester: Secondary | ICD-10-CM | POA: Diagnosis not present

## 2017-12-16 DIAGNOSIS — O10913 Unspecified pre-existing hypertension complicating pregnancy, third trimester: Secondary | ICD-10-CM

## 2017-12-16 DIAGNOSIS — O0990 Supervision of high risk pregnancy, unspecified, unspecified trimester: Secondary | ICD-10-CM

## 2017-12-16 LAB — OB RESULTS CONSOLE GC/CHLAMYDIA: Gonorrhea: NEGATIVE

## 2017-12-16 LAB — OB RESULTS CONSOLE GBS: STREP GROUP B AG: POSITIVE

## 2017-12-16 MED ORDER — FOSFOMYCIN TROMETHAMINE 3 G PO PACK: 3.0000 g | PACK | Freq: Once | ORAL | 0 refills | Status: AC

## 2017-12-16 NOTE — Progress Notes (Signed)
Prenatal Visit Note Date: 12/16/2017 Clinic: [facility]  Subjective:  Brenda Colon is a 37 y.o. G3P0020 at [redacted]w[redacted]d being seen today for ongoing prenatal care.  She is currently monitored for the following issues for this high-risk pregnancy and has Chronic hypertension during pregnancy, antepartum; GERD with esophagitis; Supervision of high risk pregnancy, antepartum; Advanced maternal age in multigravida; Fibroid; Family history of microcephaly; Anemia in pregnancy; and Large fetal head circumference on 6/14 on their problem list.  Patient reports unable to keep abx pills down.   Contractions: Not present. Vag. Bleeding: None.  Movement: Present. Denies leaking of fluid.   The following portions of the patient's history were reviewed and updated as appropriate: allergies, current medications, past family history, past medical history, past social history, past surgical history and problem list. Problem list updated.  Objective:   Vitals:   12/16/17 1544  BP: 128/87  Pulse: 88  Weight: 232 lb 6.4 oz (105.4 kg)    Fetal Status: Fetal Heart Rate (bpm): NST   Movement: Present     General:  Alert, oriented and cooperative. Patient is in no acute distress.  Skin: Skin is warm and dry. No rash noted.   Cardiovascular: Normal heart rate noted  Respiratory: Normal respiratory effort, no problems with respiration noted  Abdomen: Soft, gravid, appropriate for gestational age. Pain/Pressure: Present     Pelvic:  Cervical exam deferred        Extremities: Normal range of motion.  Edema: Mild pitting, slight indentation  Mental Status: Normal mood and affect. Normal behavior. Normal judgment and thought content.   Urinalysis:      Assessment and Plan:  Pregnancy: G3P0020 at [redacted]w[redacted]d  1. Chronic hypertension during pregnancy, antepartum Confirms procardia 30 qday. Continue with meds. Non reactive nst today (140 baseline, one accel, no decel, mod variability, toco with  +irritability x 52m). F/u BPP for today. Normal 6/14 growth. Will set up for 39/0 IOL. GBS today - Culture, beta strep (group b only) - GC/Chlamydia probe amp ([facility])not at [facility] - Fetal nonstress test; Future  2. Supervision of high risk pregnancy, antepartum  3. Multigravida of advanced maternal age in third trimester  4. UTI Pan sensitive. Fosfomycin sent in  Term labor symptoms and general obstetric precautions including but not limited to vaginal bleeding, contractions, leaking of fluid and fetal movement were reviewed in detail with the patient. Please refer to After Visit Summary for other counseling recommendations.  Return for 1wk hrob/nst/bpp.   Aletha Halim, MD    Fosfomycin

## 2017-12-16 NOTE — Progress Notes (Signed)
Pt informed that the ultrasound is considered a limited OB ultrasound and is not intended to be a complete ultrasound exam.  Patient also informed that the ultrasound is not being completed with the intent of assessing for fetal or placental anomalies or any pelvic abnormalities.  Explained that the purpose of today's ultrasound is to assess for presentation, BPP and amniotic fluid volume.  Patient acknowledges the purpose of the exam and the limitations of the study.    NST initially non-reactive but baby was then very active during BPP. Therefore NST repeated following BPP and was reactive with spontaneous negative CST.

## 2017-12-19 LAB — CULTURE, BETA STREP (GROUP B ONLY): STREP GP B CULTURE: POSITIVE — AB

## 2017-12-20 LAB — GC/CHLAMYDIA PROBE AMP (~~LOC~~) NOT AT ARMC
CHLAMYDIA, DNA PROBE: NEGATIVE
NEISSERIA GONORRHEA: NEGATIVE

## 2017-12-21 ENCOUNTER — Encounter: Payer: Self-pay | Admitting: Obstetrics and Gynecology

## 2017-12-21 ENCOUNTER — Telehealth (HOSPITAL_COMMUNITY): Payer: Self-pay | Admitting: *Deleted

## 2017-12-21 DIAGNOSIS — O99820 Streptococcus B carrier state complicating pregnancy: Secondary | ICD-10-CM | POA: Insufficient documentation

## 2017-12-21 NOTE — Telephone Encounter (Signed)
Preadmission screen  

## 2017-12-24 ENCOUNTER — Ambulatory Visit (INDEPENDENT_AMBULATORY_CARE_PROVIDER_SITE_OTHER): Payer: Medicaid Other | Admitting: *Deleted

## 2017-12-24 ENCOUNTER — Ambulatory Visit (INDEPENDENT_AMBULATORY_CARE_PROVIDER_SITE_OTHER): Payer: Medicaid Other | Admitting: Advanced Practice Midwife

## 2017-12-24 ENCOUNTER — Ambulatory Visit: Payer: Self-pay

## 2017-12-24 VITALS — BP 121/88 | HR 82 | Wt 235.9 lb

## 2017-12-24 DIAGNOSIS — O10913 Unspecified pre-existing hypertension complicating pregnancy, third trimester: Secondary | ICD-10-CM

## 2017-12-24 DIAGNOSIS — O99013 Anemia complicating pregnancy, third trimester: Secondary | ICD-10-CM

## 2017-12-24 DIAGNOSIS — O0993 Supervision of high risk pregnancy, unspecified, third trimester: Secondary | ICD-10-CM

## 2017-12-24 DIAGNOSIS — O99820 Streptococcus B carrier state complicating pregnancy: Secondary | ICD-10-CM

## 2017-12-24 DIAGNOSIS — O10919 Unspecified pre-existing hypertension complicating pregnancy, unspecified trimester: Secondary | ICD-10-CM

## 2017-12-24 DIAGNOSIS — O09523 Supervision of elderly multigravida, third trimester: Secondary | ICD-10-CM

## 2017-12-24 NOTE — Progress Notes (Signed)
   PRENATAL VISIT NOTE  Subjective:  Brenda Colon is a 37 y.o. G3P0020 at [redacted]w[redacted]d being seen today for ongoing prenatal care.  She is currently monitored for the following issues for this high-risk pregnancy and has Chronic hypertension during pregnancy, antepartum; GERD with esophagitis; Supervision of high risk pregnancy, antepartum; Advanced maternal age in multigravida; Fibroid; Family history of microcephaly; Anemia in pregnancy; Large fetal head circumference on 6/14; UTI (urinary tract infection) in pregnancy in third trimester; and GBS (group B Streptococcus carrier), +RV culture, currently pregnant on their problem list.  Patient reports no complaints.  Contractions: Irregular. Vag. Bleeding: None.  Movement: Present. Denies leaking of fluid.   The following portions of the patient's history were reviewed and updated as appropriate: allergies, current medications, past family history, past medical history, past social history, past surgical history and problem list. Problem list updated.  Objective:   Vitals:   12/24/17 1116  BP: 121/88  Pulse: 82  Weight: 235 lb 14.4 oz (107 kg)    Fetal Status: Fetal Heart Rate (bpm): RNST Fundal Height: 39 cm Movement: Present     General:  Alert, oriented and cooperative. Patient is in no acute distress.  Skin: Skin is warm and dry. No rash noted.   Cardiovascular: Normal heart rate noted  Respiratory: Normal respiratory effort, no problems with respiration noted  Abdomen: Soft, gravid, appropriate for gestational age.  Pain/Pressure: Present     Pelvic: Cervical exam performed      closed/thick/posterior  Extremities: Normal range of motion.     Mental Status: Normal mood and affect. Normal behavior. Normal judgment and thought content.   Assessment and Plan:  Pregnancy: G3P0020 at [redacted]w[redacted]d  1. Supervision of high risk pregnancy, antepartum, third trimester No complaints today BPP 10/10 IOL scheduled for 0700 on 12/30/2017  2. Maternal  chronic hypertension in third trimester Taking Procardia XL 30 mg q day as prescribed  3. AMA (advanced maternal age) multigravida 14+, third trimester   4. GBS (group B Streptococcus carrier), +RV culture, currently pregnant Positive, PCN in labor   5. Anemia in pregnancy, third trimester Continues to take PNV and separate Fe supplement  Term labor symptoms and general obstetric precautions including but not limited to vaginal bleeding, contractions, leaking of fluid and fetal movement were reviewed in detail with the patient. Please refer to After Visit Summary for other counseling recommendations.  Return in about 5 weeks (around 01/28/2018) for PP visit.  IOL on 7/25.  Future Appointments  Date Time Provider [place]  12/30/2017  7:00 AM [facility] ROOM [facility] None    Darlina Rumpf, [place]  12/24/17  12:03 PM

## 2017-12-24 NOTE — Patient Instructions (Signed)
Labor Induction Labor induction is when steps are taken to cause a pregnant woman to begin the labor process. Most women go into labor on their own between 37 weeks and 42 weeks of the pregnancy. When this does not happen or when there is a medical need, methods may be used to induce labor. Labor induction causes a pregnant woman's uterus to contract. It also causes the cervix to soften (ripen), open (dilate), and thin out (efface). Usually, labor is not induced before 39 weeks of the pregnancy unless there is a problem with the baby or mother. Before inducing labor, your health care provider will consider a number of factors, including the following:  The medical condition of you and the baby.  How many weeks along you are.  The status of the baby's lung maturity.  The condition of the cervix.  The position of the baby. What are the reasons for labor induction? Labor may be induced for the following reasons:  The health of the baby or mother is at risk.  The pregnancy is overdue by 1 week or more.  The water breaks but labor does not start on its own.  The mother has a health condition or serious illness, such as high blood pressure, infection, placental abruption, or diabetes.  The amniotic fluid amounts are low around the baby.  The baby is distressed. Convenience or wanting the baby to be born on a certain date is not a reason for inducing labor. What methods are used for labor induction? Several methods of labor induction may be used, such as:  Prostaglandin medicine. This medicine causes the cervix to dilate and ripen. The medicine will also start contractions. It can be taken by mouth or by inserting a suppository into the vagina.  Inserting a thin tube (catheter) with a balloon on the end into the vagina to dilate the cervix. Once inserted, the balloon is expanded with water, which causes the cervix to open.  Stripping the membranes. Your health care provider separates  amniotic sac tissue from the cervix, causing the cervix to be stretched and causing the release of a hormone called progesterone. This may cause the uterus to contract. It is often done during an office visit. You will be sent home to wait for the contractions to begin. You will then come in for an induction.  Breaking the water. Your health care provider makes a hole in the amniotic sac using a small instrument. Once the amniotic sac breaks, contractions should begin. This may still take hours to see an effect.  Medicine to trigger or strengthen contractions. This medicine is given through an IV access tube inserted into a vein in your arm. All of the methods of induction, besides stripping the membranes, will be done in the hospital. Induction is done in the hospital so that you and the baby can be carefully monitored. How long does it take for labor to be induced? Some inductions can take up to 2-3 days. Depending on the cervix, it usually takes less time. It takes longer when you are induced early in the pregnancy or if this is your first pregnancy. If a mother is still pregnant and the induction has been going on for 2-3 days, either the mother will be sent home or a cesarean delivery will be needed. What are the risks associated with labor induction? Some of the risks of induction include:  Changes in fetal heart rate, such as too high, too low, or erratic.  Fetal distress.    Chance of infection for the mother and baby.  Increased chance of having a cesarean delivery.  Breaking off (abruption) of the placenta from the uterus (rare).  Uterine rupture (very rare). When induction is needed for medical reasons, the benefits of induction may outweigh the risks. What are some reasons for not inducing labor? Labor induction should not be done if:  It is shown that your baby does not tolerate labor.  You have had previous surgeries on your uterus, such as a myomectomy or the removal of  fibroids.  Your placenta lies very low in the uterus and blocks the opening of the cervix (placenta previa).  Your baby is not in a head-down position.  The umbilical cord drops down into the birth canal in front of the baby. This could cut off the baby's blood and oxygen supply.  You have had a previous cesarean delivery.  There are unusual circumstances, such as the baby being extremely premature. This information is not intended to replace advice given to you by your health care provider. Make sure you discuss any questions you have with your health care provider. Document Released: 10/14/2006 Document Revised: 10/31/2015 Document Reviewed: 12/22/2012 Elsevier Interactive Patient Education  2017 Elsevier Inc.  

## 2017-12-24 NOTE — Progress Notes (Signed)
IOL scheduled on 7/25 @ 0700.    Pt informed that the ultrasound is considered a limited OB ultrasound and is not intended to be a complete ultrasound exam.  Patient also informed that the ultrasound is not being completed with the intent of assessing for fetal or placental anomalies or any pelvic abnormalities.  Explained that the purpose of today's ultrasound is to assess for presentation, BPP and amniotic fluid volume.  Patient acknowledges the purpose of the exam and the limitations of the study.

## 2017-12-27 NOTE — Progress Notes (Signed)
I have reviewed the chart and agree with nursing staff's documentation of this patient's encounter.  Aletha Halim, MD 12/27/2017 1:06 PM

## 2017-12-30 ENCOUNTER — Inpatient Hospital Stay (HOSPITAL_COMMUNITY)
Admission: RE | Admit: 2017-12-30 | Discharge: 2018-01-03 | DRG: 807 | Disposition: A | Discharge status: Home / Self Care | Payer: Medicaid Other | Attending: Obstetrics and Gynecology | Admitting: Obstetrics and Gynecology

## 2017-12-30 ENCOUNTER — Encounter (HOSPITAL_COMMUNITY): Payer: Self-pay

## 2017-12-30 VITALS — BP 144/105 | HR 84 | Temp 98.1°F | Resp 18 | Ht 65.0 in | Wt 237.4 lb

## 2017-12-30 DIAGNOSIS — O9902 Anemia complicating childbirth: Secondary | ICD-10-CM | POA: Diagnosis present

## 2017-12-30 DIAGNOSIS — Z3A39 39 weeks gestation of pregnancy: Secondary | ICD-10-CM | POA: Diagnosis not present

## 2017-12-30 DIAGNOSIS — O0990 Supervision of high risk pregnancy, unspecified, unspecified trimester: Secondary | ICD-10-CM

## 2017-12-30 DIAGNOSIS — D259 Leiomyoma of uterus, unspecified: Secondary | ICD-10-CM | POA: Diagnosis present

## 2017-12-30 DIAGNOSIS — O99824 Streptococcus B carrier state complicating childbirth: Secondary | ICD-10-CM | POA: Diagnosis present

## 2017-12-30 DIAGNOSIS — O3413 Maternal care for benign tumor of corpus uteri, third trimester: Secondary | ICD-10-CM | POA: Diagnosis present

## 2017-12-30 DIAGNOSIS — O09529 Supervision of elderly multigravida, unspecified trimester: Secondary | ICD-10-CM

## 2017-12-30 DIAGNOSIS — O99820 Streptococcus B carrier state complicating pregnancy: Secondary | ICD-10-CM

## 2017-12-30 DIAGNOSIS — Z87891 Personal history of nicotine dependence: Secondary | ICD-10-CM | POA: Diagnosis not present

## 2017-12-30 DIAGNOSIS — D649 Anemia, unspecified: Secondary | ICD-10-CM | POA: Diagnosis present

## 2017-12-30 DIAGNOSIS — R6889 Other general symptoms and signs: Secondary | ICD-10-CM | POA: Diagnosis present

## 2017-12-30 DIAGNOSIS — O1002 Pre-existing essential hypertension complicating childbirth: Principal | ICD-10-CM | POA: Diagnosis present

## 2017-12-30 DIAGNOSIS — O10919 Unspecified pre-existing hypertension complicating pregnancy, unspecified trimester: Secondary | ICD-10-CM | POA: Diagnosis present

## 2017-12-30 DIAGNOSIS — O99019 Anemia complicating pregnancy, unspecified trimester: Secondary | ICD-10-CM | POA: Diagnosis present

## 2017-12-30 LAB — COMPREHENSIVE METABOLIC PANEL
ALT: 10 U/L (ref 0–44)
AST: 18 U/L (ref 15–41)
Albumin: 2.5 g/dL — ABNORMAL LOW (ref 3.5–5.0)
Alkaline Phosphatase: 103 U/L (ref 38–126)
Anion gap: 9 (ref 5–15)
BUN: 5 mg/dL — ABNORMAL LOW (ref 6–20)
CO2: 21 mmol/L — ABNORMAL LOW (ref 22–32)
Calcium: 8.8 mg/dL — ABNORMAL LOW (ref 8.9–10.3)
Chloride: 105 mmol/L (ref 98–111)
Creatinine, Ser: 0.63 mg/dL (ref 0.44–1.00)
GFR calc Af Amer: >60 mL/min (ref >60)
GFR calc non Af Amer: >60 mL/min (ref >60)
Glucose, Bld: 105 mg/dL — ABNORMAL HIGH (ref 70–99)
POTASSIUM: 4.2 mmol/L (ref 3.5–5.1)
Sodium: 135 mmol/L (ref 135–145)
Total Bilirubin: 0.4 mg/dL (ref 0.3–1.2)
Total Protein: 5.5 g/dL — ABNORMAL LOW (ref 6.5–8.1)

## 2017-12-30 LAB — URINALYSIS, COMPLETE (UACMP) WITH MICROSCOPIC
BACTERIA UA: NONE SEEN
Bilirubin Urine: NEGATIVE
Glucose, UA: 50 mg/dL — AB
HGB URINE DIPSTICK: NEGATIVE
Ketones, ur: 5 mg/dL — AB
Nitrite: NEGATIVE
PROTEIN: 30 mg/dL — AB
Specific Gravity, Urine: 1.035 — ABNORMAL HIGH (ref 1.005–1.030)
pH: 5 (ref 5.0–8.0)

## 2017-12-30 LAB — CBC
HEMATOCRIT: 33.5 % — AB (ref 36.0–46.0)
HEMOGLOBIN: 10.9 g/dL — AB (ref 12.0–15.0)
MCH: 25.9 pg — AB (ref 26.0–34.0)
MCHC: 32.5 g/dL (ref 30.0–36.0)
MCV: 79.6 fL (ref 78.0–100.0)
Platelets: 151 10*3/uL (ref 150–400)
RBC: 4.21 MIL/uL (ref 3.87–5.11)
RDW: 19.7 % — ABNORMAL HIGH (ref 11.5–15.5)
WBC: 8.1 10*3/uL (ref 4.0–10.5)

## 2017-12-30 LAB — TYPE AND SCREEN
ABO/RH(D): O POS
Antibody Screen: NEGATIVE

## 2017-12-30 LAB — PROTEIN / CREATININE RATIO, URINE
Creatinine, Urine: 325 mg/dL
Protein Creatinine Ratio: 0.11 mg/mg{Cre} (ref 0.00–0.15)
Total Protein, Urine: 35 mg/dL

## 2017-12-30 LAB — RPR: RPR Ser Ql: NONREACTIVE

## 2017-12-30 MED ORDER — FENTANYL CITRATE (PF) 100 MCG/2ML IJ SOLN
100.0000 ug | INTRAMUSCULAR | Status: DC | PRN
  Administered 2017-12-31 (×3): 100 ug via INTRAVENOUS
  Filled 2017-12-30 (×3): qty 2

## 2017-12-30 MED ORDER — LIDOCAINE HCL (PF) 1 % IJ SOLN
30.0000 mL | INTRAMUSCULAR | Status: DC | PRN
  Filled 2017-12-30: qty 30

## 2017-12-30 MED ORDER — SOD CITRATE-CITRIC ACID 500-334 MG/5ML PO SOLN: 30.0000 mL | ORAL | Status: DC | PRN

## 2017-12-30 MED ORDER — LACTATED RINGERS IV SOLN
INTRAVENOUS | Status: DC
Start: 2017-12-30 — End: 2018-01-01
  Administered 2017-12-30 – 2017-12-31 (×3): via INTRAVENOUS
  Administered 2017-12-31: 125 mL/h via INTRAVENOUS

## 2017-12-30 MED ORDER — MISOPROSTOL 25 MCG QUARTER TABLET
25.0000 ug | ORAL_TABLET | ORAL | Status: DC | PRN
  Administered 2017-12-30 – 2017-12-31 (×2): 25 ug via ORAL
  Filled 2017-12-30 (×2): qty 1

## 2017-12-30 MED ORDER — OXYTOCIN 40 UNITS IN LACTATED RINGERS INFUSION - SIMPLE MED
2.5000 [IU]/h | INTRAVENOUS | Status: DC
  Administered 2018-01-01: 2.5 [IU]/h via INTRAVENOUS
  Filled 2017-12-30: qty 1000

## 2017-12-30 MED ORDER — NIFEDIPINE ER OSMOTIC RELEASE 30 MG PO TB24
30.0000 mg | ORAL_TABLET | Freq: Every day | ORAL | Status: DC
  Administered 2017-12-30 – 2017-12-31 (×2): 30 mg via ORAL
  Filled 2017-12-30 (×3): qty 1

## 2017-12-30 MED ORDER — ACETAMINOPHEN 325 MG PO TABS: 650.0000 mg | ORAL_TABLET | ORAL | Status: DC | PRN

## 2017-12-30 MED ORDER — OXYTOCIN BOLUS FROM INFUSION
500.0000 mL | Freq: Once | INTRAVENOUS | Status: AC
  Administered 2018-01-01: 500 mL via INTRAVENOUS

## 2017-12-30 MED ORDER — TERBUTALINE SULFATE 1 MG/ML IJ SOLN
0.2500 mg | Freq: Once | INTRAMUSCULAR | Status: AC | PRN
  Administered 2018-01-01: 0.25 mg via SUBCUTANEOUS
  Filled 2017-12-30: qty 1

## 2017-12-30 MED ORDER — ZOLPIDEM TARTRATE 5 MG PO TABS
5.0000 mg | ORAL_TABLET | Freq: Every evening | ORAL | Status: DC | PRN
  Administered 2017-12-30: 5 mg via ORAL
  Filled 2017-12-30: qty 1

## 2017-12-30 MED ORDER — OXYCODONE-ACETAMINOPHEN 5-325 MG PO TABS: 2.0000 | ORAL_TABLET | ORAL | Status: DC | PRN

## 2017-12-30 MED ORDER — SODIUM CHLORIDE 0.9 % IV SOLN
5.0000 10*6.[IU] | Freq: Once | INTRAVENOUS | Status: AC
  Administered 2017-12-30: 5 10*6.[IU] via INTRAVENOUS
  Filled 2017-12-30: qty 5

## 2017-12-30 MED ORDER — PENICILLIN G POT IN DEXTROSE 60000 UNIT/ML IV SOLN
3.0000 10*6.[IU] | INTRAVENOUS | Status: DC
  Administered 2017-12-30 – 2018-01-01 (×11): 3 10*6.[IU] via INTRAVENOUS
  Filled 2017-12-30 (×13): qty 50

## 2017-12-30 MED ORDER — ONDANSETRON HCL 4 MG/2ML IJ SOLN: 4.0000 mg | Freq: Four times a day (QID) | INTRAMUSCULAR | Status: DC | PRN

## 2017-12-30 MED ORDER — MISOPROSTOL 25 MCG QUARTER TABLET
25.0000 ug | ORAL_TABLET | ORAL | Status: DC | PRN
  Administered 2017-12-30 (×3): 25 ug via VAGINAL
  Filled 2017-12-30 (×3): qty 1

## 2017-12-30 MED ORDER — OXYCODONE-ACETAMINOPHEN 5-325 MG PO TABS: 1.0000 | ORAL_TABLET | ORAL | Status: DC | PRN

## 2017-12-30 MED ORDER — LACTATED RINGERS IV SOLN
500.0000 mL | INTRAVENOUS | Status: DC | PRN
  Administered 2018-01-01: 1000 mL via INTRAVENOUS

## 2017-12-30 NOTE — Anesthesia Pain Management Evaluation Note (Signed)
  CRNA Pain Management Visit Note  Patient: Brenda Colon, 37 y.o., female  "Hello I am a member of the anesthesia team at [facility]. We have an anesthesia team available at all times to provide care throughout the hospital, including epidural management and anesthesia for C-section. I don't know your plan for the delivery whether it a natural birth, water birth, IV sedation, nitrous supplementation, doula or epidural, but we want to meet your pain goals."   1.Was your pain managed to your expectations on prior hospitalizations?   No prior hospitalizations  2.What is your expectation for pain management during this hospitalization?     Labor support without medications and Nitrous Oxide  3.How can we help you reach that goal? unsure  Record the patient's initial score and the patient's pain goal.   Pain: 0  Pain Goal: 8 The [facility] wants you to be able to say your pain was always managed very well.  Casimer Lanius 12/30/2017

## 2017-12-30 NOTE — Progress Notes (Signed)
Brenda Colon is a 37 y.o. G3P0020 at [redacted]w[redacted]d by LMP admitted for induction of labor due to high risk pregnancy Hx cHTN, AMA, Large fetal head size and GBS+.  Subjective: Tolerating well with no pain and no nausea, vomiting, headache, RUQ pain or visual disturbances, resting comfortably. No vaginal leakage of fluid or blood has + Fetal movements.      Objective: BP 118/81   Pulse 97   Temp 98.4 F (36.9 C) (Oral)   Resp 18   Ht 5\' 5"  (1.651 m)   Wt 107.7 kg (237 lb 6.4 oz)   LMP 04/03/2017   BMI 39.51 kg/m  No intake/output data recorded. No intake/output data recorded.  FHT:  FHR: 130 bpm, variability: minimal ,  accelerations:  Present,  decelerations:  Absent UC:   irregular, every 4 minutes SVE:   Dilation: Fingertip Effacement (%): Thick Station: Ballotable Exam by:: [employer]: Lab Results  Component Value Date   WBC 8.1 12/30/2017   HGB 10.9 (L) 12/30/2017   HCT 33.5 (L) 12/30/2017   MCV 79.6 12/30/2017   PLT 151 12/30/2017    Assessment / Plan: High risk, AMA, cHTN, Large fetal head  Labor: Progressing normally Preeclampsia:  labs stable Fetal Wellbeing:  Category I Pain Control:  patient stated earlier in the day did not want epidural Anticipated MOD:  NSVD once favorable.  Continue expectant management  Cytotec x3  Evaluate for possible mechanical dilation with foley  Continue to monitor maternal and fetal well being.    Justus Memory 12/30/2017, 7:07 PM

## 2017-12-30 NOTE — Progress Notes (Addendum)
Brenda Colon is a 37 y.o. G3P0020 at [redacted]w[redacted]d by LMP admitted for induction of labor due to high risk pregnancy due to Hx cHTN, AMA and large fetal head size and GBS+ status..  Subjective:  Tolerating well with no pain and no nausea, vomiting, headache, RUQ pain or visual disturbances, resting comfortably. No vaginal leakage of fluid or blood has + Fetal movements.    Objective: BP 115/84   Pulse 94   Temp 98 F (36.7 C) (Oral)   Resp 18   Ht 5\' 5"  (1.651 m)   Wt 107.7 kg (237 lb 6.4 oz)   LMP 04/03/2017   BMI 39.51 kg/m  No intake/output data recorded. No intake/output data recorded.  FHT:  FHR: 140 bpm, variability: moderate,  accelerations:  Present,  decelerations:  Absent and   UC:   regular, every 3 minutes SVE:   Dilation: Fingertip Effacement (%): Thick Station: Ballotable Exam by:: [employer]: Lab Results  Component Value Date   WBC 8.1 12/30/2017   HGB 10.9 (L) 12/30/2017   HCT 33.5 (L) 12/30/2017   MCV 79.6 12/30/2017   PLT 151 12/30/2017    Assessment / Plan:   IOL high risk pregnancy due to Hx cHTN, AMA and large fetal head size and GBS+ status.   Labor: Progressing normally Cytotec x1  Preeclampsia:  None and PIH labs reviewed and not concerning Fetal Wellbeing:  Category I Pain Control:  Patient does not want epidural  I/D:  n/a Anticipated MOD:  NSVD   Patient is for repeat Cervical check and  Cytotec x2 given  Monitor maternal and fetal wellbeing. Management is expectant.  Justus Memory 12/30/2017, 2:56 PM

## 2017-12-30 NOTE — Progress Notes (Signed)
Vitals:   12/30/17 2023 12/30/17 2108  BP: 114/80 116/86  Pulse: 80 93  Resp: 15 18  Temp:     cx FT/50/-2. Foley in os but wouldn't thread.  FHR Cat 1.  Some ctx that pt isn't feeling .Will give cytotec (4th) orally.

## 2017-12-30 NOTE — H&P (Addendum)
OBSTETRIC ADMISSION HISTORY AND PHYSICAL  Brenda Colon is a 37 y.o. female G3P0020 with IUP at [redacted]w[redacted]d presenting for IOL due to high risk pregnancy for cHTN, AMA and large fetal head she is also GBS+. She reports +FMs. No LOF, VB, blurry vision, headaches. Has mild peripheral edema non pitting to the tops of the feet. Denies RUQ pain. She plans on Breast feeding. She is unsure of birth control. Does not wish to know sex of baby and has been technically difficult to obtain on sono due to body habitus; plan for circ if boy.   Dating: By LMP --->  Estimated Date of Delivery: 01/06/18  Sono:    @[redacted]w[redacted]d , CWD, normal anatomy, Cephalic presentation, 1572I, 85%ile, EFW 2526g   Prenatal History/Complications: No complications.  Past Medical History: Past Medical History:  Diagnosis Date  . Hypertension   . STD (sexually transmitted disease)    [place]  . Uterine fibroid     Past Surgical History: Past Surgical History:  Procedure Laterality Date  . CYSTECTOMY     Wrist  . INDUCED ABORTION      Obstetrical History: OB History    Gravida  3   Para      Term      Preterm      AB  2   Living  0     SAB  1   TAB  1   Ectopic      Multiple      Live Births              Social History: Social History   Socioeconomic History  . Marital status: Single    Spouse name: Not on file  . Number of children: Not on file  . Years of education: Not on file  . Highest education level: Not on file  Occupational History  . Not on file  Social Needs  . Financial resource strain: Not on file  . Food insecurity:    Worry: Not on file    Inability: Not on file  . Transportation needs:    Medical: Not on file    Non-medical: Not on file  Tobacco Use  . Smoking status: Former Research scientist (life sciences)  . Smokeless tobacco: Never Used  Substance and Sexual Activity  . Alcohol use: No    Frequency: Never  . Drug use: No  . Sexual activity: Not Currently    Partners: Male    Birth  control/protection: None    Comment: 1st intercourse 31 yo-5 partners  Lifestyle  . Physical activity:    Days per week: Not on file    Minutes per session: Not on file  . Stress: Not on file  Relationships  . Social connections:    Talks on phone: Not on file    Gets together: Not on file    Attends religious service: Not on file    Active member of club or organization: Not on file    Attends meetings of clubs or organizations: Not on file    Relationship status: Not on file  Other Topics Concern  . Not on file  Social History Narrative  . Not on file    Family History: Family History  Problem Relation Age of Onset  . Diabetes Mother   . Diabetes Father   . Lupus Sister     Allergies: No Known Allergies  Medications Prior to Admission  Medication Sig Dispense Refill Last Dose  . aspirin EC 81 MG tablet Take 1 tablet (  81 mg total) by mouth daily. Take after 12 weeks for prevention of preeclampsia later in pregnancy 300 tablet 2 Taking  . cetirizine (ZYRTEC) 10 MG tablet Take 10 mg by mouth daily.   Not Taking  . diphenhydrAMINE HCl (BENADRYL PO) Take by mouth.   Not Taking  . ferrous sulfate 325 (65 FE) MG tablet Take 325 mg by mouth daily with breakfast.   Taking  . fexofenadine (ALLEGRA) 180 MG tablet Take 180 mg by mouth daily.   Not Taking  . fluticasone (FLONASE) 50 MCG/ACT nasal spray Place into both nostrils daily.   Not Taking  . FOLIC ACID PO Take by mouth.   Taking  . NIFEdipine (PROCARDIA-XL/ADALAT-CC/NIFEDICAL-XL) 30 MG 24 hr tablet Take by mouth once per day. 30 tablet 2 Taking  . Prenatal MV-Min-FA-Omega-3 (PRENATAL GUMMIES/DHA & FA) 0.4-32.5 MG CHEW Chew 1 tablet by mouth daily. 30 tablet 12 Taking     Review of Systems   All systems reviewed and negative except as stated in HPI  Blood pressure (!) 128/100, pulse (!) 104, temperature 97.7 F (36.5 C), temperature source Oral, height 5\' 5"  (1.651 m), weight 107.7 kg (237 lb 6.4 oz), last menstrual  period 04/03/2017. General appearance: alert, cooperative and no distress Lungs: regular rate and effort Heart: regular rate  Abdomen: soft, non-tender Extremities: Homans sign is negative, no sign of DVT, non pitting peripheral edema to the tops of the feet only. Presentation: Cephalic  Fetal monitoringBaseline: 130 bpm, Variability: Good {> 6 bpm), Accelerations: Reactive and Decelerations: Absent Uterine activityNone     Prenatal labs: ABO, Rh: O/Positive/-- (02/21 1530) Antibody: Negative (02/21 1530) Rubella: 9.65 (02/21 1530) RPR: Non Reactive (05/31 0916)  HBsAg: Negative (02/21 1530)  HIV: Non Reactive (05/31 0916)  GBS:   Postive 2 hr GTT Negative   Prenatal Transfer Tool  Maternal Diabetes: No Genetic Screening: Normal Quad screen:  hCG and DIA, were very high (3.74 MoM and 3.08 MoM). This has been associated with an increased risk for growth restriction or poor pregnancy outcome later in pregnancy Maternal Ultrasounds/Referrals: Normal Fetal Ultrasounds or other Referrals:  None Maternal Substance Abuse:  No Significant Maternal Medications:  Meds include: Other:  Procardia Significant Maternal Lab Results: Lab values include: Group B Strep positive  No results found for this or any previous visit (from the past 24 hour(s)).  Patient Active Problem List   Diagnosis Date Noted  . Chronic hypertension affecting pregnancy 12/30/2017  . GBS (group B Streptococcus carrier), +RV culture, currently pregnant 12/21/2017  . UTI (urinary tract infection) in pregnancy in third trimester 12/16/2017  . Large fetal head circumference on 6/14 11/21/2017  . Anemia in pregnancy 11/12/2017  . Family history of microcephaly 09/14/2017  . Fibroid 08/15/2017  . Advanced maternal age in multigravida 08/05/2017  . Supervision of high risk pregnancy, antepartum 07/29/2017  . Chronic hypertension during pregnancy, antepartum 03/26/2017  . GERD with esophagitis 03/26/2017     Assessment: Brenda Colon is a 37 y.o. G3P0020 at [redacted]w[redacted]d here for IOL due to cHTN, AMA, Large fetal head and is GBS+   1. Labor: To Start induction 2. FWB: Cat 1 3. Pain: None 4. GBS: Positive  5. cHTN    Plan: For induction of labour after Cervical Check  Cevix was closed and thick. Start cytotec  PCN prophylaxis for GBS+ status  Continue Procardia for cHTN. Obtain PIH labs.  Close monitoring of Maternal and fetal wellbeing and vitals  Analgesia as per patient's comfort. (refused epidural)  Expectant management   Justus Memory, MD  12/30/2017, 7:32 AM   OB FELLOW HISTORY AND PHYSICAL ATTESTATION  I have seen and examined this patient; I agree with above documentation in the resident's note and have edited as appropriate.   Phill Myron, D.O. OB Fellow  12/30/2017, 11:21 AM

## 2017-12-31 MED ORDER — MISOPROSTOL 50MCG HALF TABLET
50.0000 ug | ORAL_TABLET | ORAL | Status: DC | PRN
  Administered 2017-12-31 (×2): 50 ug via BUCCAL
  Filled 2017-12-31 (×3): qty 1

## 2017-12-31 NOTE — Care Management Note (Signed)
Case Management Note  Patient Details  Name: Brenda Colon MRN: [id number] Date of Birth: 09-12-1980  Subjective/Objective:   37 year old female admitted yesterday for IOL d/t cHTN, AMA, and large fetal head.                Action/Plan:D/C when medically stable.              Expected Discharge Plan:  Home/Self Care  In-House Referral:  Clinical Social Work  Discharge planning Services  CM Consult  Status of Service:  Completed, signed off   Comments:CM received referral from [place] regarding consult.  Patient has Medicaid and  is not eligible for medication assistance/MATCH.  Please re consult for any SW needs or CM needs.  Harrie Cazarez RNC-MNN, BSN 12/31/2017, 9:21 AM

## 2017-12-31 NOTE — Progress Notes (Signed)
Labor Progress Note Brenda Colon is a 37 y.o. G3P0020 at [redacted]w[redacted]d presented for [facility] and large fetal head, GBS+ on Pen G.   S:  Patient has no complains denies headache, blurry vision or visual change or RUQ pain.   O:  BP 122/80   Pulse 89   Temp 98.7 F (37.1 C) (Oral)   Resp 16   Ht 5\' 5"  (1.651 m)   Wt 107.7 kg (237 lb 6.4 oz)   LMP 04/03/2017   BMI 39.51 kg/m  EFM: baseline 140s bpm/ >6Bpm variability/ present accels/ no decels  Toco: Irregular and not substantial  SVE: Dilation: 1 Effacement (%): 50 Cervical Position: Posterior Station: -3 Presentation: Vertex Exam by:: Derrill Memo Pitocin: 0 mu/min and has had x7 cytotec in the last 48 hrs   A/P: 37 y.o. G3P0020 [redacted]w[redacted]d  1. Labor: Not in labour  2. FWB: Cat 1 3. Pain: None  Patient is not in labour There has been slow cervical change and difficulty passing foley balloon  As per patient comfort will reassess if foley balloon can be passed later Cytotec continue.   Justus Memory, MD 5:34 PM

## 2017-12-31 NOTE — Progress Notes (Signed)
Patient ID: Brenda Colon, female   DOB: 1981/01/05, 37 y.o.   MRN: [id number]  S/p cytotec x 6 doses total  BP 120/80, P 88 FHR 130-140s, +accels, no decels Ctx irreg Cx post 1/50/vtx -3  IUP@term  Cx unfavorable cHTN- on Procardia  Attempted to place cervical foley- unable Will continue cytotec dosing  Serita Grammes CNM 12/31/2017 1:09 PM

## 2017-12-31 NOTE — Progress Notes (Signed)
Labor Progress Note Sigrid Schwebach is a 37 y.o. G3P0020 at [redacted]w[redacted]d presented for IOL due to high risk pregnancy due to Hx cHTN, AMA, large fetal head size by [place] and GBS+.  S:  She is comfortable and reports no headache, nausea, vomiting, visual changes and no RUQ pain.   O:  BP 120/80   Pulse 88   Temp 98.7 F (37.1 C) (Oral)   Resp 16   Ht 5\' 5"  (1.651 m)   Wt 107.7 kg (237 lb 6.4 oz)   LMP 04/03/2017   BMI 39.51 kg/m  EFM: baseline 130-140 bpm/ moderate variability/ postive accels/ no decels  Toco: 1 in 3 moderate  SVE: Dilation: Fingertip Effacement (%): Thick Cervical Position: Posterior Station: Ballotable Presentation: Vertex Exam by:: Mabeline Caras, RN Pitocin: none mu/min  A/P: 37 y.o. G3P0020 [redacted]w[redacted]d  1. Labor: IOL not in labour  2. FWB: Cat 1 3. Pain: none  Patient is currently being ripened on Cytotec 50 Anticipate NSVD To consider foley balloon later if possible  Fetal and maternal monitoring  Expectant monitoring.  Justus Memory, MD 11:39 AM

## 2017-12-31 NOTE — Progress Notes (Signed)
Vitals:   12/31/17 0539 12/31/17 0638  BP: (!) 138/98 (!) 128/98  Pulse: 90 89  Resp: 15 16  Temp: 98.7 F (37.1 C)    Cx 1/50/-2.  Foely still wouldn't thread past inner os.  FHR cat 1.  Irregular mild ctx.  6th cytotec

## 2017-12-31 NOTE — Progress Notes (Signed)
CSW received consult for medication assistance.  CSW does not assist with this type of issue.  CSW left message for Case Manager/T. Craft to follow up.  CSW is screening out referral.

## 2018-01-01 ENCOUNTER — Other Ambulatory Visit: Payer: Self-pay

## 2018-01-01 ENCOUNTER — Inpatient Hospital Stay (HOSPITAL_COMMUNITY): Payer: Medicaid Other | Admitting: Anesthesiology

## 2018-01-01 ENCOUNTER — Encounter (HOSPITAL_COMMUNITY): Payer: Self-pay

## 2018-01-01 DIAGNOSIS — Z3A39 39 weeks gestation of pregnancy: Secondary | ICD-10-CM

## 2018-01-01 DIAGNOSIS — O1002 Pre-existing essential hypertension complicating childbirth: Secondary | ICD-10-CM

## 2018-01-01 DIAGNOSIS — O99824 Streptococcus B carrier state complicating childbirth: Secondary | ICD-10-CM

## 2018-01-01 LAB — CBC
HCT: 36.5 % (ref 36.0–46.0)
HCT: 35.7 % — ABNORMAL LOW (ref 36.0–46.0)
HEMOGLOBIN: 11.8 g/dL — AB (ref 12.0–15.0)
Hemoglobin: 11.6 g/dL — ABNORMAL LOW (ref 12.0–15.0)
MCH: 25.6 pg — AB (ref 26.0–34.0)
MCH: 25.7 pg — ABNORMAL LOW (ref 26.0–34.0)
MCHC: 32.3 g/dL (ref 30.0–36.0)
MCHC: 32.5 g/dL (ref 30.0–36.0)
MCV: 79 fL (ref 78.0–100.0)
MCV: 79.2 fL (ref 78.0–100.0)
Platelets: 161 10*3/uL (ref 150–400)
Platelets: 162 10*3/uL (ref 150–400)
RBC: 4.61 MIL/uL (ref 3.87–5.11)
RBC: 4.52 MIL/uL (ref 3.87–5.11)
RDW: 19.6 % — ABNORMAL HIGH (ref 11.5–15.5)
RDW: 19.4 % — ABNORMAL HIGH (ref 11.5–15.5)
WBC: 11.8 10*3/uL — ABNORMAL HIGH (ref 4.0–10.5)
WBC: 15 10*3/uL — AB (ref 4.0–10.5)

## 2018-01-01 MED ORDER — FENTANYL 2.5 MCG/ML BUPIVACAINE 1/10 % EPIDURAL INFUSION (WH - ANES)
14.0000 mL/h | INTRAMUSCULAR | Status: DC | PRN
  Administered 2018-01-01: 14 mL/h via EPIDURAL
  Filled 2018-01-01: qty 100

## 2018-01-01 MED ORDER — PRENATAL MULTIVITAMIN CH
1.0000 | ORAL_TABLET | Freq: Every day | ORAL | Status: DC
  Administered 2018-01-01 – 2018-01-03 (×3): 1 via ORAL
  Filled 2018-01-01 (×3): qty 1

## 2018-01-01 MED ORDER — ACETAMINOPHEN 325 MG PO TABS
650.0000 mg | ORAL_TABLET | ORAL | Status: DC | PRN
  Administered 2018-01-01: 650 mg via ORAL
  Filled 2018-01-01: qty 2

## 2018-01-01 MED ORDER — LIDOCAINE HCL (PF) 1 % IJ SOLN
INTRAMUSCULAR | Status: DC | PRN
  Administered 2018-01-01 (×2): 6 mL via EPIDURAL

## 2018-01-01 MED ORDER — PHENYLEPHRINE 40 MCG/ML (10ML) SYRINGE FOR IV PUSH (FOR BLOOD PRESSURE SUPPORT)
80.0000 ug | PREFILLED_SYRINGE | INTRAVENOUS | Status: DC | PRN
  Filled 2018-01-01: qty 5

## 2018-01-01 MED ORDER — SIMETHICONE 80 MG PO CHEW: 80.0000 mg | CHEWABLE_TABLET | ORAL | Status: DC | PRN

## 2018-01-01 MED ORDER — DIPHENHYDRAMINE HCL 50 MG/ML IJ SOLN: 12.5000 mg | INTRAMUSCULAR | Status: DC | PRN

## 2018-01-01 MED ORDER — DIBUCAINE 1 % RE OINT: 1.0000 "application " | TOPICAL_OINTMENT | RECTAL | Status: DC | PRN

## 2018-01-01 MED ORDER — LACTATED RINGERS IV SOLN
500.0000 mL | Freq: Once | INTRAVENOUS | Status: AC
  Administered 2018-01-01: 500 mL via INTRAVENOUS

## 2018-01-01 MED ORDER — ZOLPIDEM TARTRATE 5 MG PO TABS: 5.0000 mg | ORAL_TABLET | Freq: Every evening | ORAL | Status: DC | PRN

## 2018-01-01 MED ORDER — BENZOCAINE-MENTHOL 20-0.5 % EX AERO
1.0000 "application " | INHALATION_SPRAY | CUTANEOUS | Status: DC | PRN
  Administered 2018-01-01: 1 via TOPICAL
  Filled 2018-01-01: qty 56

## 2018-01-01 MED ORDER — TETANUS-DIPHTH-ACELL PERTUSSIS 5-2.5-18.5 LF-MCG/0.5 IM SUSP
0.5000 mL | Freq: Once | INTRAMUSCULAR | Status: AC
  Administered 2018-01-03: 0.5 mL via INTRAMUSCULAR
  Filled 2018-01-01: qty 0.5

## 2018-01-01 MED ORDER — COCONUT OIL OIL: 1.0000 "application " | TOPICAL_OIL | Status: DC | PRN

## 2018-01-01 MED ORDER — SENNOSIDES-DOCUSATE SODIUM 8.6-50 MG PO TABS
2.0000 | ORAL_TABLET | ORAL | Status: DC
  Administered 2018-01-02: 2 via ORAL
  Filled 2018-01-01 (×2): qty 2

## 2018-01-01 MED ORDER — IBUPROFEN 600 MG PO TABS
600.0000 mg | ORAL_TABLET | Freq: Four times a day (QID) | ORAL | Status: DC
  Administered 2018-01-01 – 2018-01-03 (×9): 600 mg via ORAL
  Filled 2018-01-01 (×9): qty 1

## 2018-01-01 MED ORDER — NIFEDIPINE ER OSMOTIC RELEASE 30 MG PO TB24
30.0000 mg | ORAL_TABLET | Freq: Every day | ORAL | Status: DC
  Administered 2018-01-01 – 2018-01-03 (×3): 30 mg via ORAL
  Filled 2018-01-01 (×3): qty 1

## 2018-01-01 MED ORDER — ONDANSETRON HCL 4 MG PO TABS: 4.0000 mg | ORAL_TABLET | ORAL | Status: DC | PRN

## 2018-01-01 MED ORDER — WITCH HAZEL-GLYCERIN EX PADS: 1.0000 "application " | MEDICATED_PAD | CUTANEOUS | Status: DC | PRN

## 2018-01-01 MED ORDER — PHENYLEPHRINE 40 MCG/ML (10ML) SYRINGE FOR IV PUSH (FOR BLOOD PRESSURE SUPPORT)
80.0000 ug | PREFILLED_SYRINGE | INTRAVENOUS | Status: DC | PRN
  Filled 2018-01-01: qty 10
  Filled 2018-01-01: qty 5

## 2018-01-01 MED ORDER — EPHEDRINE 5 MG/ML INJ
10.0000 mg | INTRAVENOUS | Status: DC | PRN
  Filled 2018-01-01: qty 2

## 2018-01-01 MED ORDER — ONDANSETRON HCL 4 MG/2ML IJ SOLN: 4.0000 mg | INTRAMUSCULAR | Status: DC | PRN

## 2018-01-01 MED ORDER — BUPIVACAINE HCL (PF) 0.25 % IJ SOLN
INTRAMUSCULAR | Status: DC | PRN
  Administered 2018-01-01 (×2): 5 mL via EPIDURAL

## 2018-01-01 MED ORDER — DIPHENHYDRAMINE HCL 25 MG PO CAPS: 25.0000 mg | ORAL_CAPSULE | Freq: Four times a day (QID) | ORAL | Status: DC | PRN

## 2018-01-01 NOTE — Progress Notes (Addendum)
Patient ID: Warren Kugelman, female   DOB: April 29, 1981, 37 y.o.   MRN: [id number]  Called to pt's room at 0130 to assist with IFSE placement to help differentiate FHR from maternal HR. They appeared to both be running just under 120s. However, IFSE placement x 2 showed FHR in the high 80s; Dr Glo Herring called to come help eval using U/S. Terb also given. Cx was 5/C/vtx -2 at this point. He placed IFSE x 2 more times before getting a reading of FHR 120-130s from the scalp electrode. There did seem to be a brady as FHR on U/S was showing a rate of around 100. Within the 22mins- hour just prior to this episode, the cervical foley came out, she had SROM, and ctx really kicked in so it appears that the FHR was responding to these things happening in quick succession and it is now stable. Anesthesia will be called for epidural placement.  Serita Grammes 01/01/2018

## 2018-01-01 NOTE — Anesthesia Procedure Notes (Signed)
Epidural Patient location during procedure: OB Start time: 01/01/2018 2:10 AM End time: 01/01/2018 2:13 AM  Staffing Anesthesiologist: Lyn Hollingshead, MD Performed: anesthesiologist   Preanesthetic Checklist Completed: patient identified, site marked, surgical consent, pre-op evaluation, timeout performed, IV checked, risks and benefits discussed and monitors and equipment checked  Epidural Patient position: sitting Prep: site prepped and draped and DuraPrep Patient monitoring: continuous pulse ox and blood pressure Approach: midline Location: L3-L4 Injection technique: LOR air  Needle:  Needle type: Tuohy  Needle gauge: 17 G Needle length: 9 cm and 9 Needle insertion depth: 6 cm Catheter type: closed end flexible Catheter size: 19 Gauge Catheter at skin depth: 11 cm Test dose: negative and Other  Assessment Sensory level: T9 Events: blood not aspirated, injection not painful, no injection resistance, negative IV test and no paresthesia  Additional Notes Reason for block:procedure for pain

## 2018-01-01 NOTE — Anesthesia Postprocedure Evaluation (Signed)
Anesthesia Post Note  Patient: Brenda Colon  Procedure(s) Performed: AN AD [facility]     Patient location during evaluation: Mother Baby Anesthesia Type: Epidural Level of consciousness: awake, awake and alert and oriented Pain management: pain level controlled Vital Signs Assessment: post-procedure vital signs reviewed and stable Respiratory status: spontaneous breathing, nonlabored ventilation and respiratory function stable Cardiovascular status: stable Postop Assessment: no headache, no backache, patient able to bend at knees, no apparent nausea or vomiting, adequate PO intake and able to ambulate Anesthetic complications: no    Last Vitals:  Vitals:   01/01/18 1415 01/01/18 2020  BP: (!) 141/105 (!) 145/109  Pulse: 90 (!) 109  Resp:    Temp: 36.7 C 36.8 C  SpO2: 100% 98%    Last Pain:  Vitals:   01/01/18 2020  TempSrc: Oral  PainSc:    Pain Goal: Patients Stated Pain Goal: 3 (12/30/17 1919)               Caralina Nop

## 2018-01-01 NOTE — Lactation Note (Signed)
This note was copied from a baby's chart. Lactation Consultation Note  Patient Name: Brenda Colon OACZY'S Date: 01/01/2018 Reason for consult: Initial assessment;1st time breastfeeding;Primapara;Term  P1 mother whose infant is now 18 hours old.  RN in room and checking baby as I arrived.  Since baby is awake I offered to assist with latch and mother accepted.    Mother's breasts are soft and nontender.  Her nipples are everted but short shafted bilaterally.  No intervention for nipples provided at this time; will see how baby latches.  Assisted to latch in the football position on the left breast with no success.  Baby was too sleepy and did not want to open his mouth.  Demonstrated techniques to awaken a sleepy baby including changing his diaper but he still was not interested.  Reassured mother this was normal behavior at this time for a newborn.  Placed baby on mother's chest STS.  Encouraged to feed 8-12 times/24 hours or more if baby shows feeding cues.  Reviewed feeding cues.  Continue STS and hand expression before and after feedings.  Mother will feed any EBM she obtains back to baby.  She will call for latch assistance as needed.  Mom made aware of O/P services, breastfeeding support groups, community resources, and our phone # for post-discharge questions.   Mother's young step son in room with her.  Father not here at this time.   Maternal Data Formula Feeding for Exclusion: No Has patient been taught Hand Expression?: Yes Does the patient have breastfeeding experience prior to this delivery?: No  Feeding Feeding Type: Breast Fed Length of feed: 0 min  LATCH Score Latch: Too sleepy or reluctant, no latch achieved, no sucking elicited.  Audible Swallowing: None  Type of Nipple: Everted at rest and after stimulation(short shafted bilaterally)  Comfort (Breast/Nipple): Soft / non-tender  Hold (Positioning): Assistance needed to correctly position infant at breast and  maintain latch.  LATCH Score: 5  Interventions Interventions: Breast feeding basics reviewed;Assisted with latch;Skin to skin;Breast massage;Hand express;Position options;Support pillows;Adjust position;Breast compression  Lactation Tools Discussed/Used Tools: Pump;Nipple [employer] [facility] Program: Yes   Consult Status Consult Status: Follow-up Date: 01/02/18 Follow-up type: In-patient    Rishik Tubby R Caretha Rumbaugh 01/01/2018, 4:29 PM

## 2018-01-01 NOTE — Progress Notes (Signed)
Patient ID: Brenda Colon, female   DOB: 04-01-1981, 37 y.o.   MRN: [id number]  Complete since 0537; pushing x 10 mins  BP 113/80, P 108 FHR 120s, + variability, early decels w/ ctx to 80s Ctx q 2-3 mins, spont Cx C/C/vtx +2  IUP@term  cHTN End 1st stage  Continue pushing w/ ctx Anticipate SVD Watch decels- may need to offer vacuum if decels increase  Serita Grammes CNM 01/01/2018 6:13 AM

## 2018-01-01 NOTE — Anesthesia Preprocedure Evaluation (Signed)
Anesthesia Evaluation  Patient identified by MRN, date of birth, ID band Patient awake    Reviewed: Allergy & Precautions, H&P , NPO status , Patient's Chart, lab work & pertinent test results  Airway Mallampati: II  TM Distance: >3 FB Neck ROM: full    Dental no notable dental hx. (+) Teeth Intact   Pulmonary neg pulmonary ROS, former smoker,    Pulmonary exam normal breath sounds clear to auscultation       Cardiovascular hypertension, negative cardio ROS   Rhythm:regular Rate:Normal     Neuro/Psych negative neurological ROS  negative psych ROS   GI/Hepatic negative GI ROS, Neg liver ROS,   Endo/Other  negative endocrine ROS  Renal/GU negative Renal ROS     Musculoskeletal   Abdominal (+) + obese,   Peds  Hematology negative hematology ROS (+)   Anesthesia Other Findings   Reproductive/Obstetrics (+) Pregnancy                             Anesthesia Physical Anesthesia Plan  ASA: II  Anesthesia Plan: Epidural   Post-op Pain Management:    Induction:   PONV Risk Score and Plan:   Airway Management Planned:   Additional Equipment:   Intra-op Plan:   Post-operative Plan:   Informed Consent: I have reviewed the patients History and Physical, chart, labs and discussed the procedure including the risks, benefits and alternatives for the proposed anesthesia with the patient or authorized representative who has indicated his/her understanding and acceptance.     Plan Discussed with:   Anesthesia Plan Comments:         Anesthesia Quick Evaluation

## 2018-01-02 LAB — CBC
HEMATOCRIT: 33.1 % — AB (ref 36.0–46.0)
HEMOGLOBIN: 10.7 g/dL — AB (ref 12.0–15.0)
MCH: 25.8 pg — AB (ref 26.0–34.0)
MCHC: 32.3 g/dL (ref 30.0–36.0)
MCV: 80 fL (ref 78.0–100.0)
Platelets: 169 10*3/uL (ref 150–400)
RBC: 4.14 MIL/uL (ref 3.87–5.11)
RDW: 19.7 % — ABNORMAL HIGH (ref 11.5–15.5)
WBC: 15.3 10*3/uL — ABNORMAL HIGH (ref 4.0–10.5)

## 2018-01-02 NOTE — Progress Notes (Signed)
POSTPARTUM PROGRESS NOTE  Post Partum Day 1 Subjective:  Brenda Colon,  a 37 y.o. Q7Y1950 [redacted]w[redacted]d w. A pmh s/o cHTN, is s/p svd w. use of vacuum extractor ppd1.  No acute events overnight.  Pt denies problems with ambulating, voiding or po intake.  She denies nausea or vomiting.  Pain is well controlled. Lochia Minimal.   Objective: Patient Vitals for the past 24 hrs:  BP Temp Temp src Pulse Resp SpO2  01/02/18 0557 (!) 133/98 98.1 F (36.7 C) Oral (!) 102 - -  01/01/18 2020 (!) 145/109 98.2 F (36.8 C) Oral (!) 109 - 98 %  01/01/18 1415 (!) 141/105 98 F (36.7 C) Oral 90 - 100 %  01/01/18 1015 (!) 140/108 98 F (36.7 C) - 80 18 -  01/01/18 0915 (!) 144/103 97.9 F (36.6 C) Oral 79 15 100 %  01/01/18 0840 (!) 139/97 - - 84 15 -  01/01/18 0817 (!) 134/97 - - 86 15 -  01/01/18 0801 (!) 131/101 - - 86 15 -  01/01/18 0748 (!) 124/92 - - 95 14 -    Physical Exam:  General: alert, cooperative and no distress Lochia:normal flow Chest: no respiratory distress Heart:regular rate no M/R/G auscultated Abdomen: soft, nontender,  Uterine Fundus: firm DVT Evaluation: +1 pitting edema bilaterally.  Calf and leg are not tender to palpation    Recent Labs    01/01/18 0831 01/02/18 0543  HGB 11.6* 10.7*  HCT 35.7* 33.1*    Assessment/Plan:  ASSESSMENT: Brenda Colon,  a 37 y.o. D3O6712 [redacted]w[redacted]d w. A pmh s/o cHTN, is s/p svd w. use of vacuum extractor ppd1, and is recovering well  Plan 1. Continue Procardia 30mg  daily for cHTN and prenatal vitamins for breastfeeding. 2. Plan for discharge tomorrow and pt has reported contraception will be determined at 6w postpartum visit 3. Pt reports she will circumcise her son in outpatient setting 4. F/u on Monday with [facility] for blood pressure check 5. F/u @ 6w post partum visit   LOS: 3 days   Brenda Colon I Brenda Colon MS3 01/02/2018, 7:44 AM

## 2018-01-02 NOTE — Progress Notes (Signed)
POSTPARTUM PROGRESS NOTE  Post Partum Day 1  Subjective:  Brenda Colon is a 37 y.o. Q9I5038 s/p VAVD at [redacted]w[redacted]d.  No acute events overnight.  Pt denies problems with ambulating, voiding or po intake.  She denies nausea or vomiting.  Pain is well controlled.  She has had flatus. She has not had bowel movement.  Lochia Moderate.   Objective: Blood pressure (!) 133/98, pulse (!) 102, temperature 98.1 F (36.7 C), temperature source Oral, resp. rate 18, height 5\' 5"  (1.651 m), weight 237 lb 6.4 oz (107.7 kg), last menstrual period 04/03/2017, SpO2 98 %, unknown if currently breastfeeding.  Physical Exam:  General: alert, cooperative and no distress Abdomen: soft, nontender,  Uterine Fundus: firm, appropriately tender DVT Evaluation: No calf swelling or tenderness Extremities: No edema  Recent Labs    01/01/18 0831 01/02/18 0543  HGB 11.6* 10.7*  HCT 35.7* 33.1*    Assessment/Plan: Brenda Colon is a 37 y.o. U8K8003 s/p VAVD at [redacted]w[redacted]d   PPD#1 - Doing well Contraception: Undecided  Feeding: Breast  Dispo: Plan for discharge tomorrow.   LOS: 3 days   Herby Abraham, CNM 01/02/2018, 8:33 AM

## 2018-01-02 NOTE — Lactation Note (Signed)
This note was copied from a baby's chart. Lactation Consultation Note  Patient Name: Brenda Colon [id number] Date: 01/02/2018 Reason for consult: Follow-up assessment;Primapara;1st time breastfeeding;Term;Infant weight loss;Other (Comment)(right breast to latch - #20 NS , left without )   Baby is 18 hours old  Per mom baby has been feeding on the right with #20 NS and on the left can latch without the NS .  LC reviewed and updated the doc flow sheets  Mom changed a wet diaper prior to latching.  Mom able to apply the #20 NS properly after review.  Baby latched easily on the right breast / football / flanged lips/ swallows noted, and still  Feeding at 28 mins/ mom comfortable. LC showed mom breast compressions technique  And increased swallows noted.  LC instructed mom on the use shells between feedings except when sleeping to enhance the  Compressibility of areolas. Definitely needed on the right breast . Per mom has a bra.  Mom already has  A hand pump and a LC at this consult set up a DEBP.  Mom voiced understanding. LC provided a curved tip syringe for instilling EBM into the top of the NS prior to feeding for an appetizer, if greater volume let mom know it can be finger fed back to baby  Or and SNS can be set up by the RN or [place].  Per mom active with [facility] / [place] .  LC mentioned to mom if she is still having to use a NS for latching . DEBP post pumping is indicated  And recommended to protect milk supply .     Maternal Data Has patient been taught Hand Expression?: Yes(per mom comfortable )  Feeding Feeding Type: Breast Fed Length of feed: (still feeding at 27 mins , multiple swallows / mom comfortable )  LATCH Score Latch: Grasps breast easily, tongue down, lips flanged, rhythmical sucking.  Audible Swallowing: Spontaneous and intermittent  Type of Nipple: Everted at rest and after stimulation  Comfort (Breast/Nipple): Soft / non-tender  Hold (Positioning): Assistance needed  to correctly position infant at breast and maintain latch.  LATCH Score: 9  Interventions Interventions: Breast feeding basics reviewed;Assisted with latch;Skin to skin;Breast massage;Hand express;Breast compression;Adjust position;Support pillows;Position options;Shells;DEBP;Hand pump  Lactation Tools Discussed/Used Tools: Shells;Pump;Flanges;Nipple Shields Nipple shield size: 20(NS started before this consult - #20 / mom able to apply correctly ) Flange Size: 24 Shell Type: Inverted Breast pump type: Double-Electric Breast Pump;Manual WIC Program: Yes Pump Review: Setup, frequency, and cleaning;Milk Storage Initiated by:: MAI  Date initiated:: 01/02/18   Consult Status Consult Status: Follow-up Date: 01/03/18 Follow-up type: In-patient    [place] 01/02/2018, 4:28 PM

## 2018-01-03 MED ORDER — OXYCODONE-ACETAMINOPHEN 5-325 MG PO TABS: 1.0000 | ORAL_TABLET | ORAL | 0 refills | Status: AC | PRN

## 2018-01-03 MED ORDER — IBUPROFEN 600 MG PO TABS: 600.0000 mg | ORAL_TABLET | Freq: Four times a day (QID) | ORAL | 0 refills | Status: DC

## 2018-01-03 NOTE — Lactation Note (Signed)
This note was copied from a baby's chart. Lactation Consultation Note  Patient Name: Brenda Colon Date: 01/03/2018 Reason for consult: Follow-up assessment;Infant weight loss;Primapara;1st time breastfeeding;Term(6% weight loss/ )  Baby is 9 hours old LC reviewed doc flow sheet . Per mom has not used the Nipple shield to latch since yesterday. Mom denies soreness .  Sore nipple and engorgement prevention and tx reviewed . LC recommended to mom to wear her breast shells between feedings except when sleeping especially when her milk comes in . Mom already has hand pump and the DEBP [employer]  Kit. She is active with [facility] / [place] if needing a DEBP . Mom is aware of the resource.  Mother informed of post-discharge support and given phone number to the lactation department, including services for phone call assistance; out-patient appointments; and breastfeeding support group. List of other breastfeeding resources in the community given in the handout. Encouraged mother to call for problems or concerns related to breastfeeding.   Maternal Data    Feeding Feeding Type: (baby recently breast fed and alseep ) Length of feed: 60 min  LATCH Score ( Latch score by the [facility] )  Latch: Grasps breast easily, tongue down, lips flanged, rhythmical sucking.  Audible Swallowing: Spontaneous and intermittent  Type of Nipple: Everted at rest and after stimulation  Comfort (Breast/Nipple): Soft / non-tender  Hold (Positioning): No assistance needed to correctly position infant at breast.  LATCH Score: 10  Interventions Interventions: Breast feeding basics reviewed  Lactation Tools Discussed/Used Nipple shield size: (per mom is latching on the right breast without the NS )   Consult Status Consult Status: Complete Date: 01/03/18    [place] 01/03/2018, 11:10 AM

## 2018-01-03 NOTE — Progress Notes (Signed)
D/c instructions dicussed to include when to call the doctor, prescription meds, & preE s/s.  Pt aware to use mom baby care booklet & PreE flyer for reference if needed.  Pt to be d/c home in stable condition when husband arrives to hospital.  Pt instructed to notify RN when ready for d/c.

## 2018-01-03 NOTE — Discharge Summary (Addendum)
OB Discharge Summary     Patient Name: Brenda Colon DOB: 1981-03-22 MRN: [id number]  Date of admission: 12/30/2017 Delivering MD: Charlton Haws   Date of discharge: 01/03/2018  Admitting diagnosis: INDUCTION Intrauterine pregnancy: [redacted]w[redacted]d     Secondary diagnosis:  Active Problems:   Chronic hypertension during pregnancy, antepartum   Supervision of high risk pregnancy, antepartum   Advanced maternal age in multigravida   Anemia in pregnancy   Large fetal head circumference on 6/14   GBS (group B Streptococcus carrier), +RV culture, currently pregnant   Chronic hypertension affecting pregnancy 2nd Degree perineum tear with repair.  Additional problems: none     Discharge diagnosis: Term Pregnancy Delivered                                                                                                Post partum procedures:Perinium repair  2nd degree  Augmentation: Cytotec and Foley Balloon  Complications: None  Hospital course:  Induction of Labor With Vaginal Delivery   37 y.o. yo G3O7564 at [redacted]w[redacted]d was admitted to the hospital 12/30/2017 for induction of labor.  Indication for induction: Chronic HTN.  Patient had an uncomplicated labor course as follows: Membrane Rupture Time/Date: 12:43 AM ,01/01/2018   Intrapartum Procedures: Episiotomy: None [1]                                         Lacerations:  2nd degree [3];Perineal [11]  Patient had delivery of a Viable infant.  Information for the patient's newborn:  Dareth, Andrew [[id number]]  Delivery Method: Vaginal, Vacuum (Extractor)(Filed from Delivery Summary)   01/01/2018  Details of delivery can be found in separate delivery note.  Patient had a routine postpartum course. Patient is discharged home 01/03/18.  Physical exam  Vitals:   01/02/18 1433 01/02/18 2336 01/03/18 0552 01/03/18 0636  BP: (!) 133/96 (!) 137/95 (!) 136/104 (!) 144/105  Pulse: (!) 58 90 87 84  Resp: 18 19 18    Temp: 98.1 F (36.7 C) 98.5 F  (36.9 C) 98.1 F (36.7 C)   TempSrc: Oral Oral Oral   SpO2: 100% 100% 100%   Weight:      Height:       General: alert and cooperative Lochia: appropriate Uterine Fundus: firm Incision:NA DVT Evaluation: No evidence of DVT seen on physical exam. No cords or calf tenderness. Labs: Lab Results  Component Value Date   WBC 15.3 (H) 01/02/2018   HGB 10.7 (L) 01/02/2018   HCT 33.1 (L) 01/02/2018   MCV 80.0 01/02/2018   PLT 169 01/02/2018   CMP Latest Ref Rng & Units 12/30/2017  Glucose 70 - 99 mg/dL 105(H)  BUN 6 - 20 mg/dL 5(L)  Creatinine 0.44 - 1.00 mg/dL 0.63  Sodium 135 - 145 mmol/L 135  Potassium 3.5 - 5.1 mmol/L 4.2  Chloride 98 - 111 mmol/L 105  CO2 22 - 32 mmol/L 21(L)  Calcium 8.9 - 10.3 mg/dL 8.8(L)  Total Protein 6.5 - 8.1 g/dL 5.5(L)  Total Bilirubin 0.3 - 1.2 mg/dL 0.4  Alkaline Phos 38 - 126 U/L 103  AST 15 - 41 U/L 18  ALT 0 - 44 U/L 10    Discharge instruction: per After Visit Summary and "Baby and Me Booklet".  After visit meds:  Allergies as of 01/03/2018   No Known Allergies     Medication List    STOP taking these medications   aspirin EC 81 MG tablet     TAKE these medications   ferrous sulfate 325 (65 FE) MG tablet Take 325 mg by mouth daily with breakfast.   FOLIC ACID PO Take by mouth.   ibuprofen 600 MG tablet Commonly known as:  ADVIL,MOTRIN Take 1 tablet (600 mg total) by mouth every 6 (six) hours.   NIFEdipine 30 MG 24 hr tablet Commonly known as:  PROCARDIA-XL/ADALAT-CC/NIFEDICAL-XL Take by mouth once per day.   PRENATAL GUMMIES/DHA & FA 0.4-32.5 MG Chew Chew 1 tablet by mouth daily.   ranitidine 150 MG tablet Commonly known as:  ZANTAC Take 150 mg by mouth 2 (two) times daily.       Diet: routine diet  Activity: Advance as tolerated. Pelvic rest for 6 weeks.   Outpatient follow up:1 week Follow up Appt:No future appointments. Follow up Visit:No follow-ups on file.  Postpartum contraception:  Undecided  Newborn Data: Live born female  Birth Weight: 7 lb 1.8 oz (3225 g) APGAR: 47, 9  Newborn Delivery   Birth date/time:  01/01/2018 07:16:00 Delivery type:  Vaginal, Vacuum (Extractor)     Baby Feeding: Breast Disposition:home with mother   01/03/2018 Justus Memory, MD  I confirm that I have verified the information documented in the resident's note and that I have also personally reperformed the physical exam and all medical decision making activities. Patient was seen and examined by me also Agree with note Vitals stable, but BPs elevated. Will re-check 1 week Labs stable Fundus firm, lochia within normal limits Perineum healing Ext WNL Continue care  Ready for discharge, Clinic visit in 1 week for BP check  Seabron Spates, CNM

## 2018-01-03 NOTE — Discharge Instructions (Signed)
Care of a Perineal Tear °A perineal tear is a cut (laceration) in the tissue between the opening of the vagina and the anus (perineum). Some women naturally develop a perineal tear during a vaginal birth. This can happen as the baby emerges from the birth canal and the perineum is stretched. Perineal tears are graded based on how deep and long the laceration is. The grading for perineal tears is as follows: °· First degree. This involves a shallow tear at the edge of the vaginal opening that extends slightly into the perineal skin. °· Second degree. This involves tearing described in a first degree perineal tear and also a deeper tear of the vaginal opening and perineal tissues. It may also include tearing of a muscle just under the perineal skin. °· Third degree. This involves tearing described in a first and second degree perineal tear, with the tear extending into the muscle of the anus (anal sphincter). °· Fourth degree. This involves all levels of tear described for first, second, and third degree perineal tear, with the tear extending into the rectum. ° °First degree perineal tears may or may not be stitched closed, depending on their location and appearance. Second, third, and fourth degree perineal tears are stitched closed immediately after the baby’s birth. °What are the risks? °Depending on the type of perineal tear you have, you may be at risk for the following: °· Bleeding. °· Developing a collection of blood in the perineal tear area (hematoma). °· Pain. This may include pain with urination or bowel movements. °· Infection at the site of the tear. °· Fever. °· Trouble controlling your bowels (fecal incontinence). °· Painful sexual intercourse. ° °How to care for a perineal tear °· The first day, put ice on the area of the tear. °? Put ice in a plastic bag. °? Place a towel between your skin and the bag. °? Leave the ice on for 20 minutes, 2-3 times a day. °· Bathe using a warm sitz bath as directed by  your health care provider. This can speed up healing. Sitz baths can be performed in your bathtub or using a sitz bath kit that fits over your toilet. °? Place 3-4 in. (7.6-10 cm) of warm water in your bathtub or fill the sitz bath over-the-toilet container with warm water. Make sure the water is not too hot by placing a drop on your wrist. °? Sit in the warm water for 20-30 minutes. °? After bathing, pat your perineum dry with a clean towel. Do not scrub the perineum as this could cause pain, irritation, or open any stitches you may have. °? Keep the over-the-toilet sitz bath container clean by rinsing it thoroughly after each use. Ask for help in keeping the bathtub clean with diluted bleach and water (2 Tbsp [30 mL] of bleach to ½ gal [1.9 L] of water). °? Repeat the sitz bath as often as you would like to relieve perineal pain, itching, or discomfort. °· Apply a numbing spray to the perineal tear site as directed by your health care provider. This may help with discomfort. °· Wash your hands before and after applying medicine to the area. °· Put about 3 witch hazel-containing hemorrhoid treatment pads on top of your sanitary pad. The witch hazel in the hemorrhoid pads helps with discomfort and swelling. °· Get a squeeze bottle to squeeze warm water on your perineum when urinating, spraying the area from front to back. Pat the area to dry it. °· Sitting on an inflatable   ring or pillow may provide comfort.  Take medicines only as directed by your health care provider.  Do not have sexual intercourse or use tampons until your health care provider says it is okay. Typically, you must wait at least 6 weeks.  Keep all postpartum appointments as directed by your health care provider. Contact a health care provider if:  Your pain is not relieved with medicines.  You have painful urination.  You have a fever. Get help right away if:  You have redness, swelling, or increasing pain in the area of the  tear.  You have pus coming from the area of the tear.  You notice a bad smell coming from the area of the tear.  Your tear opens.  You notice swelling in the area of the tear that is larger than when you left the hospital.  You cannot urinate. This information is not intended to replace advice given to you by your health care provider. Make sure you discuss any questions you have with your health care provider. Document Released: 10/09/2013 Document Revised: 11/06/2015 Document Reviewed: 02/28/2013 Elsevier Interactive Patient Education  2017 [place]. Vaginal Delivery, Care After Refer to this sheet in the next few weeks. These instructions provide you with information about caring for yourself after vaginal delivery. Your health care provider may also give you more specific instructions. Your treatment has been planned according to current medical practices, but problems sometimes occur. Call your health care provider if you have any problems or questions. What can I expect after the procedure? After vaginal delivery, it is common to have:  Some bleeding from your vagina.  Soreness in your abdomen, your vagina, and the area of skin between your vaginal opening and your anus (perineum).  Pelvic cramps.  Fatigue.  Follow these instructions at home: Medicines  Take over-the-counter and prescription medicines only as told by your health care provider.  If you were prescribed an antibiotic medicine, take it as told by your health care provider. Do not stop taking the antibiotic until it is finished. Driving   Do not drive or operate heavy machinery while taking prescription pain medicine.  Do not drive for 24 hours if you received a sedative. Lifestyle  Do not drink alcohol. This is especially important if you are breastfeeding or taking medicine to relieve pain.  Do not use tobacco products, including cigarettes, chewing tobacco, or e-cigarettes. If you need help  quitting, ask your health care provider. Eating and drinking  Drink at least 8 eight-ounce glasses of water every day unless you are told not to by your health care provider. If you choose to breastfeed your baby, you may need to drink more water than this.  Eat high-fiber foods every day. These foods may help prevent or relieve constipation. High-fiber foods include: ? Whole grain cereals and breads. ? Brown rice. ? Beans. ? Fresh fruits and vegetables. Activity  Return to your normal activities as told by your health care provider. Ask your health care provider what activities are safe for you.  Rest as much as possible. Try to rest or take a nap when your baby is sleeping.  Do not lift anything that is heavier than your baby or 10 lb (4.5 kg) until your health care provider says that it is safe.  Talk with your health care provider about when you can engage in sexual activity. This may depend on your: ? Risk of infection. ? Rate of healing. ? Comfort and desire to engage  in sexual activity. Vaginal Care  If you have an episiotomy or a vaginal tear, check the area every day for signs of infection. Check for: ? More redness, swelling, or pain. ? More fluid or blood. ? Warmth. ? Pus or a bad smell.  Do not use tampons or douches until your health care provider says this is safe.  Watch for any blood clots that may pass from your vagina. These may look like clumps of dark red, brown, or black discharge. General instructions  Keep your perineum clean and dry as told by your health care provider.  Wear loose, comfortable clothing.  Wipe from front to back when you use the toilet.  Ask your health care provider if you can shower or take a bath. If you had an episiotomy or a perineal tear during labor and delivery, your health care provider may tell you not to take baths for a certain length of time.  Wear a bra that supports your breasts and fits you well.  If possible, have  someone help you with household activities and help care for your baby for at least a few days after you leave the hospital.  Keep all follow-up visits for you and your baby as told by your health care provider. This is important. Contact a health care provider if:  You have: ? Vaginal discharge that has a bad smell. ? Difficulty urinating. ? Pain when urinating. ? A sudden increase or decrease in the frequency of your bowel movements. ? More redness, swelling, or pain around your episiotomy or vaginal tear. ? More fluid or blood coming from your episiotomy or vaginal tear. ? Pus or a bad smell coming from your episiotomy or vaginal tear. ? A fever. ? A rash. ? Little or no interest in activities you used to enjoy. ? Questions about caring for yourself or your baby.  Your episiotomy or vaginal tear feels warm to the touch.  Your episiotomy or vaginal tear is separating or does not appear to be healing.  Your breasts are painful, hard, or turn red.  You feel unusually sad or worried.  You feel nauseous or you vomit.  You pass large blood clots from your vagina. If you pass a blood clot from your vagina, save it to show to your health care provider. Do not flush blood clots down the toilet without having your health care provider look at them.  You urinate more than usual.  You are dizzy or light-headed.  You have not breastfed at all and you have not had a menstrual period for 12 weeks after delivery.  You have stopped breastfeeding and you have not had a menstrual period for 12 weeks after you stopped breastfeeding. Get help right away if:  You have: ? Pain that does not go away or does not get better with medicine. ? Chest pain. ? Difficulty breathing. ? Blurred vision or spots in your vision. ? Thoughts about hurting yourself or your baby.  You develop pain in your abdomen or in one of your legs.  You develop a severe headache.  You faint.  You bleed from your  vagina so much that you fill two sanitary pads in one hour. This information is not intended to replace advice given to you by your health care provider. Make sure you discuss any questions you have with your health care provider. Document Released: 05/22/2000 Document Revised: 11/06/2015 Document Reviewed: 06/09/2015 Elsevier Interactive Patient Education  2018 [place]. Postpartum Hypertension Postpartum hypertension  is high blood pressure after pregnancy that remains higher than normal for more than two days after delivery. You may not realize that you have postpartum hypertension if your blood pressure is not being checked regularly. In some cases, postpartum hypertension will go away on its own, usually within a week of delivery. However, for some women, medical treatment is required to prevent serious complications, such as seizures or stroke. The following things can affect your blood pressure:  The type of delivery you had.  Having received IV fluids or other medicines during or after delivery.  What are the causes? Postpartum hypertension may be caused by any of the following or by a combination of any of the following:  Hypertension that existed before pregnancy (chronic hypertension).  Gestational hypertension.  Preeclampsia or eclampsia.  Receiving a lot of fluid through an IV during or after delivery.  Medicines.  HELLP syndrome.  Hyperthyroidism.  Stroke.  Other rare neurological or blood disorders.  In some cases, the cause may not be known. What increases the risk? Postpartum hypertension can be related to one or more risk factors, such as:  Chronic hypertension. In some cases, this may not have been diagnosed before pregnancy.  Obesity.  Type 2 diabetes.  Kidney disease.  Family history of preeclampsia.  Other medical conditions that cause hormonal imbalances.  What are the signs or symptoms? As with all types of hypertension, postpartum  hypertension may not have any symptoms. Depending on how high your blood pressure is, you may experience:  Headaches. These may be mild, moderate, or severe. They may also be steady, constant, or sudden in onset (thunderclap headache).  Visual changes.  Dizziness.  Shortness of breath.  Swelling of your hands, feet, lower legs, or face. In some cases, you may have swelling in more than one of these locations.  Heart palpitations or a racing heartbeat.  Difficulty breathing while lying down.  Decreased urination.  Other rare signs and symptoms may include:  Sweating more than usual. This lasts longer than a few days after delivery.  Chest pain.  Sudden dizziness when you get up from sitting or lying down.  Seizures.  Nausea or vomiting.  Abdominal pain.  How is this diagnosed? The diagnosis of postpartum hypertension is made through a combination of physical examination findings and testing of your blood and urine. You may also have additional tests, such as a CT scan or an MRI, to check for other complications of postpartum hypertension. How is this treated? When blood pressure is high enough to require treatment, your options may include:  Medicines to reduce blood pressure (antihypertensives). Tell your health care provider if you are breastfeeding or if you plan to breastfeed. There are many antihypertensive medicines that are safe to take while breastfeeding.  Stopping medicines that may be causing hypertension.  Treating medical conditions that are causing hypertension.  Treating the complications of hypertension, such as seizures, stroke, or kidney problems.  Your health care provider will also continue to monitor your blood pressure closely and repeatedly until it is within a safe range for you. Follow these instructions at home:  Take medicines only as directed by your health care provider.  Get regular exercise after your health care provider tells you that  it is safe.  Follow your health care providers recommendations on fluid and salt restrictions.  Do not use any tobacco products, including cigarettes, chewing tobacco, or electronic cigarettes. If you need help quitting, ask your health care provider.  Keep all  follow-up visits as directed by your health care provider. This is important. Contact a health care provider if:  Your symptoms get worse.  You have new symptoms, such as: ? Headache. ? Dizziness. ? Visual changes. Get help right away if:  You develop a severe or sudden headache.  You have seizures.  You develop numbness or weakness on one side of your body.  You have difficulty thinking, speaking, or swallowing.  You develop severe abdominal pain.  You develop difficulty breathing, chest pain, a racing heartbeat, or heart palpitations. These symptoms may represent a serious problem that is an emergency. Do not wait to see if the symptoms will go away. Get medical help right away. Call your local emergency services (911 in the [place] Do not drive yourself to the hospital. This information is not intended to replace advice given to you by your health care provider. Make sure you discuss any questions you have with your health care provider. Document Released: 01/26/2014 Document Revised: 10/28/2015 Document Reviewed: 12/07/2013 Elsevier Interactive Patient Education  [employer].

## 2018-01-04 ENCOUNTER — Telehealth: Payer: Self-pay | Admitting: General Practice

## 2018-01-04 NOTE — Telephone Encounter (Signed)
Left message for patient to give our office a call back to schedule BP check.  Patient was discharged on 01/03/18.

## 2018-01-05 ENCOUNTER — Encounter (HOSPITAL_COMMUNITY): Payer: Self-pay

## 2018-01-10 ENCOUNTER — Telehealth: Payer: Self-pay | Admitting: *Deleted

## 2018-01-10 NOTE — Telephone Encounter (Signed)
Received message from [facility] @ [employer]. He stated that pt has presented a prescription for Percocet written by Druscilla Brownie (resident) and there is no DEA # on the Rx. Per chart review, pt had vaginal delivery on 7/27 and was D/C from hospital on 7/29. Per consult with Dr. Nehemiah Settle, if pt is having severe pain now (9 days past delivery) she needs to be seen for evaluation. I called [facility] and provided this information and not to fill the Rx. He voiced understanding and stated that he would pass the information along.

## 2018-01-14 ENCOUNTER — Ambulatory Visit: Payer: Medicaid Other

## 2018-02-08 ENCOUNTER — Ambulatory Visit: Payer: Medicaid Other | Admitting: Nurse Practitioner

## 2018-02-09 ENCOUNTER — Ambulatory Visit (INDEPENDENT_AMBULATORY_CARE_PROVIDER_SITE_OTHER): Payer: Medicaid Other

## 2018-02-09 DIAGNOSIS — Z1389 Encounter for screening for other disorder: Secondary | ICD-10-CM

## 2018-02-09 MED ORDER — NORETHINDRONE 0.35 MG PO TABS: 1.0000 | ORAL_TABLET | Freq: Every day | ORAL | 11 refills | Status: DC

## 2018-02-09 NOTE — Progress Notes (Signed)
Subjective:     Katelind Pytel is a 37 y.o. female who presents for a postpartum visit. She is [redacted]w[redacted]d weeks postpartum following a *VBAC**. I have fully reviewed the prenatal and intrapartum course. The delivery was at 39/2 gestational weeks. Outcome: vaginal birth after cesarean (VBAC). Anesthesia: none. Postpartum course has been uncomplicated. Baby's course has been uncomplicated. Baby is feeding by both breast and bottle - Enfamil AR. Bleeding thin lochia. Bowel function is abnormal: Constipated. Bladder function is normal. Patient is sexually active. Contraception method is none. Postpartum depression screening: negative.  The following portions of the patient's history were reviewed and updated as appropriate: allergies, current medications, past family history, past medical history, past social history, past surgical history and problem list.  Review of Systems Pertinent items noted in HPI and remainder of comprehensive ROS otherwise negative.   Objective:    There were no vitals taken for this visit.  General:  alert, cooperative and no distress   Breasts:  inspection negative, no nipple discharge or bleeding, no masses or nodularity palpable  Lungs: clear to auscultation bilaterally  Heart:  regular rate and rhythm, S1, S2 normal, no murmur, click, rub or gallop  Abdomen: soft, non-tender; bowel sounds normal; no masses,  no organomegaly   Vulva:  not evaluated  Vagina: not evaluated  Cervix:  not evaluated  Corpus: not examined  Adnexa:  not evaluated  Rectal Exam: Not performed.        Assessment:     Normal postpartum exam. Pap smear not done at today's visit. Last pap 05/2017, normal  Plan:    1. Contraception: oral progesterone-only contraceptive 2. Follow up in: 1 year or as needed.    Wende Mott, CNM 02/09/18 11:13 AM

## 2018-02-09 NOTE — Progress Notes (Signed)
Pt states Monday had migraine but since then when she moves her eyes a lot gets a dizzy feeling. Pt wants to discuss the different options of Birth Control.

## 2018-05-09 ENCOUNTER — Encounter (HOSPITAL_COMMUNITY): Payer: Self-pay | Admitting: Emergency Medicine

## 2018-05-09 ENCOUNTER — Other Ambulatory Visit: Payer: Self-pay

## 2018-05-09 ENCOUNTER — Ambulatory Visit (HOSPITAL_COMMUNITY)
Admission: EM | Admit: 2018-05-09 | Discharge: 2018-05-09 | Disposition: A | Discharge status: Home / Self Care | Payer: Medicaid Other | Attending: Family Medicine | Admitting: Family Medicine

## 2018-05-09 DIAGNOSIS — M546 Pain in thoracic spine: Secondary | ICD-10-CM

## 2018-05-09 MED ORDER — METHOCARBAMOL 500 MG PO TABS: 500.0000 mg | ORAL_TABLET | Freq: Two times a day (BID) | ORAL | 0 refills | Status: DC

## 2018-05-09 MED ORDER — MELOXICAM 7.5 MG PO TABS: 7.5000 mg | ORAL_TABLET | Freq: Every day | ORAL | 0 refills | Status: DC

## 2018-05-09 NOTE — ED Provider Notes (Signed)
[facility]    CSN: [id number] Arrival date & time: 05/09/18  6160     History   Chief Complaint Chief Complaint  Patient presents with  . Back Pain    HPI Brenda Colon is a 37 y.o. female.   37 year old female comes in for 4-day history of left shoulder/upper back pain.  Denies injury/trauma.  Pain is constant without any waxing or waning.  States occasionally will radiate down the back.  Denies numbness/tingling.  No loss of grip strength.  States has been working 2 jobs, and has required heavy lifting and strenuous activity.  She has tried topical analgesics without relief.  Denies breast-feeding.  Patient states she has left lower back pain that is chronic in nature, states "I can handle this pain" and is in for the left thoracic pain that is new. Denies saddle anesthesia, loss of bladder or bowel control.     Past Medical History:  Diagnosis Date  . Hypertension   . STD (sexually transmitted disease)    [place]  . Uterine fibroid     Patient Active Problem List   Diagnosis Date Noted  . Chronic hypertension affecting pregnancy 12/30/2017  . Family history of microcephaly 09/14/2017  . Fibroid 08/15/2017  . Chronic hypertension during pregnancy, antepartum 03/26/2017  . GERD with esophagitis 03/26/2017    Past Surgical History:  Procedure Laterality Date  . CYSTECTOMY     Wrist  . INDUCED ABORTION      OB History    Gravida  3   Para  1   Term  1   Preterm      AB  2   Living  1     SAB  1   TAB  1   Ectopic      Multiple  0   Live Births  1            Home Medications    Prior to Admission medications   Medication Sig Start Date End Date Taking? Authorizing Provider  FOLIC ACID PO Take by mouth.    [provider]  ibuprofen (ADVIL,MOTRIN) 600 MG tablet Take 1 tablet (600 mg total) by mouth every 6 (six) hours. 01/03/18   Justus Memory, MD  meloxicam (MOBIC) 7.5 MG tablet Take 1 tablet (7.5 mg total)  by mouth daily. 05/09/18   Tasia Catchings,  V, PA-C  methocarbamol (ROBAXIN) 500 MG tablet Take 1 tablet (500 mg total) by mouth 2 (two) times daily. 05/09/18   Tasia Catchings,  V, PA-C  Multiple Vitamin (MULTIVITAMIN) tablet Take 1 tablet by mouth daily.    [provider]  NIFEdipine (PROCARDIA-XL/ADALAT-CC/NIFEDICAL-XL) 30 MG 24 hr tablet Take by mouth once per day. 12/02/17   [name], MD  norethindrone (MICRONOR,CAMILA,ERRIN) 0.35 MG tablet Take 1 tablet (0.35 mg total) by mouth daily. 02/09/18   Wende Mott, CNM  ranitidine (ZANTAC) 150 MG tablet Take 150 mg by mouth 2 (two) times daily. 11/14/17   [provider]    Family History Family History  Problem Relation Age of Onset  . Diabetes Mother   . Diabetes Father   . Lupus Sister     Social History Social History   Tobacco Use  . Smoking status: Former Research scientist (life sciences)  . Smokeless tobacco: Never Used  Substance Use Topics  . Alcohol use: No    Frequency: Never  . Drug use: No     Allergies   Patient has no known allergies.   Review  of Systems Review of Systems  Reason unable to perform ROS: See HPI as above.     Physical Exam Triage Vital Signs ED Triage Vitals  Enc Vitals Group     BP 05/09/18 0841 (!) 152/112     Pulse Rate 05/09/18 0841 73     Resp --      Temp 05/09/18 0841 (!) 96.9 F (36.1 C)     Temp Source 05/09/18 0841 Oral     SpO2 05/09/18 0841 100 %     Weight --      Height --      Head Circumference --      Peak Flow --      Pain Score 05/09/18 0839 10     Pain Loc --      Pain Edu? --      Excl. in [place]? --    No data found.  Updated Vital Signs BP (!) 152/112 (BP Location: Left Arm)   Pulse 73   Temp (!) 96.9 F (36.1 C) (Oral)   LMP 05/06/2018 (Exact Date)   SpO2 100%   Physical Exam  Constitutional: She is oriented to person, place, and time. She appears well-developed and well-nourished. No distress.  HENT:  Head: Normocephalic and atraumatic.  Eyes: Pupils are equal,  round, and reactive to light. Conjunctivae are normal.  Cardiovascular: Normal rate, regular rhythm and normal heart sounds. Exam reveals no gallop and no friction rub.  No murmur heard. Pulmonary/Chest: Effort normal and breath sounds normal. No accessory muscle usage or stridor. No respiratory distress. She has no decreased breath sounds. She has no wheezes. She has no rhonchi. She has no rales.  Musculoskeletal:  No tenderness on palpation of the spinous processes. Tenderness to palpation of left mid thoracic back. Full range of motion of neck, shoulder, elbow, back, hip. Strength normal and equal bilaterally. Normal grip strength. Sensation intact and equal bilaterally. Negative straight leg raise.  Radial pulses 2+ and equal bilaterally. Capillary refill less than 2 seconds.   Neurological: She is alert and oriented to person, place, and time.  Skin: Skin is warm and dry. She is not diaphoretic.     UC Treatments / Results  Labs (all labs ordered are listed, but only abnormal results are displayed) Labs Reviewed - No data to display  EKG None  Radiology No results found.  Procedures Procedures (including critical care time)  Medications Ordered in UC Medications - No data to display  Initial Impression / Assessment and Plan / UC Course  I have reviewed the triage vital signs and the nursing notes.  Pertinent labs & imaging results that were available during my care of the patient were reviewed by me and considered in my medical decision making (see chart for details).    Start NSAID as directed for pain and inflammation. Muscle relaxant as needed. Ice/heat compresses. Discussed with patient this can take up to 3-4 weeks to resolve, but should be getting better each week. Return precautions given.   Final Clinical Impressions(s) / UC Diagnoses   Final diagnoses:  Acute left-sided thoracic back pain    ED Prescriptions    Medication Sig Dispense Auth. Provider    meloxicam (MOBIC) 7.5 MG tablet Take 1 tablet (7.5 mg total) by mouth daily. 15 tablet ,  V, PA-C   methocarbamol (ROBAXIN) 500 MG tablet Take 1 tablet (500 mg total) by mouth 2 (two) times daily. 20 tablet Ok Edwards, PA-C  Ok Edwards, PA-C 05/09/18 [phone]

## 2018-05-09 NOTE — Discharge Instructions (Signed)
Start Mobic. Do not take ibuprofen (motrin/advil)/ naproxen (aleve) while on mobic. You can take tylenol in addition to help with the pain. Robaxin as needed, this can make you drowsy, so do not take if you are going to drive, operate heavy machinery, or make important decisions. Ice/heat compresses as needed. This can take up to 3-4 weeks to completely resolve, but you should be feeling better each week. Follow up here or with PCP if symptoms worsen, changes for reevaluation. If experience numbness/tingling of the inner thighs, loss of bladder or bowel control, loss of grip strength, go to the emergency department for evaluation.

## 2018-05-09 NOTE — ED Triage Notes (Signed)
Pt reports left lower back pain that radiates into her left hip that started on Thursday.  She states this has been a recurrent issue.  The pain is now radiating up the middle of her back making it hard for her to sit and stand.  Pt has been trying OTC muscle cream with no relief.

## 2018-12-22 ENCOUNTER — Ambulatory Visit (HOSPITAL_COMMUNITY)
Admission: EM | Admit: 2018-12-22 | Discharge: 2018-12-22 | Disposition: A | Discharge status: Home / Self Care | Payer: Self-pay | Attending: Emergency Medicine | Admitting: Emergency Medicine

## 2018-12-22 ENCOUNTER — Other Ambulatory Visit: Payer: Self-pay

## 2018-12-22 ENCOUNTER — Encounter (HOSPITAL_COMMUNITY): Payer: Self-pay

## 2018-12-22 DIAGNOSIS — I10 Essential (primary) hypertension: Secondary | ICD-10-CM

## 2018-12-22 MED ORDER — AMLODIPINE BESYLATE 5 MG PO TABS: 5.0000 mg | ORAL_TABLET | Freq: Every day | ORAL | 0 refills | Status: DC

## 2018-12-22 NOTE — Discharge Instructions (Signed)
Please see provided education about things you can do to help manage your high blood pressure.  Please start medication I have sent to the pharmacy, it looks like you have been on this in the past.  Please call to set up follow up appointment with a PCP for recheck and management of your blood pressure.  Return to be seen for any persistent/severe headache, vision changes, chest pain , leg swelling or otherwise concerned.

## 2018-12-22 NOTE — ED Triage Notes (Signed)
Patient presents to Urgent Care with complaints of htn since 2-3 days ago. Patient reports she went to get dental work done and the readings were 179/120 and 214/160. Pt ran out of her HTN meds in January of this year and then her health insurance was cancelled.

## 2018-12-22 NOTE — ED Provider Notes (Signed)
[facility]    CSN: [id number] Arrival date & time: 12/22/18  1525      History   Chief Complaint Chief Complaint  Patient presents with   Hypertension    HPI Brenda Colon is a 38 y.o. female.   Brenda Colon presents with complaints of high blood pressure. She has had this diagnosis in the past, but no longer follows with a PCP or takes any medications for this. Per chart review has been on amlodipine in the past, and was on procardia during pregnancy. She went to the dentist three days ago and her BP was 214/160 therefore she was unable to have her procedure completed. States she has occasional headaches which resolve when she takes [facility]. No current headache. No vision changes. No chest pain  Or shortness of breath . No leg pain or swelling.    ROS per HPI, negative if not otherwise mentioned.      Past Medical History:  Diagnosis Date   Hypertension    STD (sexually transmitted disease)    [place]   Uterine fibroid     Patient Active Problem List   Diagnosis Date Noted   Chronic hypertension affecting pregnancy 12/30/2017   Family history of microcephaly 09/14/2017   Fibroid 08/15/2017   Chronic hypertension during pregnancy, antepartum 03/26/2017   GERD with esophagitis 03/26/2017    Past Surgical History:  Procedure Laterality Date   CYSTECTOMY     Wrist   INDUCED ABORTION      OB History    Gravida  3   Para  1   Term  1   Preterm      AB  2   Living  1     SAB  1   TAB  1   Ectopic      Multiple  0   Live Births  1            Home Medications    Prior to Admission medications   Medication Sig Start Date End Date Taking? Authorizing Provider  amLODipine (NORVASC) 5 MG tablet Take 1 tablet (5 mg total) by mouth daily. 12/22/18   Augusto Gamble B, NP  FOLIC ACID PO Take by mouth.    [provider]  ibuprofen (ADVIL,MOTRIN) 600 MG tablet Take 1 tablet (600 mg total) by mouth every 6 (six)  hours. 01/03/18   Justus Memory, MD  Multiple Vitamin (MULTIVITAMIN) tablet Take 1 tablet by mouth daily.    [provider]  norethindrone (MICRONOR,CAMILA,ERRIN) 0.35 MG tablet Take 1 tablet (0.35 mg total) by mouth daily. 02/09/18   Wende Mott, CNM  ranitidine (ZANTAC) 150 MG tablet Take 150 mg by mouth 2 (two) times daily. 11/14/17   [provider]  NIFEdipine (PROCARDIA-XL/ADALAT-CC/NIFEDICAL-XL) 30 MG 24 hr tablet Take by mouth once per day. 12/02/17 12/22/18  [name], MD    Family History Family History  Problem Relation Age of Onset   Diabetes Mother    Diabetes Father    Lupus Sister     Social History Social History   Tobacco Use   Smoking status: Former Smoker   Smokeless tobacco: Never Used  Substance Use Topics   Alcohol use: No    Frequency: Never   Drug use: No     Allergies   Patient has no known allergies.   Review of Systems Review of Systems   Physical Exam Triage Vital Signs ED Triage Vitals  Enc Vitals Group  BP 12/22/18 1555 (!) 164/124     Pulse Rate 12/22/18 1555 79     Resp 12/22/18 1555 17     Temp 12/22/18 1555 (!) 97.5 F (36.4 C)     Temp Source 12/22/18 1555 Oral     SpO2 12/22/18 1555 100 %     Weight --      Height --      Head Circumference --      Peak Flow --      Pain Score 12/22/18 1553 0     Pain Loc --      Pain Edu? --      Excl. in [place]? --    No data found.  Updated Vital Signs BP (!) 164/124 (BP Location: Left Arm)    Pulse 79    Temp (!) 97.5 F (36.4 C) (Oral)    Resp 17    SpO2 100%   Visual Acuity Right Eye Distance:   Left Eye Distance:   Bilateral Distance:    Right Eye Near:   Left Eye Near:    Bilateral Near:     Physical Exam Constitutional:      General: She is not in acute distress.    Appearance: She is well-developed.  Cardiovascular:     Rate and Rhythm: Normal rate and regular rhythm.     Heart sounds: Normal heart sounds.  Pulmonary:      Effort: Pulmonary effort is normal.     Breath sounds: Normal breath sounds.  Skin:    General: Skin is warm and dry.  Neurological:     Mental Status: She is alert and oriented to person, place, and time.      UC Treatments / Results  Labs (all labs ordered are listed, but only abnormal results are displayed) Labs Reviewed - No data to display  EKG   Radiology No results found.  Procedures Procedures (including critical care time)  Medications Ordered in UC Medications - No data to display  Initial Impression / Assessment and Plan / UC Course  I have reviewed the triage vital signs and the nursing notes.  Pertinent labs & imaging results that were available during my care of the patient were reviewed by me and considered in my medical decision making (see chart for details).     Elevated BP here in clinic, patient asymptomatic. Amlodipine restarted with emphasis to establish with a PCP for recheck and management. Patient verbalized understanding and agreeable to plan.   Final Clinical Impressions(s) / UC Diagnoses   Final diagnoses:  Hypertension, unspecified type     Discharge Instructions     Please see provided education about things you can do to help manage your high blood pressure.  Please start medication I have sent to the pharmacy, it looks like you have been on this in the past.  Please call to set up follow up appointment with a PCP for recheck and management of your blood pressure.  Return to be seen for any persistent/severe headache, vision changes, chest pain , leg swelling or otherwise concerned.    ED Prescriptions    Medication Sig Dispense Auth. Provider   amLODipine (NORVASC) 5 MG tablet Take 1 tablet (5 mg total) by mouth daily. 30 tablet Zigmund Gottron, NP     Controlled Substance Prescriptions [place] Controlled Substance Registry consulted? Not Applicable   Zigmund Gottron, NP 12/22/18 1626

## 2019-01-12 ENCOUNTER — Other Ambulatory Visit: Payer: Self-pay

## 2019-01-12 ENCOUNTER — Ambulatory Visit: Payer: Self-pay

## 2019-01-12 ENCOUNTER — Ambulatory Visit: Payer: Self-pay | Attending: Family Medicine | Admitting: Physician Assistant

## 2019-01-12 DIAGNOSIS — Z131 Encounter for screening for diabetes mellitus: Secondary | ICD-10-CM

## 2019-01-12 DIAGNOSIS — I10 Essential (primary) hypertension: Secondary | ICD-10-CM

## 2019-01-12 DIAGNOSIS — Z13220 Encounter for screening for lipoid disorders: Secondary | ICD-10-CM

## 2019-01-12 DIAGNOSIS — Z09 Encounter for follow-up examination after completed treatment for conditions other than malignant neoplasm: Secondary | ICD-10-CM

## 2019-01-12 MED ORDER — AMLODIPINE BESYLATE 10 MG PO TABS: 10.0000 mg | ORAL_TABLET | Freq: Every day | ORAL | 3 refills | Status: DC

## 2019-01-12 MED FILL — AMLODIPINE BESYLATE 10 MG T: 10 | 30 days supply | Qty: 30 | Fill #0

## 2019-01-12 NOTE — Progress Notes (Signed)
Virtual Visit via Telephone Note  I connected with Brenda Colon on 01/12/19 at  9:30 AM EDT by telephone and verified that I am speaking with the correct person using two identifiers.   I discussed the limitations, risks, security and privacy concerns of performing an evaluation and management service by telephone and the availability of in person appointments. I also discussed with the patient that there may be a patient responsible charge related to this service. The patient expressed understanding and agreed to proceed.  Patient location:  Parking lot of office My Location:  [facility] office Persons on the call:  Me and the patient    History of Present Illness: No CP/dizziness/SOB.  Tolerating 5 mg amlodipine w/o SE.  BP OOO ~140-150/88-96. Doing well.  No other known health problems.  No new concerns today  FH: DM-mom, sister with lupus.    From ED A/P: 12/22/2018 Elevated BP here in clinic, patient asymptomatic. Amlodipine restarted with emphasis to establish with a PCP for recheck and management.     Observations/Objective: A&Ox3   Assessment and Plan: 1. Hypertension, unspecified type Not at goal.  Stop amlodipine 5mg .  Start Amlodipine 10mg .  Check BP 2-3 times weekly and bring to f/up appt - Comprehensive metabolic panel - CBC with Differential/Platelet  2. Screening for diabetes mellitus I have had a lengthy discussion and provided education about insulin resistance and the intake of too much sugar/refined carbohydrates.  I have advised the patient to work at a goal of eliminating sugary drinks, candy, desserts, sweets, refined sugars, processed foods, and white carbohydrates.  The patient expresses understanding.  - Hemoglobin A1c  3. Screening cholesterol level - Lipid panel  4. Encounter for examination following treatment at hospital improving    Follow Up Instructions:    I discussed the assessment and treatment plan with the patient. The patient was provided an  opportunity to ask questions and all were answered. The patient agreed with the plan and demonstrated an understanding of the instructions.   The patient was advised to call back or seek an in-person evaluation if the symptoms worsen or if the condition fails to improve as anticipated.  I provided 11 minutes of non-face-to-face time during this encounter.   Freeman Caldron, PA-C  Patient ID: Brenda Colon, female   DOB: 24-Sep-1980, 38 y.o.   MRN: [id number]

## 2019-01-12 NOTE — Progress Notes (Signed)
Patient has been called and DOB has been verified. Patient has been screened and transferred to PCP to start phone visit.     

## 2019-01-13 LAB — COMPREHENSIVE METABOLIC PANEL
ALT: 11 IU/L (ref 0–32)
AST: 16 IU/L (ref 0–40)
Albumin/Globulin Ratio: 1.5 (ref 1.2–2.2)
Albumin: 4.1 g/dL (ref 3.8–4.8)
Alkaline Phosphatase: 87 IU/L (ref 39–117)
BUN/Creatinine Ratio: 9 (ref 9–23)
BUN: 8 mg/dL (ref 6–20)
Bilirubin Total: <0.2 mg/dL (ref 0.0–1.2)
CO2: 24 mmol/L (ref 20–29)
Calcium: 9.3 mg/dL (ref 8.7–10.2)
Chloride: 102 mmol/L (ref 96–106)
Creatinine, Ser: 0.9 mg/dL (ref 0.57–1.00)
GFR calc Af Amer: 94 mL/min/{1.73_m2} (ref >59)
GFR calc non Af Amer: 81 mL/min/{1.73_m2} (ref >59)
Globulin, Total: 2.8 g/dL (ref 1.5–4.5)
Glucose: 83 mg/dL (ref 65–99)
Potassium: 4.8 mmol/L (ref 3.5–5.2)
Sodium: 141 mmol/L (ref 134–144)
Total Protein: 6.9 g/dL (ref 6.0–8.5)

## 2019-01-13 LAB — CBC WITH DIFFERENTIAL/PLATELET
Basophils Absolute: 0 10*3/uL (ref 0.0–0.2)
Basos: 1 %
EOS (ABSOLUTE): 0 10*3/uL (ref 0.0–0.4)
Eos: 1 %
Hematocrit: 40.3 % (ref 34.0–46.6)
Hemoglobin: 12.6 g/dL (ref 11.1–15.9)
Immature Grans (Abs): 0 10*3/uL (ref 0.0–0.1)
Immature Granulocytes: 0 %
Lymphocytes Absolute: 2.2 10*3/uL (ref 0.7–3.1)
Lymphs: 34 %
MCH: 25.2 pg — ABNORMAL LOW (ref 26.6–33.0)
MCHC: 31.3 g/dL — ABNORMAL LOW (ref 31.5–35.7)
MCV: 81 fL (ref 79–97)
Monocytes Absolute: 0.6 10*3/uL (ref 0.1–0.9)
Monocytes: 9 %
Neutrophils Absolute: 3.6 10*3/uL (ref 1.4–7.0)
Neutrophils: 55 %
Platelets: 219 10*3/uL (ref 150–450)
RBC: 5 x10E6/uL (ref 3.77–5.28)
RDW: 16.1 % — ABNORMAL HIGH (ref 11.7–15.4)
WBC: 6.4 10*3/uL (ref 3.4–10.8)

## 2019-01-13 LAB — LIPID PANEL
Chol/HDL Ratio: 4.4 ratio (ref 0.0–4.4)
Cholesterol, Total: 196 mg/dL (ref 100–199)
HDL: 45 mg/dL (ref >39)
LDL Calculated: 138 mg/dL — ABNORMAL HIGH (ref 0–99)
Triglycerides: 63 mg/dL (ref 0–149)
VLDL Cholesterol Cal: 13 mg/dL (ref 5–40)

## 2019-01-13 LAB — HEMOGLOBIN A1C
Est. average glucose Bld gHb Est-mCnc: 108 mg/dL
Hgb A1c MFr Bld: 5.4 % (ref 4.8–5.6)

## 2019-01-19 ENCOUNTER — Encounter: Payer: Self-pay | Admitting: *Deleted

## 2019-01-20 ENCOUNTER — Telehealth: Payer: Self-pay | Admitting: General Practice

## 2019-01-20 NOTE — Telephone Encounter (Signed)
I have not tried contacting pt

## 2019-01-20 NOTE — Telephone Encounter (Signed)
Patient called back stating she has received numerous calls and is unsure in what the call sis in regards to. Please follow up.

## 2019-01-20 NOTE — Telephone Encounter (Signed)
Will forward to a medical assistant as neither the provider or medical assistant who interacted with patient at her recent visit are in the office today

## 2019-01-23 NOTE — Telephone Encounter (Signed)
Pt states returning call. Per prior notes RN attempted three times to reach pt to give lab resuls then mailed a letter. Pt has not received letter yet. Confirmed pt ph#. Also request pt to clear vm box & be available for ret call for lab results.

## 2019-01-23 NOTE — Telephone Encounter (Signed)
Please try to contact patient with lab results

## 2019-01-24 NOTE — Telephone Encounter (Signed)
Left message on voicemail to return call.   Upon return call, any available assistant is able to deliver lab results to the patient.

## 2019-02-23 ENCOUNTER — Ambulatory Visit: Payer: Self-pay | Admitting: Family Medicine

## 2019-03-27 MED FILL — AMLODIPINE BESYLATE 10 MG T: 10 | 30 days supply | Qty: 30 | Fill #1

## 2019-03-29 ENCOUNTER — Ambulatory Visit: Payer: Self-pay

## 2019-11-15 ENCOUNTER — Encounter: Payer: Self-pay | Admitting: Physician Assistant

## 2019-12-09 IMAGING — US US FETAL BPP W/ NON-STRESS
1 series · 13 of 13 positions shown · non-contrast
Comparison: none

[Series 1: us fetal bpp w/nonstress · 13 acquisitions, 13 frames shown]
[im 1/13]
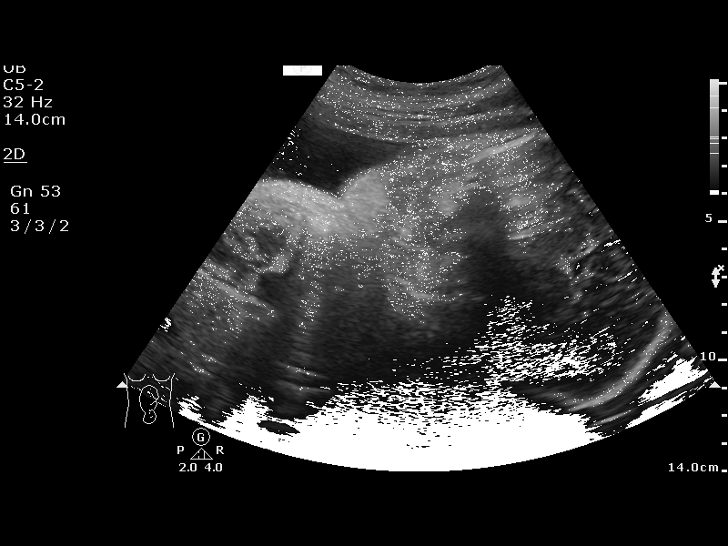
[im 2/13]
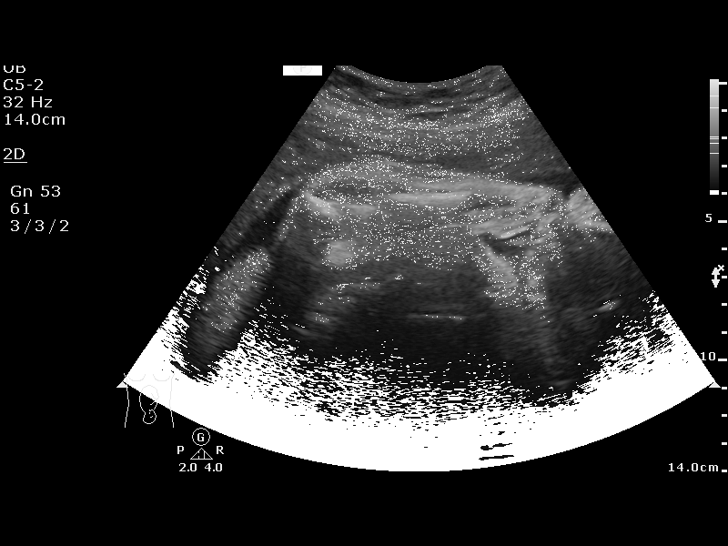
[im 3/13]
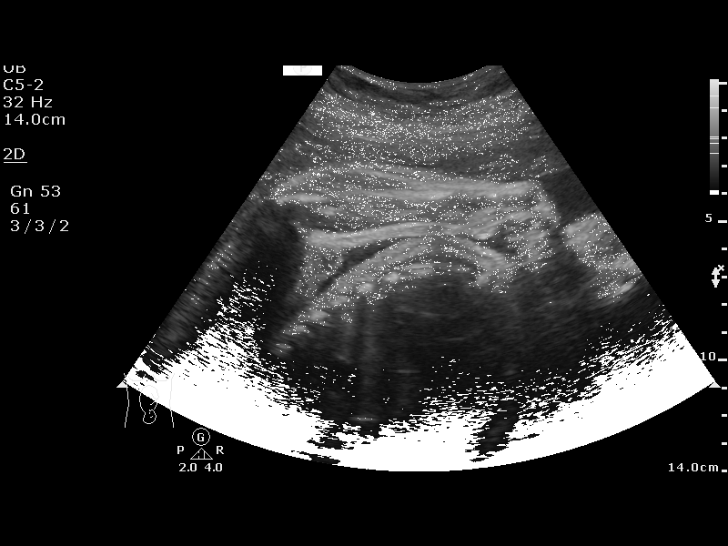
[im 4/13]
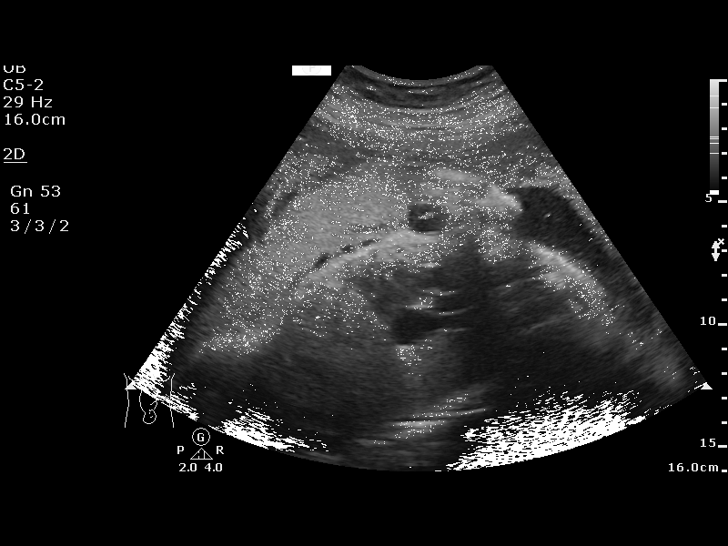
[im 5/13]
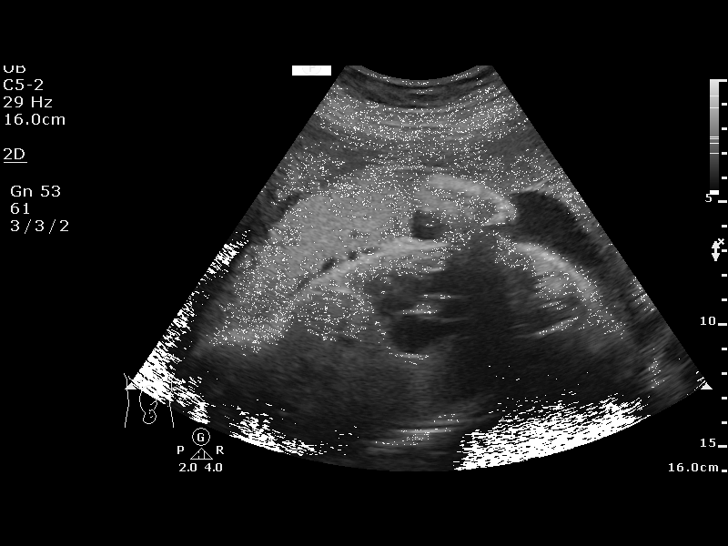
[im 6/13]
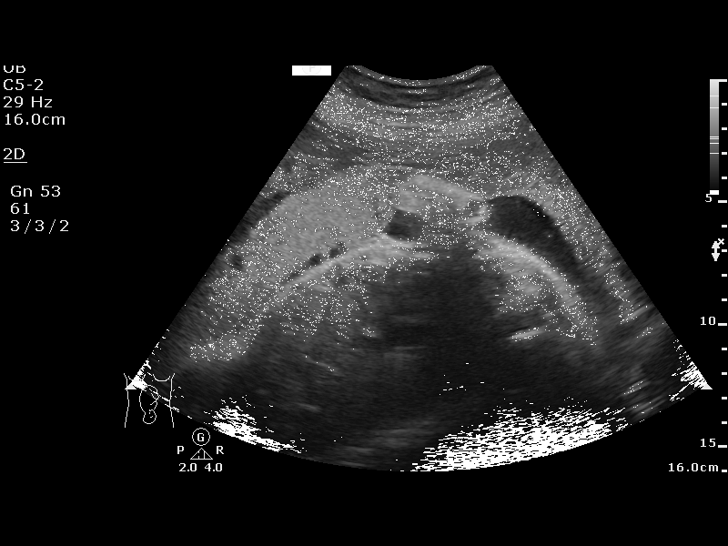
[im 7/13]
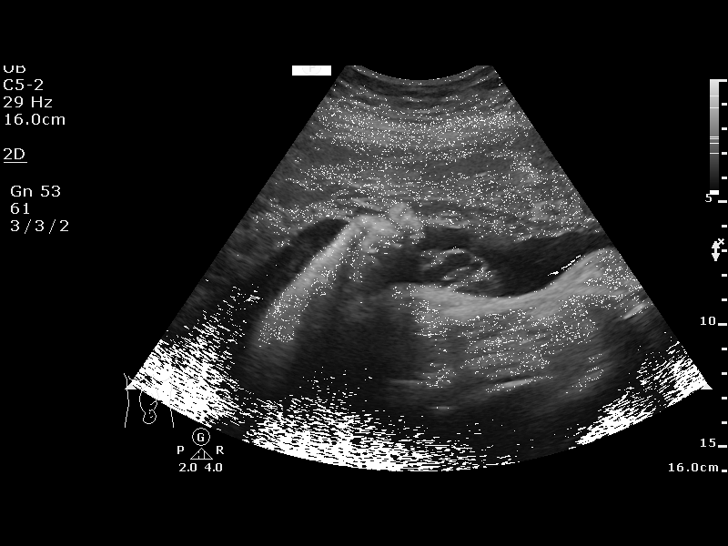
[im 8/13]
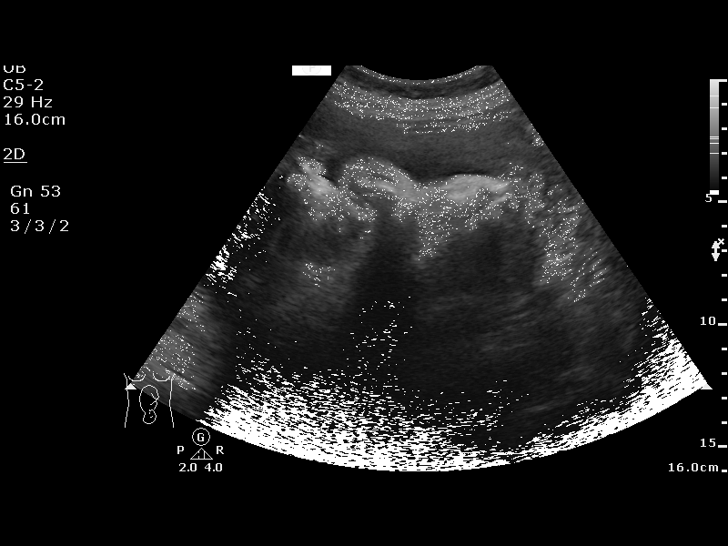
[im 9/13]
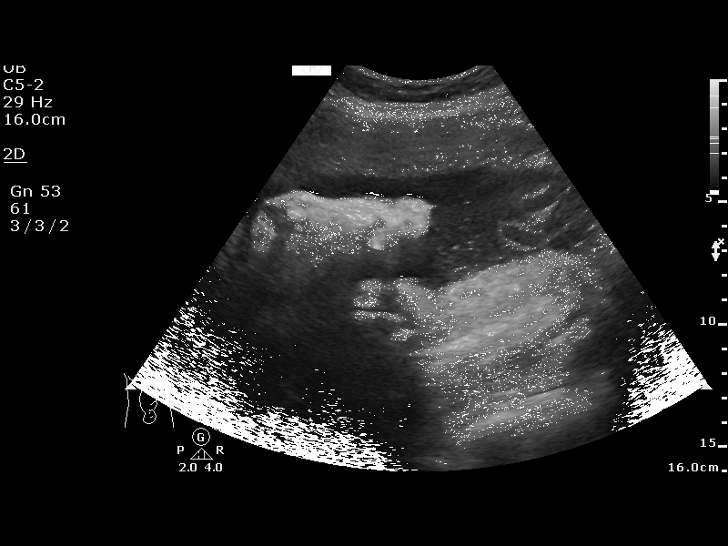
[im 10/13]
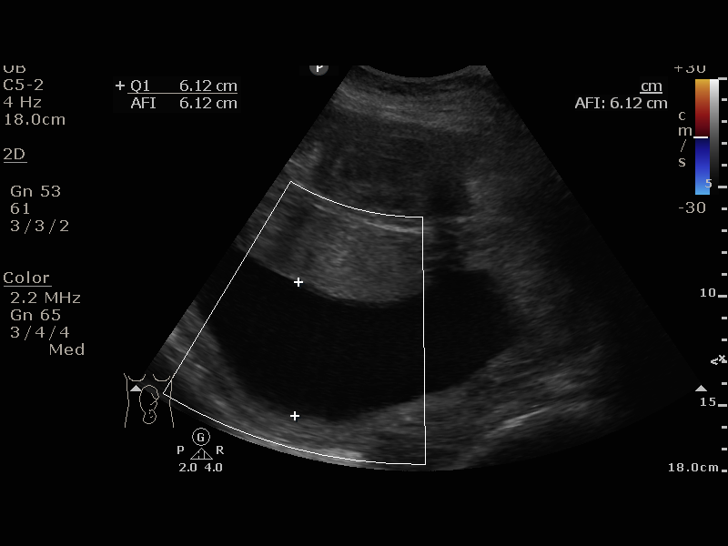
[im 11/13]
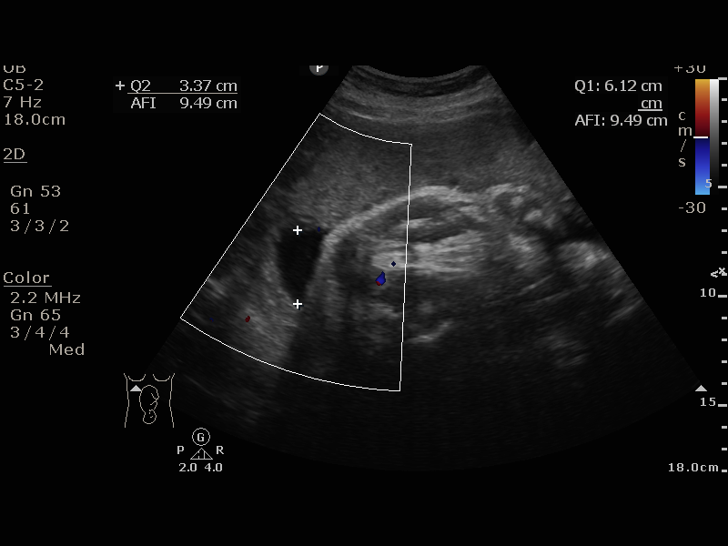
[im 12/13]
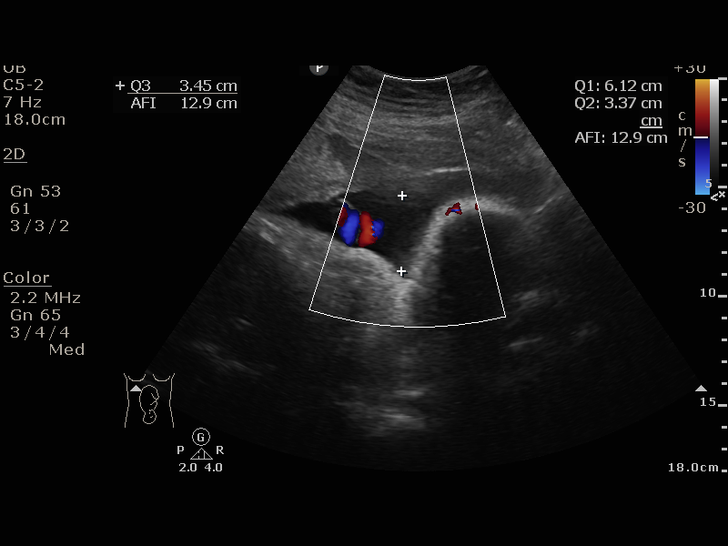
[im 13/13]
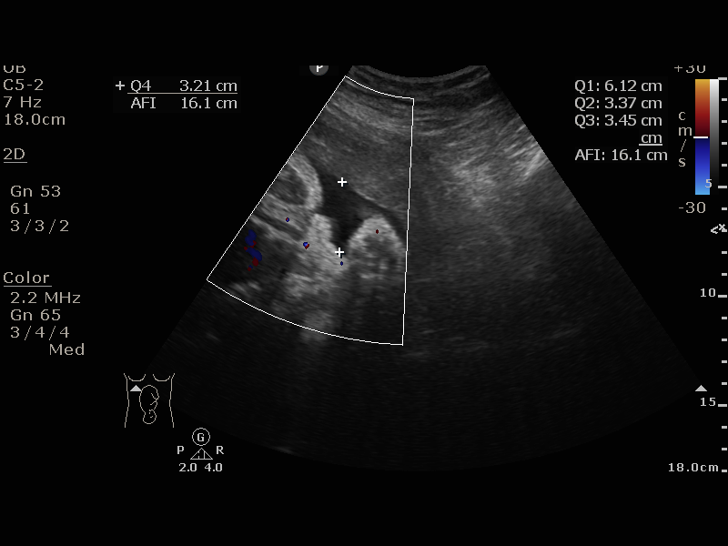

[13 of 13 positions shown; findings below may reference images not displayed]

[facility]
Women's
[REDACTED]

1  US FETAL BPP W/NONSTRESS                    76818.4

1  DENINE ALFONSO          [id number]      [phone]     [id number]
Service(s) Provided

Indications

33 weeks gestation of pregnancy
Unspecified pre-existing hypertension
complicating pregnancy, third trimester
OB History

Gravidity:    3         Term:   0        Prem:   0        SAB:   1
TOP:          1       Ectopic:  0        Living: 0
Fetal Evaluation

Num Of Fetuses:     1
Preg. Location:     Intrauterine
Cardiac Activity:   Observed
Presentation:       Cephalic

Amniotic Fluid
AFI FV:      Subjectively within normal limits

AFI Sum(cm)     %Tile       Largest Pocket(cm)
16.15           58
RUQ(cm)       RLQ(cm)       LUQ(cm)        LLQ(cm)
6.12
Biophysical Evaluation

Amniotic F.V:   Pocket => 2 cm two         F. Tone:        Observed
planes
F. Movement:    Observed                   N.S.T:          Reactive
F. Breathing:   Observed                   Score:          [DATE]
Gestational Age

LMP:           33w 5d        Date:  04/03/17                 EDD:   01/08/18
Best:          33w 5d     Det. By:  LMP  (04/03/17)          EDD:   01/08/18
Impression

Reassuring antenatal testing
Recommendations

Continue with weekly testing

## 2019-12-16 IMAGING — US US FETAL BPP W/ NON-STRESS
1 series · 13 of 13 positions shown · non-contrast
Comparison: none

[Series 1: us fetal bpp w/nonstress · 13 acquisitions, 13 frames shown]
[im 1/13]
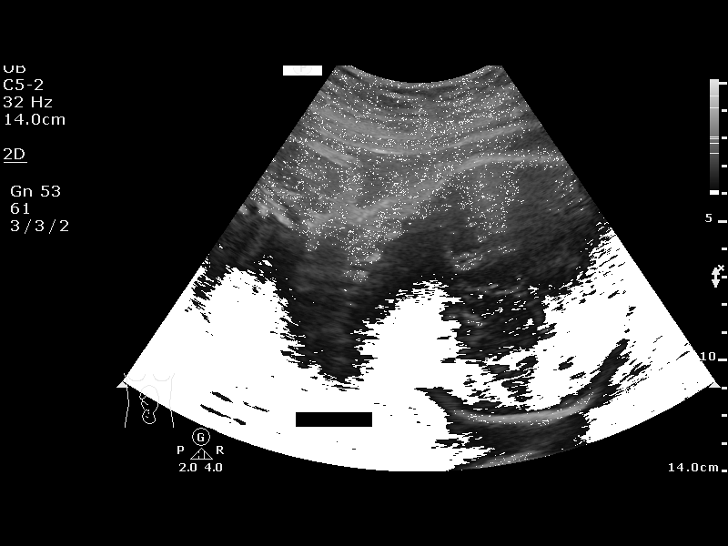
[im 2/13]
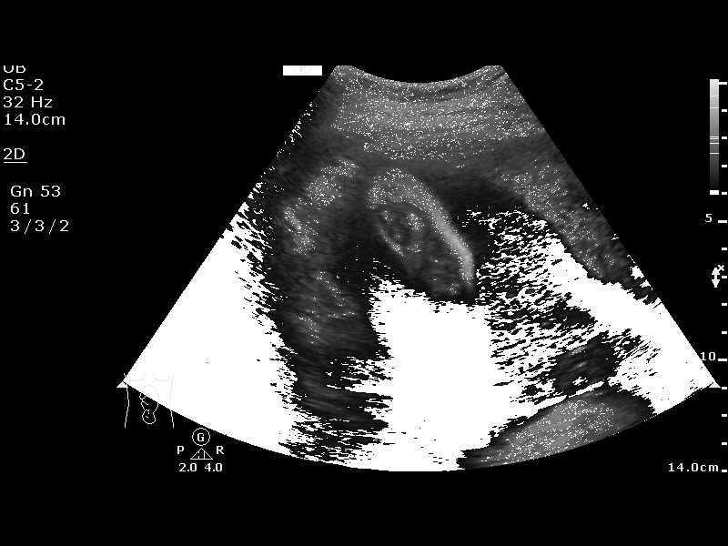
[im 3/13]
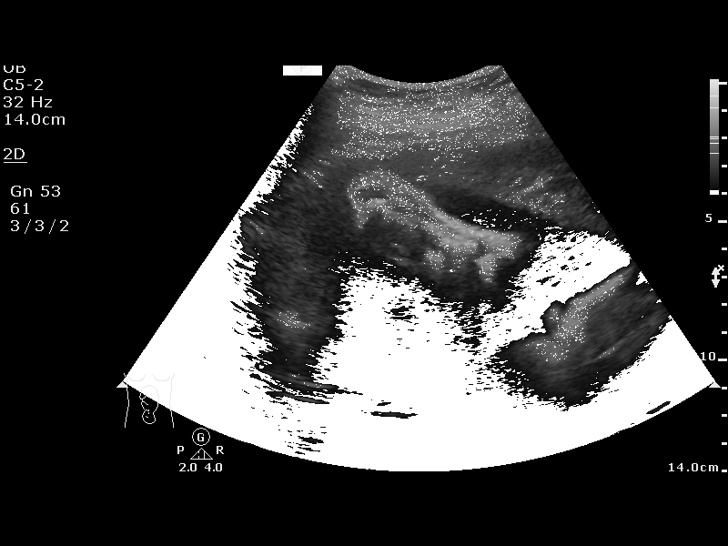
[im 4/13]
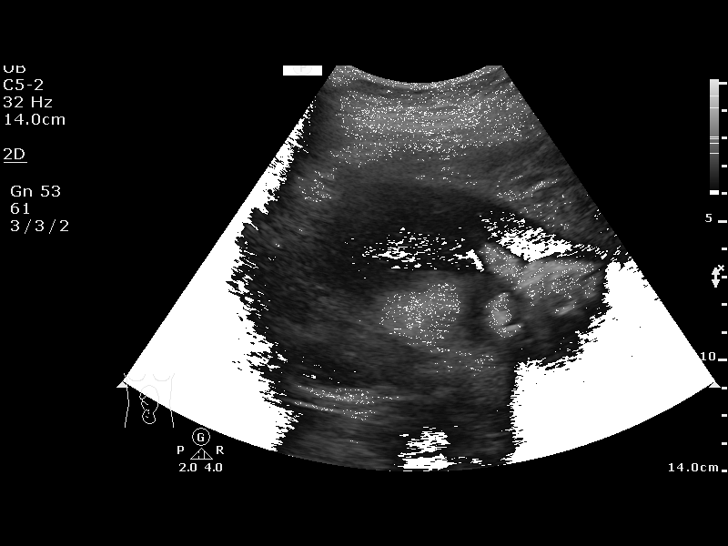
[im 5/13]
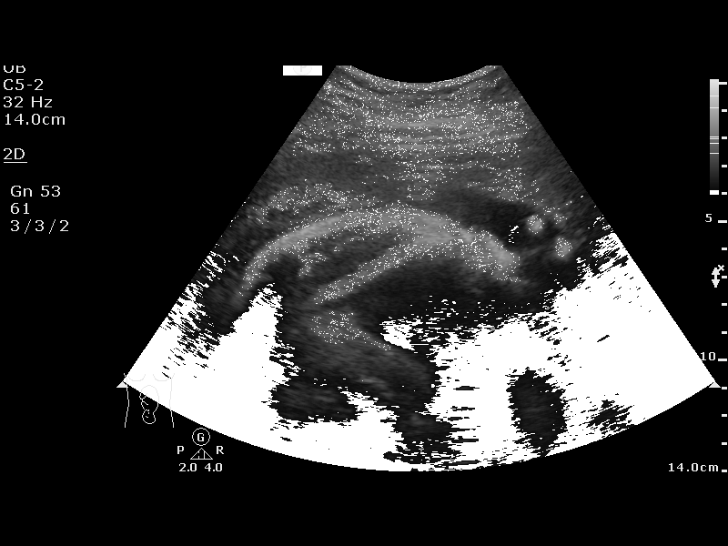
[im 6/13]
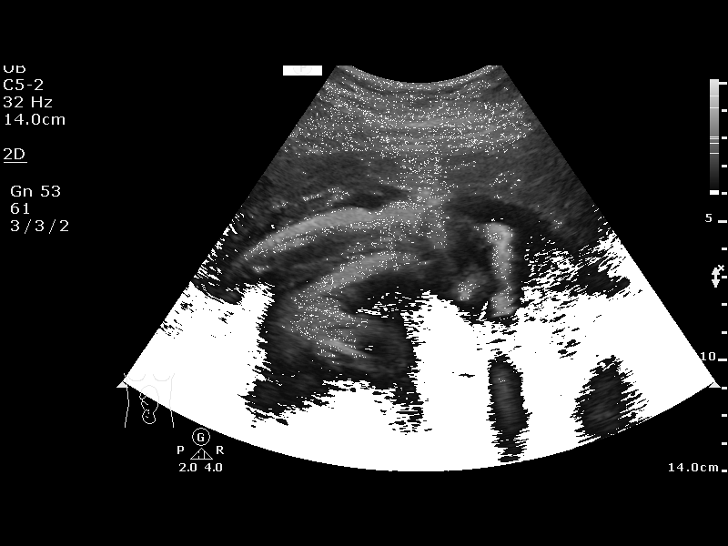
[im 7/13]
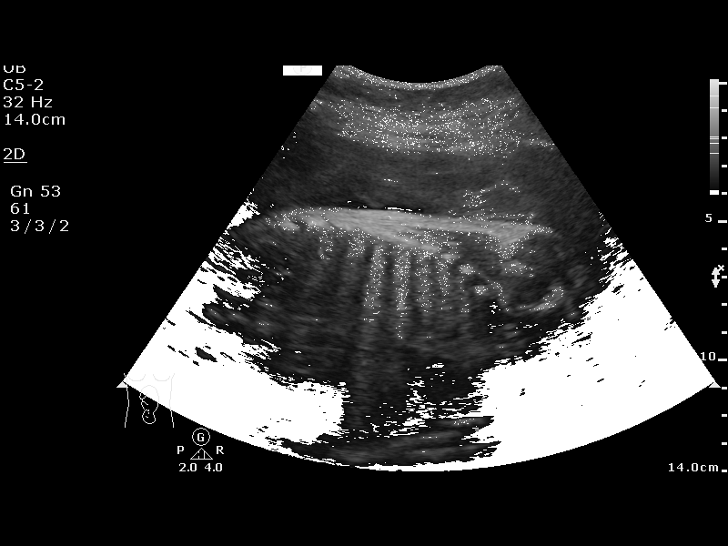
[im 8/13]
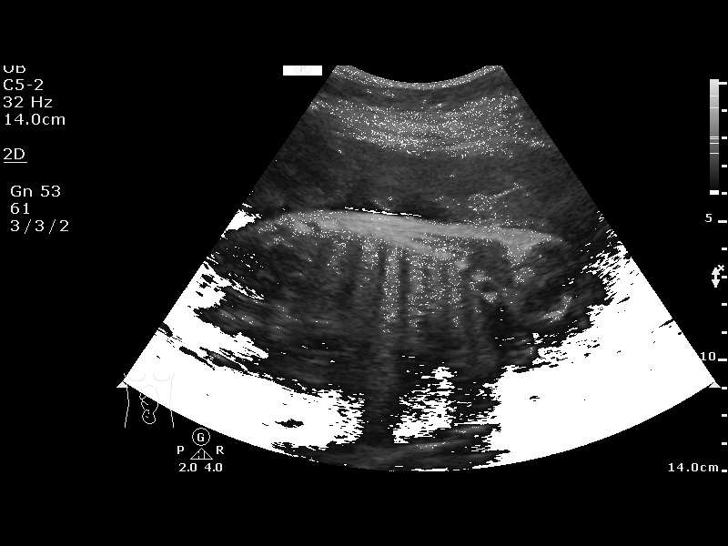
[im 9/13]
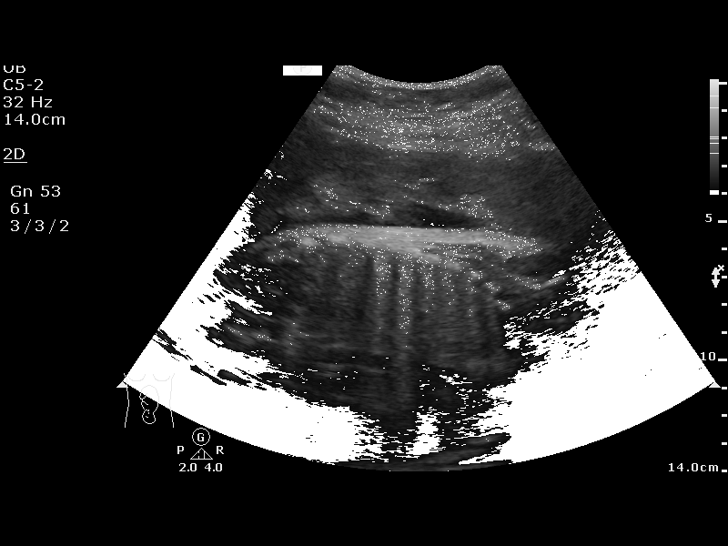
[im 10/13]
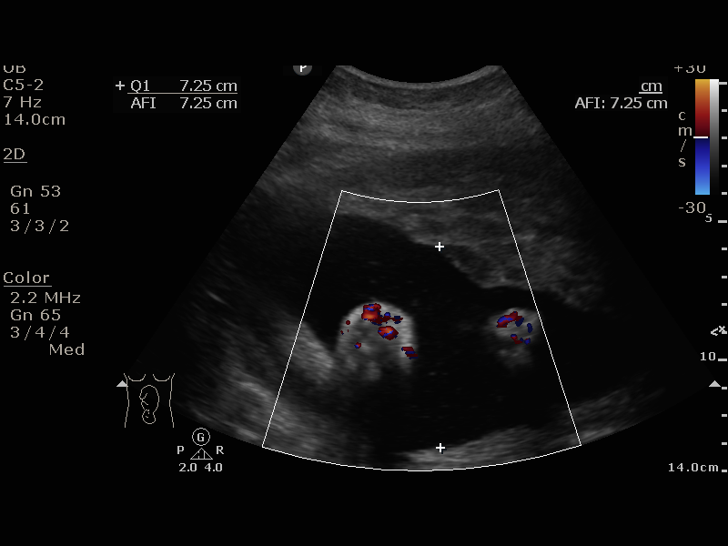
[im 11/13]
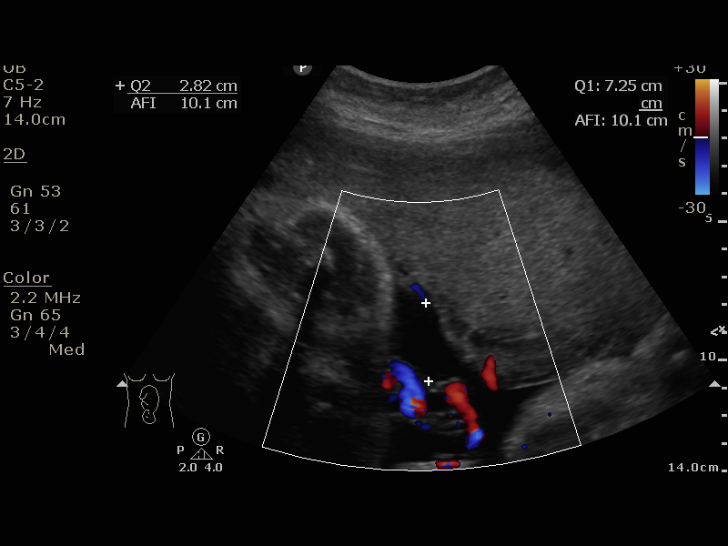
[im 12/13]
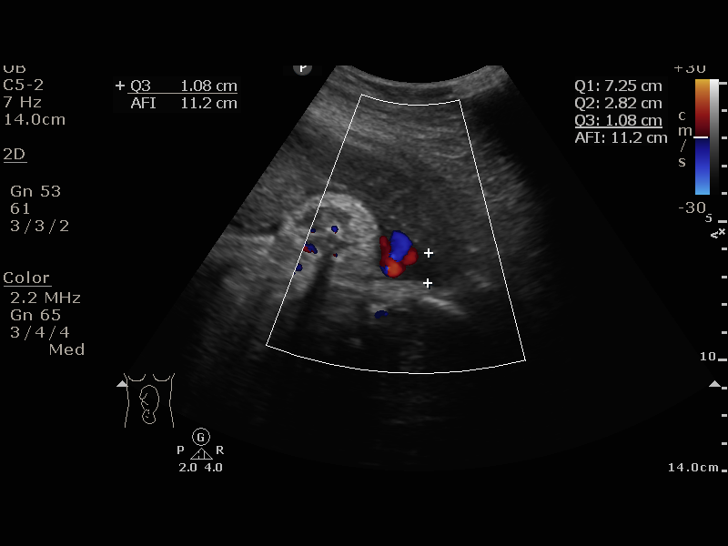
[im 13/13]
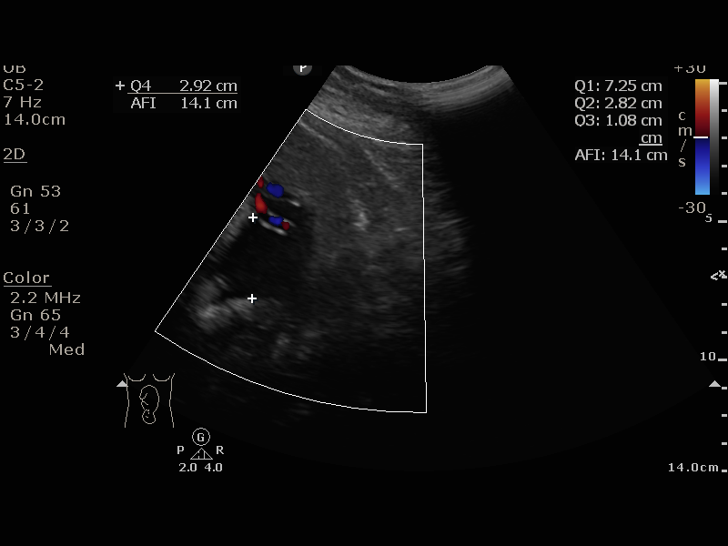

[13 of 13 positions shown; findings below may reference images not displayed]

[facility]
Women's
[REDACTED]

1  US FETAL BPP W/NONSTRESS                    76818.4

1  RUDI JUMPER            [id number]      [id number]     [id number]
Indications

34 weeks gestation of pregnancy
Unspecified pre-existing hypertension
complicating pregnancy, third trimester
OB History

Gravidity:    3         Term:   0        Prem:   0        SAB:   1
TOP:          1       Ectopic:  0        Living: 0
Fetal Evaluation

Num Of Fetuses:     1
Preg. Location:     Intrauterine
Cardiac Activity:   Observed
Fetal Lie:          Maternal left side
Presentation:       Cephalic

Amniotic Fluid
AFI FV:      Subjectively within normal limits

AFI Sum(cm)     %Tile       Largest Pocket(cm)
14.07           50

RUQ(cm)       RLQ(cm)       LUQ(cm)        LLQ(cm)
7.25
Biophysical Evaluation

Amniotic F.V:   Pocket => 2 cm two         F. Tone:        Observed
planes
F. Movement:    Observed                   N.S.T:          Reactive
F. Breathing:   Observed                   Score:          [DATE]
Gestational Age

LMP:           34w 5d        Date:  04/03/17                 EDD:   01/08/18
Best:          34w 5d     Det. By:  LMP  (04/03/17)          EDD:   01/08/18
Impression

Recommendations

Continue with antenatal testing as indicated

## 2020-01-25 ENCOUNTER — Ambulatory Visit (HOSPITAL_COMMUNITY)
Admission: EM | Admit: 2020-01-25 | Discharge: 2020-01-25 | Disposition: A | Discharge status: Home / Self Care | Payer: Self-pay | Attending: Urgent Care | Admitting: Urgent Care

## 2020-01-25 ENCOUNTER — Inpatient Hospital Stay
Admission: RE | Admit: 2020-01-25 | Discharge: 2020-01-25 | Disposition: A | Discharge status: Home / Self Care | Payer: Self-pay | Source: Ambulatory Visit

## 2020-01-25 ENCOUNTER — Other Ambulatory Visit: Payer: Self-pay

## 2020-01-25 ENCOUNTER — Inpatient Hospital Stay: Admission: RE | Admit: 2020-01-25 | Payer: Self-pay | Source: Ambulatory Visit

## 2020-01-25 ENCOUNTER — Encounter (HOSPITAL_COMMUNITY): Payer: Self-pay

## 2020-01-25 DIAGNOSIS — I10 Essential (primary) hypertension: Secondary | ICD-10-CM

## 2020-01-25 DIAGNOSIS — R519 Headache, unspecified: Secondary | ICD-10-CM

## 2020-01-25 DIAGNOSIS — U071 COVID-19: Secondary | ICD-10-CM

## 2020-01-25 DIAGNOSIS — R030 Elevated blood-pressure reading, without diagnosis of hypertension: Secondary | ICD-10-CM

## 2020-01-25 MED ORDER — AMLODIPINE BESYLATE 10 MG PO TABS: 10.0000 mg | ORAL_TABLET | Freq: Every day | ORAL | 0 refills | Status: DC

## 2020-01-25 MED ORDER — BUTALBITAL-APAP-CAFFEINE 50-325-40 MG PO TABS: 1.0000 | ORAL_TABLET | Freq: Two times a day (BID) | ORAL | 0 refills | Status: DC | PRN

## 2020-01-25 NOTE — ED Triage Notes (Addendum)
Pt c/o migraine headache x 3-4 days, has not had any pain medications today  COVID + on Tuesday

## 2020-01-25 NOTE — ED Provider Notes (Signed)
Brenda Colon   MRN: [id number] DOB: August 23, 1980  Subjective:   DAILYNN Brenda Colon is a 39 y.o. female presenting for 3-4 day hx of acute onset frontal headache that is not relieved by APAP, [employer]. Has a hx of HTN. Tested positive for COVID 2 days ago. Denies fever, confusion, dizziness, chest pain, belly pain, hematuria, weakness. Would like to trial Fioricet as she used this in 2019 while pregnant with good relief. Does not have a PCP.   No current facility-administered medications for this encounter.  Current Outpatient Medications:  .  amLODipine (NORVASC) 10 MG tablet, Take 1 tablet (10 mg total) by mouth daily., Disp: 90 tablet, Rfl: 3 .  FOLIC ACID PO, Take by mouth., Disp: , Rfl:  .  ibuprofen (ADVIL,MOTRIN) 600 MG tablet, Take 1 tablet (600 mg total) by mouth every 6 (six) hours. (Patient not taking: Reported on 01/12/2019), Disp: 30 tablet, Rfl: 0 .  Multiple Vitamin (MULTIVITAMIN) tablet, Take 1 tablet by mouth daily., Disp: , Rfl:  .  norethindrone (MICRONOR,CAMILA,ERRIN) 0.35 MG tablet, Take 1 tablet (0.35 mg total) by mouth daily. (Patient not taking: Reported on 01/12/2019), Disp: 1 Package, Rfl: 11 .  ranitidine (ZANTAC) 150 MG tablet, Take 150 mg by mouth 2 (two) times daily., Disp: , Rfl: 0   No Known Allergies  Past Medical History:  Diagnosis Date  . Hypertension   . STD (sexually transmitted disease)    [place]  . Uterine fibroid      Past Surgical History:  Procedure Laterality Date  . CYSTECTOMY     Wrist  . INDUCED ABORTION      Family History  Problem Relation Age of Onset  . Diabetes Mother   . Diabetes Father   . Lupus Sister     Social History   Tobacco Use  . Smoking status: Former Research scientist (life sciences)  . Smokeless tobacco: Never Used  Vaping Use  . Vaping Use: Never used  Substance Use Topics  . Alcohol use: No  . Drug use: No    ROS   Objective:   Vitals: BP (!) 174/121   Pulse 97   Temp 98 F (36.7 C)   Resp 16   LMP  01/07/2020   SpO2 96%   BP was 164/133 on recheck.   Physical Exam Constitutional:      General: She is not in acute distress.    Appearance: Normal appearance. She is well-developed. She is obese. She is not ill-appearing, toxic-appearing or diaphoretic.  HENT:     Head: Normocephalic and atraumatic.     Right Ear: External ear normal.     Left Ear: External ear normal.     Nose: Nose normal.     Mouth/Throat:     Mouth: Mucous membranes are moist.     Pharynx: Oropharynx is clear.  Eyes:     General: No scleral icterus.       Right eye: No discharge.        Left eye: No discharge.     Extraocular Movements: Extraocular movements intact.     Conjunctiva/sclera: Conjunctivae normal.     Pupils: Pupils are equal, round, and reactive to light.  Cardiovascular:     Rate and Rhythm: Normal rate and regular rhythm.     Pulses: Normal pulses.     Heart sounds: Normal heart sounds. No murmur heard.  No friction rub. No gallop.   Pulmonary:     Effort: Pulmonary effort is normal. No respiratory distress.  Breath sounds: Normal breath sounds. No stridor. No wheezing, rhonchi or rales.  Musculoskeletal:     Cervical back: Normal range of motion and neck supple.     Right lower leg: No edema.     Left lower leg: No edema.  Skin:    General: Skin is warm and dry.     Findings: No rash.  Neurological:     Mental Status: She is alert and oriented to person, place, and time.     Cranial Nerves: No cranial nerve deficit.     Motor: No weakness.     Coordination: Romberg sign negative. Coordination normal.     Gait: Gait normal.     Deep Tendon Reflexes: Reflexes normal.     Comments: Negative pronator drift.   Psychiatric:        Mood and Affect: Mood normal. Mood is not anxious or depressed.        Speech: Speech normal.        Behavior: Behavior normal. Behavior is not agitated.        Thought Content: Thought content normal.        Judgment: Judgment normal.      Assessment and Plan :   PDMP not reviewed this encounter.  1. Bad headache   2. Essential hypertension   3. Elevated blood pressure reading   4. COVID-19 virus infection     Suspect h/a related to uncontrolled HTN. She does not have any signs of stroke, acute intracranial process. Restart amlodipine. Schedule APAP, use Fioricet for breakthrough h/a, pain. Establish care with new PCP, f/u with headache clinic. Counseled patient on potential for adverse effects with medications prescribed/recommended today, ER and return-to-clinic precautions discussed, patient verbalized understanding.    Jaynee Eagles, PA-C 01/25/20 1739

## 2020-01-25 NOTE — Discharge Instructions (Addendum)
For your blood pressure please start taking 1/2 tablet daily amlodipine (5mg ) for the next week. Thereafter, take 1 tablet daily for a total of 10mg  amlodipine.   For diabetes or elevated blood sugar, please make sure you are avoiding starchy, carbohydrate foods like pasta, breads, pastry, rice, potatoes, desserts. These foods can elevated your blood sugar. Also, avoid sodas, sweet teas, sugary beverages, fruit juices.  Drinking plain water will be much more helpful, try 64 ounces of water daily.  It is okay to flavor your water naturally by cutting cucumber, lemon, mint or lime, placing it in a picture with water and drinking it over a period of 2 to 3 days as long as it remains refrigerated.    Please schedule Tylenol at 500 mg - 650 mg once every 6 hours as needed for headaches and pains.  If you still have pain despite taking Tylenol regularly, this is breakthrough pain.  You can use Fioricet once every 12 hours for this.  Once your pain is better controlled, switch back to just Tylenol. Do not use any nonsteroidal anti-inflammatories (NSAIDs) like ibuprofen, Motrin, naproxen, Aleve, etc. which are all available over-the-counter.    For elevated blood pressure, make sure you are monitoring salt in your diet.  Do not eat restaurant foods and limit processed foods at home, prepare/cook your own foods at home.  Processed foods include things like frozen meals preseasoned meats and dinners, deli meats, canned foods as they are high in sodium/salt.  Make sure your pain attention to sodium labels on foods you by at the grocery store.  For seasoning you can use a brand called Mrs. Dash which includes a lot of salt free seasonings.  Salads - kale, spinach, cabbage, spring mix; use seeds like pumpkin seeds or sunflower seeds, almonds, walnuts or pecans; you can also use 1-2 hard boiled eggs in your salads Fruits - avocadoes, berries (blueberries, raspberries, blackberries), apples, oranges, pomegranate, pear;  avoid eating bananas, grapes regularly Vegetables - aspargus, cauliflower, broccoli, green beans, brussel spouts, bell peppers; stay away from starchy vegetables like potatoes, carrots, peas  Regarding meat it is better to eat lean meats and limit your red meat including pork to once a week.  Wild caught fish, chicken breast are good options as they tend to be leaner sources of good protein.   DO NOT EAT ANY FOODS ON THIS LIST THAT YOU ARE ALLERGIC TO.

## 2020-01-26 ENCOUNTER — Emergency Department (HOSPITAL_COMMUNITY)
Admission: EM | Admit: 2020-01-26 | Discharge: 2020-01-27 | Disposition: A | Discharge status: Home / Self Care | Payer: Medicaid Other | Attending: Emergency Medicine | Admitting: Emergency Medicine

## 2020-01-26 ENCOUNTER — Encounter (HOSPITAL_COMMUNITY): Payer: Self-pay | Admitting: *Deleted

## 2020-01-26 DIAGNOSIS — R05 Cough: Secondary | ICD-10-CM | POA: Diagnosis not present

## 2020-01-26 DIAGNOSIS — I10 Essential (primary) hypertension: Secondary | ICD-10-CM | POA: Insufficient documentation

## 2020-01-26 DIAGNOSIS — U071 COVID-19: Secondary | ICD-10-CM | POA: Diagnosis not present

## 2020-01-26 DIAGNOSIS — G4489 Other headache syndrome: Secondary | ICD-10-CM | POA: Diagnosis not present

## 2020-01-26 DIAGNOSIS — Z87891 Personal history of nicotine dependence: Secondary | ICD-10-CM | POA: Diagnosis not present

## 2020-01-26 DIAGNOSIS — R519 Headache, unspecified: Secondary | ICD-10-CM | POA: Diagnosis not present

## 2020-01-26 DIAGNOSIS — Z79899 Other long term (current) drug therapy: Secondary | ICD-10-CM | POA: Diagnosis not present

## 2020-01-26 NOTE — ED Triage Notes (Signed)
Pt has had cough since Monday. Pt had aches on Tuesday.  She is having headache, almost a migraine.  Pt started having pain down the back of her neck.  She went to [facility] yesterday and had high blood pressure.  Pt is concerned because of the HTN hx and having COVID.  Pt has had her BP pill they started her on yesterday.  She took some tylenol as well.  Decreased PO intake.

## 2020-01-26 NOTE — ED Triage Notes (Signed)
EMS reports pt dx'd w/ Covid 5 days ago, went to [facility] for eval of headache, HTN. Reports has been taking round the clock dayquil. At this time- have not visualized pt, took report from EMS but pt now over pediatrics with her child. No vitals, no patient contact by this RN. All information rec'd from report by EMS.

## 2020-01-27 ENCOUNTER — Other Ambulatory Visit: Payer: Self-pay

## 2020-01-27 MED ORDER — AMLODIPINE BESYLATE 5 MG PO TABS
5.0000 mg | ORAL_TABLET | Freq: Once | ORAL | Status: AC
  Administered 2020-01-27: 5 mg via ORAL
  Filled 2020-01-27: qty 1

## 2020-01-27 MED ORDER — MELOXICAM 7.5 MG PO TABS: 7.5000 mg | ORAL_TABLET | Freq: Two times a day (BID) | ORAL | 0 refills | Status: AC | PRN

## 2020-01-27 MED ORDER — SUMATRIPTAN SUCCINATE 50 MG PO TABS: 50.0000 mg | ORAL_TABLET | ORAL | 0 refills | Status: DC | PRN

## 2020-01-27 MED ORDER — KETOROLAC TROMETHAMINE 60 MG/2ML IM SOLN
60.0000 mg | Freq: Once | INTRAMUSCULAR | Status: AC
  Administered 2020-01-27: 60 mg via INTRAMUSCULAR
  Filled 2020-01-27: qty 2

## 2020-01-27 NOTE — ED Provider Notes (Signed)
[facility] EMERGENCY DEPARTMENT Provider Note   CSN: [id number] Arrival date & time: 01/26/20  1959     History Chief Complaint  Patient presents with  . Covid +  . Headache    Brenda Colon is a 39 y.o. female.  HPI   39 y/o female - hx of hypertension Tested positive for Covid approximately 3 or 4 days ago, had both an at home test and a test at work.  Over the last week she is struggled with frontal headaches in fact she went to the urgent care a couple of days ago and was prescribed medications including Fioricet.  She was also restarted on her blood pressure medications which she has been out of for some time.  Over the last 2 days she has been compliant with amlodipine, also taking Goody powders, BC powders and the Fioricet but not having any relief of this rather constant frontal headache.  No nausea vomiting or diarrhea, no loss of sense of taste or smell, she is not really coughing or short of breath at all at this time.  She is not having any sore throat, no gastrointestinal symptoms.  She is complaining of ongoing headache, no stiff neck, no changes in vision.  She does have a history of migraines in the past.  Past Medical History:  Diagnosis Date  . Hypertension   . STD (sexually transmitted disease)    [place]  . Uterine fibroid     Patient Active Problem List   Diagnosis Date Noted  . Chronic hypertension affecting pregnancy 12/30/2017  . Family history of microcephaly 09/14/2017  . Fibroid 08/15/2017  . Chronic hypertension during pregnancy, antepartum 03/26/2017  . GERD with esophagitis 03/26/2017    Past Surgical History:  Procedure Laterality Date  . CYSTECTOMY     Wrist  . INDUCED ABORTION       OB History    Gravida  3   Para  1   Term  1   Preterm      AB  2   Living  1     SAB  1   TAB  1   Ectopic      Multiple  0   Live Births  1           Family History  Problem Relation Age of Onset  .  Diabetes Mother   . Diabetes Father   . Lupus Sister     Social History   Tobacco Use  . Smoking status: Former Research scientist (life sciences)  . Smokeless tobacco: Never Used  Vaping Use  . Vaping Use: Never used  Substance Use Topics  . Alcohol use: No  . Drug use: No    Home Medications Prior to Admission medications   Medication Sig Start Date End Date Taking? Authorizing Provider  amLODipine (NORVASC) 10 MG tablet Take 1 tablet (10 mg total) by mouth daily. 01/25/20   Jaynee Eagles, PA-C  butalbital-acetaminophen-caffeine (FIORICET) [phone] MG tablet Take 1 tablet by mouth 2 (two) times daily as needed for headache. 01/25/20 01/24/21  Jaynee Eagles, PA-C  FOLIC ACID PO Take by mouth.    [provider]  meloxicam (MOBIC) 7.5 MG tablet Take 1 tablet (7.5 mg total) by mouth 2 (two) times daily as needed for up to 14 days for pain. 01/27/20 02/10/20  Noemi Chapel, MD  Multiple Vitamin (MULTIVITAMIN) tablet Take 1 tablet by mouth daily.    [provider]  SUMAtriptan (IMITREX) 50 MG tablet Take  1 tablet (50 mg total) by mouth every 2 (two) hours as needed for migraine. May repeat in 2 hours if headache persists or recurs. 01/27/20   Noemi Chapel, MD  NIFEdipine (PROCARDIA-XL/ADALAT-CC/NIFEDICAL-XL) 30 MG 24 hr tablet Take by mouth once per day. 12/02/17 12/22/18  [name], MD    Allergies    Patient has no known allergies.  Review of Systems   Review of Systems  All other systems reviewed and are negative.   Physical Exam Updated Vital Signs BP (!) 151/122   Pulse 95   Temp 99 F (37.2 C) (Oral)   Resp 19   Ht 1.676 m (5\' 6" ) Comment: Simultaneous filing. User may not have seen previous data.  Wt 94 kg Comment: Simultaneous filing. User may not have seen previous data.  LMP 01/07/2020   SpO2 99%   BMI 33.45 kg/m   Physical Exam Vitals and nursing note reviewed.  Constitutional:      General: She is not in acute distress.    Appearance: She is well-developed.  HENT:       Head: Normocephalic and atraumatic.     Mouth/Throat:     Pharynx: No oropharyngeal exudate.  Eyes:     General: No scleral icterus.       Right eye: No discharge.        Left eye: No discharge.     Conjunctiva/sclera: Conjunctivae normal.     Pupils: Pupils are equal, round, and reactive to light.  Neck:     Thyroid: No thyromegaly.     Vascular: No JVD.  Cardiovascular:     Rate and Rhythm: Normal rate and regular rhythm.     Heart sounds: Normal heart sounds. No murmur heard.  No friction rub. No gallop.   Pulmonary:     Effort: Pulmonary effort is normal. No respiratory distress.     Breath sounds: Normal breath sounds. No wheezing or rales.  Abdominal:     General: Bowel sounds are normal. There is no distension.     Palpations: Abdomen is soft. There is no mass.     Tenderness: There is no abdominal tenderness.  Musculoskeletal:        General: No tenderness. Normal range of motion.     Cervical back: Normal range of motion and neck supple.  Lymphadenopathy:     Cervical: No cervical adenopathy.  Skin:    General: Skin is warm and dry.     Findings: No erythema or rash.  Neurological:     Mental Status: She is alert.     Coordination: Coordination normal.     Comments: The patient is able to follow commands with all 4 extremities with normal coordination and strength, cranial nerves III through XII are normal, speech is clear, no facial droop  Psychiatric:        Behavior: Behavior normal.     ED Results / Procedures / Treatments   Labs (all labs ordered are listed, but only abnormal results are displayed) Labs Reviewed - No data to display  EKG None  Radiology No results found.  Procedures Procedures (including critical care time)  Medications Ordered in ED Medications  ketorolac (TORADOL) injection 60 mg (has no administration in time range)  amLODipine (NORVASC) tablet 5 mg (has no administration in time range)    ED Course  I have reviewed  the triage vital signs and the nursing notes.  Pertinent labs & imaging results that were available during my care of the  patient were reviewed by me and considered in my medical decision making (see chart for details).    MDM Rules/Calculators/A&P                          The patient is hypertensive at 150/120, she has not had her morning medications that she has been in emergency department for quite some time.  She has a heart rate of 95 on my exam, she is afebrile and has clear lungs with an oxygen of 99% on room air.  She has known Covid which is giving her the headache, I doubt that this is anything more significant such as meningitis, encephalitis, brain aneurysm or other cause of headache.  She is well-appearing with normal neurologic exam and is excepted a intramuscular shot of Toradol.  Will discharge with the same as well as some Imitrex, she is aware of the need to take her blood pressure medications and did get the prescription filled 2 days ago.  Stable for discharge  Final Clinical Impression(s) / ED Diagnoses Final diagnoses:  Headache disorder  COVID-19    Rx / DC Orders ED Discharge Orders         Ordered    meloxicam (MOBIC) 7.5 MG tablet  2 times daily PRN        01/27/20 1130    SUMAtriptan (IMITREX) 50 MG tablet  Every 2 hours PRN        01/27/20 1130           Noemi Chapel, MD 01/27/20 1132

## 2020-01-27 NOTE — ED Notes (Signed)
Pt being verbally abusive towards staff, pt notified that Alissa(NT) and myself just came on shift and was going around checking vital signs. Pt disregards, talking to family on phone saying staff does not care about her care.

## 2020-01-27 NOTE — ED Notes (Signed)
Patient is resting comfortably. 

## 2020-01-27 NOTE — Discharge Instructions (Addendum)
Continue amlodipine for blood pressure Mobic for headache, imitrex if no improvement ER for worsening symptoms  The headache associated with Covid can sometimes last for a couple of weeks.  Please be aware that he should hydrate aggressively, get lots of rest and take the medications as above

## 2020-02-09 ENCOUNTER — Ambulatory Visit: Payer: Self-pay

## 2020-02-09 NOTE — Telephone Encounter (Signed)
Patient called stating that she has noticed her BP high. Yesterday BP 138/109.  She has had headaches that she has gone to ER for examination. She was give migraine medication. She has had positive COVID-19 test 8/17 21.  She states that she has come through isolation and now wants to take care of herself.  She has been taking Norvasc 10 mg daily for about 1 week. She denies chest pain and other neurologic symptoms. Per protocol patient was scheduled video visit. For Tuesday.  Care advice read to patient.  She verbalized understanding of all information.  Reason for Disposition . Systolic BP  >= 098 OR Diastolic >= 119  Answer Assessment - Initial Assessment Questions 1. BLOOD PRESSURE: "What is the blood pressure?" "Did you take at least two measurements 5 minutes apart?"     Yesterday 138/109 2. ONSET: "When did you take your blood pressure?"     3 weeks 3. HOW: "How did you obtain the blood pressure?" (e.g., visiting nurse, automatic home BP monitor)     Yesterday at work 4. HISTORY: "Do you have a history of high blood pressure?"    Last year noticed headache and bp issues 5. MEDICATIONS: "Are you taking any medications for blood pressure?" "Have you missed any doses recently?"     About 1 week norvasc 10 mg  Once per day 6. OTHER SYMPTOMS: "Do you have any symptoms?" (e.g., headache, chest pain, blurred vision, difficulty breathing, weakness)    Headaches, pressure in face 7. PREGNANCY: "Is there any chance you are pregnant?" "When was your last menstrual period?"    No last period last week lighter than normal  Protocols used: BLOOD PRESSURE - HIGH-A-AH

## 2020-02-13 ENCOUNTER — Telehealth: Payer: Medicaid Other | Admitting: Family

## 2020-02-28 ENCOUNTER — Ambulatory Visit: Payer: Medicaid Other | Attending: Family | Admitting: Family Medicine

## 2020-02-28 DIAGNOSIS — G8929 Other chronic pain: Secondary | ICD-10-CM

## 2020-02-28 DIAGNOSIS — I10 Essential (primary) hypertension: Secondary | ICD-10-CM

## 2020-02-28 DIAGNOSIS — G479 Sleep disorder, unspecified: Secondary | ICD-10-CM

## 2020-02-28 DIAGNOSIS — R350 Frequency of micturition: Secondary | ICD-10-CM

## 2020-02-28 DIAGNOSIS — R519 Headache, unspecified: Secondary | ICD-10-CM

## 2020-02-28 MED ORDER — AMLODIPINE BESYLATE 10 MG PO TABS: 10.0000 mg | ORAL_TABLET | Freq: Every day | ORAL | 1 refills | Status: DC

## 2020-02-28 MED ORDER — TIZANIDINE HCL 4 MG PO TABS: 4.0000 mg | ORAL_TABLET | Freq: Every day | ORAL | 1 refills | Status: DC

## 2020-02-28 NOTE — Progress Notes (Signed)
Virtual Visit via Video Note  I connected with Brenda Colon on 02/28/20 at  4:10 PM EDT by a video enabled telemedicine application and verified that I am speaking with the correct person using two identifiers.  Location: Patient: Brenda Colon: [facility] Office   I discussed the limitations of evaluation and management by telemedicine and the availability of in person appointments. The patient expressed understanding and agreed to proceed.  History of Present Illness:        39 yo female who reports that she has had recurrent issues with headaches, usually occurring on the left side of the forehead, front of the head. Headaches have decreased in intensity since she restarted her blood pressure medication. Home BP is now in the 120's/90's. She reports that she tends to awaken often throughout the night in order to urinate. She tends to notice onset of headache after she awakens due to the need to urinate. She denies any history of gestational diabetes. She did have some mild depression after the birth of her son in 2019 but does not currently feel as if she is having any anxiety or depression at this time. She urinates about 3-4 times during the day but more frequently at night. She denies any swelling in her legs during the day or night.     Observations/Objective: She appears to be in no acute distress  Assessment and Plan:   Follow Up Instructions: 1. Essential hypertension She reports improvement in blood pressure on current medication.  New prescription provided for amlodipine.  She has been asked to come into the office to have BMP to check renal function and electrolytes. - Basic Metabolic Panel; Future - amLODipine (NORVASC) 10 MG tablet; Take 1 tablet (10 mg total) by mouth daily.  Dispense: 90 tablet; Refill: 1  2. Chronic left-sided headaches; 3.  Difficulty sleeping she reports improvement in headache since her blood pressure has been better controlled.  She continues however  to have issues with headaches and difficulty sleeping.  She does not feel as if anxiety or depression are playing a role in her headaches or difficulty sleeping.  Patient will be prescribed tizanidine 4 mg at bedtime to help with headaches or muscle spasm.  Headache should further improve and hopefully resolve with improvement in sleep and continued control of blood pressure.- tiZANidine (ZANAFLEX) 4 MG tablet; Take 1 tablet (4 mg total) by mouth at bedtime. As needed for muscle spasm or headache  Dispense: 30 tablet; Refill: 1   4. Urinary frequency She feels as if she is having some issues with frequent urination.  She has been asked to see if this may be related to her level of fluid intake.  She has also been asked to come into the office to have basic metabolic panel to check renal function and glucose as well as urinalysis to rule out urinary tract infection as the cause of her frequent urination.  Lab orders have been placed. - Basic Metabolic Panel; Future - Urinalysis, Complete; Future    I discussed the assessment and treatment plan with the patient. The patient was provided an opportunity to ask questions and all were answered. The patient agreed with the plan and demonstrated an understanding of the instructions.   The patient was advised to call back or seek an in-person evaluation if the symptoms worsen or if the condition fails to improve as anticipated.  Return in about 4 weeks (around 03/27/2020) for Headache/hypertension; labs in the next 1 to 2 weeks.  I provided 12  minutes of non-face-to-face time during this encounter.   Antony Blackbird, MD

## 2020-03-01 ENCOUNTER — Encounter: Payer: Self-pay | Admitting: Family Medicine

## 2020-03-05 DIAGNOSIS — G43839 Menstrual migraine, intractable, without status migrainosus: Secondary | ICD-10-CM | POA: Diagnosis not present

## 2020-03-05 DIAGNOSIS — Z79899 Other long term (current) drug therapy: Secondary | ICD-10-CM | POA: Diagnosis not present

## 2020-03-05 DIAGNOSIS — G43719 Chronic migraine without aura, intractable, without status migrainosus: Secondary | ICD-10-CM | POA: Diagnosis not present

## 2020-03-05 DIAGNOSIS — Z049 Encounter for examination and observation for unspecified reason: Secondary | ICD-10-CM | POA: Diagnosis not present

## 2020-03-07 ENCOUNTER — Other Ambulatory Visit: Payer: Self-pay

## 2020-03-07 ENCOUNTER — Ambulatory Visit: Payer: Medicaid Other | Attending: Family Medicine

## 2020-03-07 DIAGNOSIS — R350 Frequency of micturition: Secondary | ICD-10-CM | POA: Diagnosis not present

## 2020-03-07 DIAGNOSIS — I10 Essential (primary) hypertension: Secondary | ICD-10-CM | POA: Diagnosis not present

## 2020-03-08 ENCOUNTER — Encounter: Payer: Self-pay | Admitting: Family Medicine

## 2020-03-08 LAB — URINALYSIS, COMPLETE
Bilirubin, UA: NEGATIVE
Glucose, UA: NEGATIVE
Ketones, UA: NEGATIVE
Leukocytes,UA: NEGATIVE
Nitrite, UA: NEGATIVE
RBC, UA: NEGATIVE
Urobilinogen, Ur: 0.2 mg/dL (ref 0.2–1.0)
pH, UA: 5.5 (ref 5.0–7.5)

## 2020-03-08 LAB — BASIC METABOLIC PANEL WITH GFR
BUN/Creatinine Ratio: 12 (ref 9–23)
BUN: 10 mg/dL (ref 6–24)
CO2: 25 mmol/L (ref 20–29)
Calcium: 9.2 mg/dL (ref 8.7–10.2)
Chloride: 102 mmol/L (ref 96–106)
Creatinine, Ser: 0.84 mg/dL (ref 0.57–1.00)
GFR calc Af Amer: 101 mL/min/1.73
GFR calc non Af Amer: 87 mL/min/1.73
Glucose: 91 mg/dL (ref 65–99)
Potassium: 4.4 mmol/L (ref 3.5–5.2)
Sodium: 141 mmol/L (ref 134–144)

## 2020-03-08 LAB — MICROSCOPIC EXAMINATION: Casts: NONE SEEN /LPF

## 2020-03-10 ENCOUNTER — Other Ambulatory Visit: Payer: Self-pay | Admitting: Family Medicine

## 2020-03-10 DIAGNOSIS — N3000 Acute cystitis without hematuria: Secondary | ICD-10-CM

## 2020-03-10 MED ORDER — SULFAMETHOXAZOLE-TRIMETHOPRIM 800-160 MG PO TABS: 1.0000 | ORAL_TABLET | Freq: Two times a day (BID) | ORAL | 0 refills | Status: AC

## 2020-04-10 ENCOUNTER — Encounter: Payer: Self-pay | Admitting: Family Medicine

## 2020-04-12 NOTE — Telephone Encounter (Signed)
Att to contact pt to schedule appt.no ans unable to lvm mailbox full

## 2020-07-10 ENCOUNTER — Ambulatory Visit (INDEPENDENT_AMBULATORY_CARE_PROVIDER_SITE_OTHER): Payer: Medicaid Other | Admitting: *Deleted

## 2020-07-10 ENCOUNTER — Other Ambulatory Visit: Payer: Self-pay

## 2020-07-10 DIAGNOSIS — Z3201 Encounter for pregnancy test, result positive: Secondary | ICD-10-CM | POA: Diagnosis not present

## 2020-07-10 DIAGNOSIS — Z3200 Encounter for pregnancy test, result unknown: Secondary | ICD-10-CM

## 2020-07-10 DIAGNOSIS — I10 Essential (primary) hypertension: Secondary | ICD-10-CM | POA: Diagnosis not present

## 2020-07-10 LAB — POCT PREGNANCY, URINE: Preg Test, Ur: POSITIVE — AB

## 2020-07-10 NOTE — Progress Notes (Signed)
Left urine for pregnancy test.  Test was postiive. I called her and informed her test was postive. LMP was week of Christmas about 05/28/20 and periods are regular. This gives her EDD of 03/04/21 and is 6 weeks 1 day pregnant.  We discussed she should start prenatal care asap. She would like to go to our office as she has in the past. She has Hypertension. She will call registrars to discuss possibility of having prenatal care here since she is high risk. I encouraged her to start prenatal vitamins. We reviewed her meds and she is on norvasc. Reviewed with pharmacy and that is ok in pregnancy since she is already on it. Brenda Stenglein,RN

## 2020-07-10 NOTE — Patient Instructions (Signed)
[facility] for [place] locations:  Hours may vary. Please call for an appointment  [facility] for [facility] @ [place] [facility]  [place] [phone]  [facility] for [facility] @ [place]  [phone]  [place] @ [facility]       [address] [phone]            [facility] [place] @ [place]     [phone]          [facility] for [place] @ [place] [address] [phone]   [facility] [place] for [facility] @ [place] [facility]  [place] [phone]  Center for [facility] @ [place]  [phone]  [place] [facility]       [address] [phone]            [facility] for [place] @ [place]     [phone]          [facility] for [place] @ [facility]   [place] [address] [phone]  Center for [place] @ [place] [phone]     Center for [place] @ [address] Linna Hoff)  [place]   [phone]     [place] [facility]  Phone: [facility]  Phone: [facility] Phone: [phone]  [facility] Phone: [phone]  [facility] Phone: [phone]  [place] Phone: [phone]  [facility]  Phone: [phone]  Center for [place] @ [place] [phone]     [facility] for [place] @ [place]  [place]   [phone]

## 2020-07-10 NOTE — Progress Notes (Signed)
Patient was assessed and managed by nursing staff during this encounter. I have reviewed the chart and agree with the documentation and plan. I have also made any necessary editorial changes.  Verita Schneiders, MD 07/10/2020 1:10 PM

## 2020-08-21 ENCOUNTER — Ambulatory Visit (INDEPENDENT_AMBULATORY_CARE_PROVIDER_SITE_OTHER): Payer: Medicaid Other | Admitting: Family Medicine

## 2020-08-21 ENCOUNTER — Encounter: Payer: Self-pay | Admitting: Family Medicine

## 2020-08-21 ENCOUNTER — Other Ambulatory Visit: Payer: Self-pay

## 2020-08-21 DIAGNOSIS — Z3490 Encounter for supervision of normal pregnancy, unspecified, unspecified trimester: Secondary | ICD-10-CM | POA: Insufficient documentation

## 2020-08-21 DIAGNOSIS — O10919 Unspecified pre-existing hypertension complicating pregnancy, unspecified trimester: Secondary | ICD-10-CM

## 2020-08-21 DIAGNOSIS — O3680X0 Pregnancy with inconclusive fetal viability, not applicable or unspecified: Secondary | ICD-10-CM | POA: Insufficient documentation

## 2020-08-21 DIAGNOSIS — Z3A12 12 weeks gestation of pregnancy: Secondary | ICD-10-CM

## 2020-08-21 LAB — POCT URINALYSIS DIP (DEVICE)
Glucose, UA: NEGATIVE mg/dL
Ketones, ur: NEGATIVE mg/dL
Nitrite: NEGATIVE
Protein, ur: 100 mg/dL — AB
Specific Gravity, Urine: >=1.03 (ref 1.005–1.030)
Urobilinogen, UA: 0.2 mg/dL (ref 0.0–1.0)
pH: 6.5 (ref 5.0–8.0)

## 2020-08-21 NOTE — Progress Notes (Signed)
Informal bedside US performed for FHR. Large IUGS visualized, yolk sac was not seen. Hyperechoic structure visualized in lower portion of IUGS - cardiac activity was not observed. Dr. Dione Plover informed of results.

## 2020-08-21 NOTE — Progress Notes (Signed)
Subjective:   Brenda Colon is a 40 y.o. [id number] at [redacted]w[redacted]d by LMP being seen today for her first obstetrical visit.  Her obstetrical history is significant for advanced maternal age and 22. Pregnancy history fully reviewed.  Patient reports no complaints.  HISTORY: OB History  Gravida Para Term Preterm AB Living  4 1 1  0 2 1  SAB IAB Ectopic Multiple Live Births  1 1 0 0 1    # Outcome Date GA Lbr Len/2nd Weight Sex Delivery Anes PTL Lv  4 Current           3 Term 01/01/18 [redacted]w[redacted]d 04:07 / 01:39 7 lb 1.8 oz (3.225 kg) M Vag-Vacuum EPI  LIV     Name: Dorena Dew [facility]     Apgar1: 9  Apgar5: 9  2 SAB 2008          1 IAB            Last pap smear was  2018 and was normal Past Medical History:  Diagnosis Date  . Hypertension   . STD (sexually transmitted disease)    [place]  . Uterine fibroid    Past Surgical History:  Procedure Laterality Date  . CYSTECTOMY     Wrist  . INDUCED ABORTION     Family History  Problem Relation Age of Onset  . Diabetes Mother   . Diabetes Father   . Lupus Sister    Social History   Tobacco Use  . Smoking status: Former Research scientist (life sciences)  . Smokeless tobacco: Never Used  Vaping Use  . Vaping Use: Never used  Substance Use Topics  . Alcohol use: No  . Drug use: No   No Known Allergies Current Outpatient Medications on File Prior to Visit  Medication Sig Dispense Refill  . amLODipine (NORVASC) 10 MG tablet Take 1 tablet (10 mg total) by mouth daily. 90 tablet 1  . FOLIC ACID PO Take by mouth.    . Melatonin 10 MG CAPS Take 1 capsule by mouth daily.    . Multiple Vitamin (MULTIVITAMIN) tablet Take 1 tablet by mouth daily. (Patient not taking: Reported on 07/10/2020)    . [DISCONTINUED] NIFEdipine (PROCARDIA-XL/ADALAT-CC/NIFEDICAL-XL) 30 MG 24 hr tablet Take by mouth once per day. 30 tablet 2   No current facility-administered medications on file prior to visit.     Exam   There were no vitals filed for this visit.     Uterus:     Pelvic Exam: Perineum: no hemorrhoids, normal perineum   Vulva: normal external genitalia, no lesions   Vagina:  normal mucosa, normal discharge   Cervix: no lesions and normal, pap smear done.    Adnexa: normal adnexa and no mass, fullness, tenderness   Bony Pelvis: average  System: General: well-developed, well-nourished female in no acute distress   Breast:  normal appearance, no masses or tenderness   Skin: normal coloration and turgor, no rashes   Neurologic: oriented, normal, negative, normal mood   Extremities: normal strength, tone, and muscle mass, ROM of all joints is normal   HEENT PERRLA, extraocular movement intact and sclera clear, anicteric   Mouth/Teeth mucous membranes moist, pharynx normal without lesions and dental hygiene good   Neck supple and no masses   Cardiovascular: regular rate and rhythm   Respiratory:  no respiratory distress, normal breath sounds   Abdomen: soft, non-tender; bowel sounds normal; no masses,  no organomegaly     Assessment:   Pregnancy: [id number] Patient  Active Problem List   Diagnosis Date Noted  . Intrauterine pregnancy 08/21/2020  . Chronic hypertension affecting pregnancy 12/30/2017  . Family history of microcephaly 09/14/2017  . Fibroid 08/15/2017  . Chronic hypertension during pregnancy, antepartum 03/26/2017  . GERD with esophagitis 03/26/2017     Plan:  1. Pregnancy with uncertain fetal viability, single or unspecified fetus Patient here for new OB but on bedside US with handheld and subsequently with [place] in antenatal testing room able to visualize IUP with large gestational sac but no clear fetus or cardiac activity Discussed with patient this is highly suspicious for failed pregnancy given size of gestational sac, though possible she is just early and cardiac activity will develop shortly Remainder of new OB visit curtailed and labs not obtained, ordered for TVUS viability scan for next week - US OB LESS THAN 14  WEEKS WITH OB TRANSVAGINAL; Future  2. Chronic hypertension during pregnancy, antepartum On amlodipine 10mg    Return in 1 week (on 08/28/2020) for ultrasound.

## 2020-08-21 NOTE — Progress Notes (Signed)
U/S scheduled for viability March 24th @ 1000.  Pt notified.    Mel Almond, RN  08/21/20

## 2020-08-23 ENCOUNTER — Other Ambulatory Visit: Payer: Self-pay

## 2020-08-23 ENCOUNTER — Inpatient Hospital Stay (HOSPITAL_COMMUNITY): Payer: Medicaid Other

## 2020-08-23 ENCOUNTER — Inpatient Hospital Stay (HOSPITAL_COMMUNITY)
Admit: 2020-08-23 | Discharge: 2020-08-23 | Disposition: A | Discharge status: Home / Self Care | Payer: Medicaid Other | Attending: Obstetrics and Gynecology | Admitting: Obstetrics and Gynecology

## 2020-08-23 ENCOUNTER — Telehealth: Payer: Self-pay | Admitting: General Practice

## 2020-08-23 ENCOUNTER — Encounter (HOSPITAL_COMMUNITY): Payer: Self-pay | Admitting: Obstetrics and Gynecology

## 2020-08-23 DIAGNOSIS — Z79899 Other long term (current) drug therapy: Secondary | ICD-10-CM | POA: Insufficient documentation

## 2020-08-23 DIAGNOSIS — N838 Other noninflammatory disorders of ovary, fallopian tube and broad ligament: Secondary | ICD-10-CM | POA: Diagnosis not present

## 2020-08-23 DIAGNOSIS — Z87891 Personal history of nicotine dependence: Secondary | ICD-10-CM | POA: Diagnosis not present

## 2020-08-23 DIAGNOSIS — N939 Abnormal uterine and vaginal bleeding, unspecified: Secondary | ICD-10-CM

## 2020-08-23 DIAGNOSIS — Z3A09 9 weeks gestation of pregnancy: Secondary | ICD-10-CM | POA: Diagnosis not present

## 2020-08-23 DIAGNOSIS — O021 Missed abortion: Secondary | ICD-10-CM | POA: Diagnosis not present

## 2020-08-23 DIAGNOSIS — O209 Hemorrhage in early pregnancy, unspecified: Secondary | ICD-10-CM | POA: Diagnosis present

## 2020-08-23 DIAGNOSIS — Z3A12 12 weeks gestation of pregnancy: Secondary | ICD-10-CM | POA: Insufficient documentation

## 2020-08-23 DIAGNOSIS — O26851 Spotting complicating pregnancy, first trimester: Secondary | ICD-10-CM | POA: Diagnosis not present

## 2020-08-23 LAB — URINALYSIS, ROUTINE W REFLEX MICROSCOPIC
Glucose, UA: NEGATIVE mg/dL
Ketones, ur: NEGATIVE mg/dL
Leukocytes,Ua: NEGATIVE
Nitrite: NEGATIVE
Protein, ur: 30 mg/dL — AB
Specific Gravity, Urine: >1.03 — ABNORMAL HIGH (ref 1.005–1.030)
pH: 6 (ref 5.0–8.0)

## 2020-08-23 LAB — CBC
HCT: 40.4 % (ref 36.0–46.0)
Hemoglobin: 12.8 g/dL (ref 12.0–15.0)
MCH: 25.1 pg — ABNORMAL LOW (ref 26.0–34.0)
MCHC: 31.7 g/dL (ref 30.0–36.0)
MCV: 79.2 fL — ABNORMAL LOW (ref 80.0–100.0)
Platelets: 304 10*3/uL (ref 150–400)
RBC: 5.1 MIL/uL (ref 3.87–5.11)
RDW: 16.1 % — ABNORMAL HIGH (ref 11.5–15.5)
WBC: 10 10*3/uL (ref 4.0–10.5)
nRBC: 0 % (ref 0.0–0.2)

## 2020-08-23 LAB — WET PREP, GENITAL
Clue Cells Wet Prep HPF POC: NONE SEEN
Sperm: NONE SEEN
Trich, Wet Prep: NONE SEEN
Yeast Wet Prep HPF POC: NONE SEEN

## 2020-08-23 LAB — URINALYSIS, MICROSCOPIC (REFLEX)

## 2020-08-23 LAB — HCG, QUANTITATIVE, PREGNANCY: hCG, Beta Chain, Quant, S: 5277 m[IU]/mL — ABNORMAL HIGH (ref <5)

## 2020-08-23 MED ORDER — IBUPROFEN 600 MG PO TABS: 600.0000 mg | ORAL_TABLET | Freq: Four times a day (QID) | ORAL | 0 refills | Status: AC | PRN

## 2020-08-23 MED ORDER — MISOPROSTOL 200 MCG PO TABS: ORAL_TABLET | ORAL | 1 refills | Status: DC

## 2020-08-23 MED ORDER — PROMETHAZINE HCL 12.5 MG PO TABS: 12.5000 mg | ORAL_TABLET | Freq: Four times a day (QID) | ORAL | 0 refills | Status: DC | PRN

## 2020-08-23 MED ORDER — HYDROCODONE-ACETAMINOPHEN 5-325 MG PO TABS: 2.0000 | ORAL_TABLET | ORAL | 0 refills | Status: AC | PRN

## 2020-08-23 NOTE — MAU Note (Addendum)
...  Brenda Colon is a 41 y.o. at [redacted]w[redacted]d here in MAU reporting: Early Wednesday morning she had brown blood only when wiping. She states yesterday she noticed the brown spotting had turned light pink/reddish, still only when wiping. She reports early this morning she began experiencing abdominal discomfort so she took Tylenol (approximately 500 mg - she cannot remember) at 0630. She states around an hour later she used the restroom and the bleeding seemed to have increased to a deeper red color and more in amount but still only when wiping. She states the Tylenol has not helped her pain.   Her first OB appointment was yesterday and they informed her that the blood was old blood. She reports they attempted to doppler the FHR and were unable to so they scheduled her for an U/S this upcoming Thursday, March 24th.  Has Chronic HTN and has 10 mg of Amlodipine prescribed but is not taking.  BP: 140/102  P: 89 R: 16 T: 98.4 O2: 97%   Pain score: 6/10 lower abdominal  Lab orders placed from triage:  UA

## 2020-08-23 NOTE — Telephone Encounter (Signed)
Patient called into MFM and requested appt with [place] ASAP. Called patient and she states she is wanting to be seen because she started to bleed yesterday. Patient states bleeding is increasing today though not like a period yet. She also reports feeling menstrual cramps rated at a 6 with some pressure in her bottom. Advised patient go to MAU for evaluation & provided directions. Patient verbalized understanding. MAU called & notified.

## 2020-08-23 NOTE — MAU Provider Note (Signed)
History     CSN: [id number]  Arrival date and time: 08/23/20 0849   Event Date/Time   First Provider Initiated Contact with Patient 08/23/20 1110      Chief Complaint  Patient presents with  . Vaginal Bleeding  . Abdominal Pain   HPI Brenda Colon is a 40 y.o. [id number] at [redacted]w[redacted]d by LMP who presents to MAU with chief complaint of worsening vaginal bleeding and abdominal cramping. These are existing problems which intensified last night and early this morning. She denies saturating a pad. Pain is supapubic, does not radiate.  Pain score is 6/10. She is remote from sexual intercourse. She denies dysuria, abdominal tenderness, fever.  Patient has initiated care with [facility].  OB History    Gravida  4   Para  1   Term  1   Preterm      AB  2   Living  1     SAB  1   IAB  1   Ectopic      Multiple  0   Live Births  1           Past Medical History:  Diagnosis Date  . Hypertension   . STD (sexually transmitted disease)    [place]  . Uterine fibroid     Past Surgical History:  Procedure Laterality Date  . CYSTECTOMY     Wrist  . INDUCED ABORTION      Family History  Problem Relation Age of Onset  . Diabetes Mother   . Diabetes Father   . Lupus Sister     Social History   Tobacco Use  . Smoking status: Former Research scientist (life sciences)  . Smokeless tobacco: Never Used  Vaping Use  . Vaping Use: Never used  Substance Use Topics  . Alcohol use: No  . Drug use: No    Allergies: No Known Allergies  Medications Prior to Admission  Medication Sig Dispense Refill Last Dose  . acetaminophen (TYLENOL) 500 MG tablet Take 500 mg by mouth every 6 (six) hours as needed.   08/23/2020 at 0630  . amLODipine (NORVASC) 10 MG tablet Take 1 tablet (10 mg total) by mouth daily. 90 tablet 1 08/20/2020 at Unknown time  . Melatonin 10 MG CAPS Take 1 capsule by mouth daily.   Past Month at Unknown time  . Multiple Vitamin (MULTIVITAMIN) tablet Take 1 tablet by mouth daily.    Past Month at Unknown time  . FOLIC ACID PO Take by mouth.       Review of Systems  Gastrointestinal: Positive for abdominal pain.  Genitourinary: Positive for vaginal bleeding.  All other systems reviewed and are negative.  Physical Exam   Blood pressure (!) 130/93, pulse 81, temperature 98.4 F (36.9 C), temperature source Oral, weight 116.9 kg, last menstrual period 05/28/2020, SpO2 97 %.  Physical Exam Vitals and nursing note reviewed. Exam conducted with a chaperone present.  Constitutional:      Appearance: She is well-developed.  Cardiovascular:     Rate and Rhythm: Normal rate.     Heart sounds: Normal heart sounds.  Pulmonary:     Effort: Pulmonary effort is normal.     Breath sounds: Normal breath sounds.  Abdominal:     Palpations: Abdomen is soft.     Tenderness: There is no abdominal tenderness.  Skin:    General: Skin is warm and dry.     Capillary Refill: Capillary refill takes less than 2 seconds.  Neurological:  Mental Status: She is alert and oriented to person, place, and time.  Psychiatric:        Mood and Affect: Mood normal.        Behavior: Behavior normal.     MAU Course  Procedures   --Confirmed with Dr. Rosana Hoes patient is appropriate for trial of Cytotec as initial intervention. Pt verbalizes understanding Cytotec may not be successful after which she will need operative intervention  --Pt in office for New OB 03/16. Bedside US performed by Dr. Dione Plover. Note reflects concern for absence of clearly identifiable cardiac activity. Pt originally scheduled for viability scan 08/29/2020.  Orders Placed This Encounter  Procedures  . Wet prep, genital    Standing Status:   Standing    Number of Occurrences:   1  . US OB Transvaginal    Bedside ultrasound this week, saw GS but not fetal pole. Now heavier bleeding and cramping    Standing Status:   Standing    Number of Occurrences:   1    Order Specific Question:   Symptom/Reason for Exam     Answer:   Episode of heavy vaginal bleeding [[id number]]  . CBC    Standing Status:   Standing    Number of Occurrences:   1  . hCG, quantitative, pregnancy    Standing Status:   Standing    Number of Occurrences:   1  . Urinalysis, Routine w reflex microscopic Urine, Clean Catch    Standing Status:   Standing    Number of Occurrences:   1  . Urinalysis, Microscopic (reflex)    Standing Status:   Standing    Number of Occurrences:   1  . Discharge patient    Order Specific Question:   Discharge disposition    Answer:   01-Home or Self Care [1]    Order Specific Question:   Discharge patient date    Answer:   08/23/2020   Results for orders placed or performed during the hospital encounter of 08/23/20 (from the past 24 hour(s))  Urinalysis, Routine w reflex microscopic Urine, Clean Catch     Status: Abnormal   Collection Time: 08/23/20  9:17 AM  Result Value Ref Range   Color, Urine YELLOW YELLOW   APPearance HAZY (A) CLEAR   Specific Gravity, Urine >1.030 (H) 1.005 - 1.030   pH 6.0 5.0 - 8.0   Glucose, UA NEGATIVE NEGATIVE mg/dL   Hgb urine dipstick LARGE (A) NEGATIVE   Bilirubin Urine SMALL (A) NEGATIVE   Ketones, ur NEGATIVE NEGATIVE mg/dL   Protein, ur 30 (A) NEGATIVE mg/dL   Nitrite NEGATIVE NEGATIVE   Leukocytes,Ua NEGATIVE NEGATIVE  Urinalysis, Microscopic (reflex)     Status: Abnormal   Collection Time: 08/23/20  9:17 AM  Result Value Ref Range   RBC / HPF 21-50 0 - 5 RBC/hpf   WBC, UA 0-5 0 - 5 WBC/hpf   Bacteria, UA FEW (A) NONE SEEN   Squamous Epithelial / LPF 0-5 0 - 5   Mucus PRESENT   CBC     Status: Abnormal   Collection Time: 08/23/20  9:21 AM  Result Value Ref Range   WBC 10.0 4.0 - 10.5 K/uL   RBC 5.10 3.87 - 5.11 MIL/uL   Hemoglobin 12.8 12.0 - 15.0 g/dL   HCT 40.4 36.0 - 46.0 %   MCV 79.2 (L) 80.0 - 100.0 fL   MCH 25.1 (L) 26.0 - 34.0 pg   MCHC 31.7 30.0 - 36.0 g/dL  RDW 16.1 (H) 11.5 - 15.5 %   Platelets 304 150 - 400 K/uL   nRBC 0.0 0.0 - 0.2 %   hCG, quantitative, pregnancy     Status: Abnormal   Collection Time: 08/23/20  9:21 AM  Result Value Ref Range   hCG, Beta Chain, Quant, S 5,277 (H) <5 mIU/mL  Wet prep, genital     Status: Abnormal   Collection Time: 08/23/20  9:50 AM   Specimen: Vaginal  Result Value Ref Range   Yeast Wet Prep HPF POC NONE SEEN NONE SEEN   Trich, Wet Prep NONE SEEN NONE SEEN   Clue Cells Wet Prep HPF POC NONE SEEN NONE SEEN   WBC, Wet Prep HPF POC MANY (A) NONE SEEN   Sperm NONE SEEN    US OB Transvaginal  Result Date: 08/23/2020 CLINICAL DATA:  40 year old pregnant female presents with episode of spotting yesterday. Cramping today. EDC by LMP: 03/04/2021, projecting to an expected gestational age of [redacted] weeks 3 days. EXAM: TRANSVAGINAL OB ULTRASOUND TECHNIQUE: Transvaginal ultrasound was performed for complete evaluation of the gestation as well as the maternal uterus, adnexal regions, and pelvic cul-de-sac. COMPARISON:  No prior scans from this gestation. FINDINGS: Intrauterine gestational sac: Single Yolk sac:  Not Visualized. Fetus: Visualized. Fetal Cardiac Activity: Not Visualized on M-mode, color Doppler or grayscale cine. CRL:   27.8 mm   9 w 4 d                  [place] EDC: 03/24/2021 Subchorionic hemorrhage:  None visualized. Maternal uterus/adnexae: Right ovary measures 3.6 x 1.9 x 2.1 cm and contains a small corpus luteum. Left ovary measures 1.6 x 1.6 x 1.8 cm. No abnormal ovarian or adnexal masses. No abnormal free fluid in the pelvis. No uterine fibroids demonstrated. IMPRESSION: 1. Single intrauterine gestation at 9 weeks 4 days by crown-rump length, discordant with expected gestational age of [redacted] weeks 3 days by provided menstrual dating. No fetal cardiac activity detected. Findings are diagnostic of intrauterine fetal demise. 2. No ovarian or adnexal abnormality. Electronically Signed   By: Ilona Sorrel M.D.   On: 08/23/2020 10:42   Early Intrauterine Pregnancy Failure  ___  Documented  intrauterine pregnancy failure less than or equal to [redacted] weeks gestation  ___  No serious current illness  ___  Baseline Hgb greater than or equal to 10g/dl  ___  Patient has easily accessible transportation to the hospital  ___  Clear preference  ___  Practitioner/physician deems patient reliable  ___  Counseling by practitioner or physician  ___  Patient education by RN  ___  Medication dispensed:   ___  Cytotec 800 mcg (Intravaginally by patient at home)          ___  Ibuprofen 600 mg 1 tablet by mouth every 6 hours as needed #30   ___  Hydrocodone/acetaminophen 5/325 mg by mouth every 4 to 6 hours as needed   ___  Phenergan 12.5 mg by mouth every 4 hours as needed for nausea    Meds ordered this encounter  Medications  . misoprostol (CYTOTEC) 200 MCG tablet    Sig: insert four tablets vaginally    Dispense:  4 tablet    Refill:  1    Order Specific Question:   Supervising Provider    Answer:   Sloan Leiter [[id number]]  . promethazine (PHENERGAN) 12.5 MG tablet    Sig: Take 1 tablet (12.5 mg total) by mouth every 6 (six) hours as  needed for nausea or vomiting.    Dispense:  30 tablet    Refill:  0    Order Specific Question:   Supervising Provider    Answer:   Sloan Leiter [[id number]]  . HYDROcodone-acetaminophen (NORCO/VICODIN) 5-325 MG tablet    Sig: Take 2 tablets by mouth every 4 (four) hours as needed for up to 3 days.    Dispense:  10 tablet    Refill:  0    Order Specific Question:   Supervising Provider    Answer:   Sloan Leiter [[id number]]  . ibuprofen (ADVIL) 600 MG tablet    Sig: Take 1 tablet (600 mg total) by mouth every 6 (six) hours as needed.    Dispense:  120 tablet    Refill:  0    Order Specific Question:   Supervising Provider    Answer:   Sloan Leiter [[id number]]   Assessment and Plan  -40 y.o. [id number] at [redacted]w[redacted]d by LMP --Fetal demise --Cytotec per protocol at home per patient preference --Blood type O POS --Discharge home in stable  condition with bleeding precautions  F/U: --Repeat Quant hCG in one week  Darlina Rumpf, CNM 08/23/2020, 3:16 PM

## 2020-08-23 NOTE — Discharge Instructions (Signed)
Miscarriage A miscarriage is the loss of a pregnancy before the 20th week of pregnancy. Sometimes, a pregnancy ends before a woman knows that she is pregnant. If you lose a pregnancy, talk with your doctor about:  Questions you have about the loss of your baby.  How to work through your grief.  Plans for future pregnancy. What are the causes? Many times, the cause of this condition is not known. What increases the risk? These things may make a pregnant woman more likely to lose a pregnancy: Certain health conditions  Conditions that affect hormones, such as: ? Thyroid disease. ? Polycystic ovary syndrome.  Diabetes.  A disease that causes the body's disease-fighting system to attack itself by mistake.  Infections.  Bleeding problems.  Being very overweight. Lifestyle factors  Using products that have tobacco or nicotine in them.  Being around tobacco smoke.  Having alcohol.  Having a lot of caffeine.  Using drugs. Problems with reproductive organs or parts  Having a cervix that opens and thins before your due date. The cervix is the lowest part of your womb.  Having Asherman syndrome, which leads to: ? Scars in the womb. ? The womb being abnormal in shape.  Growths (fibroids) in the womb.  Problems in the body that are present at birth.  Infection of the cervix or womb. Personal or health history  Injury.  Having lost a pregnancy before.  Being younger than age 18 or older than age 35.  Being around a harmful substance, such as radiation.  Having lead or other heavy metals in: ? Things you eat or drink. ? The air around you.  Using certain medicines. What are the signs or symptoms?  Blood or spots of blood coming from the vagina. You may also have cramps or pain.  Pain or cramps in the belly or low back.  Fluid or tissue coming out of the vagina. How is this treated? Sometimes, treatment is not needed. If you need treatment, you may be  treated with:  A procedure to open the cervix more and take tissue out of the womb.  Medicines. You may get a shot of medicine called Rho(D) immune globulin. Follow these instructions at home: Medicines  Take over-the-counter and prescription medicines only as told by your doctor.  If you were prescribed antibiotic medicine, take it as told by your doctor. Do not stop taking it even if you start to feel better. Activity  Rest as told by your doctor. Ask your doctor what activities are safe for you.  Have someone help you at home during this time. General instructions  Watch how much tissue comes out of the vagina.  Watch the size of any blood clots that come out of the vagina.  Do not have sex or douche until your doctor says it is okay.  Do not put things, such as tampons, in your vagina until your doctor says it is okay.  To help you and your partner with grieving: ? Talk with your doctor. ? See a counselor.  When you are ready, talk with your doctor about: ? Things to do for your health. ? How you can be healthy if you get pregnant again.  Keep all follow-up visits.   Where to find more information  The American College of Obstetricians and Gynecologists: acog.org  U.S[place] Department of Health and Human Services Office of Women's Health: hrsa.gov/office-womens-health Contact a doctor if:  You have a fever or chills.  There is bad-smelling fluid coming   from the vagina.  You have more bleeding.  Tissue or clots of blood come out of your vagina. Get help right away if:  You have very bad cramps or pain in your back or belly.  You soak more than 2 large pads in an hour for more than 2 hours.  You get light-headed or weak.  You faint.  You feel sad, and you have sad thoughts a lot of the time.  You think about hurting yourself. Get help right awayif you feel like you may hurt yourself or others, or have thoughts about taking your own life. Go to your nearest  emergency room or:  Call your local emergency services (911 in the [place]  Call the National Suicide Prevention Lifeline at 1-800-273-8255. This is open 24 hours a day.  Text the Crisis Text Line at 741741. Summary  A miscarriage is the loss of a pregnancy before the 20th week of pregnancy. Sometimes, a pregnancy ends before a woman knows that she is pregnant.  Follow instructions from your doctor about medicines and activity.  To help you and your partner with grieving, talk with your doctor or a counselor.  Keep all follow-up visits. This information is not intended to replace advice given to you by your health care provider. Make sure you discuss any questions you have with your health care provider. Document Revised: 11/24/2019 Document Reviewed: 11/24/2019 Elsevier Patient Education  2021 Elsevier Inc.  

## 2020-08-26 LAB — GC/CHLAMYDIA PROBE AMP (~~LOC~~) NOT AT ARMC
Chlamydia: NEGATIVE
Comment: NEGATIVE
Comment: NORMAL
Neisseria Gonorrhea: NEGATIVE

## 2020-08-29 ENCOUNTER — Other Ambulatory Visit: Payer: Self-pay

## 2020-08-29 ENCOUNTER — Encounter: Payer: Self-pay | Admitting: Obstetrics and Gynecology

## 2020-08-29 ENCOUNTER — Ambulatory Visit
Admission: RE | Admit: 2020-08-29 | Discharge: 2020-08-29 | Disposition: A | Discharge status: Home / Self Care | Payer: Medicaid Other | Source: Ambulatory Visit | Attending: Family Medicine | Admitting: Family Medicine

## 2020-08-29 ENCOUNTER — Ambulatory Visit (INDEPENDENT_AMBULATORY_CARE_PROVIDER_SITE_OTHER): Payer: Medicaid Other | Admitting: Obstetrics and Gynecology

## 2020-08-29 VITALS — BP 126/95 | HR 76 | Wt 252.0 lb

## 2020-08-29 DIAGNOSIS — O039 Complete or unspecified spontaneous abortion without complication: Secondary | ICD-10-CM

## 2020-08-29 DIAGNOSIS — O3680X0 Pregnancy with inconclusive fetal viability, not applicable or unspecified: Secondary | ICD-10-CM

## 2020-08-29 NOTE — Progress Notes (Signed)
Brenda Colon presents for f/u SAB. See previous notes. Seen in MAU on 08/23/20 and Dx with MAB, given Cytotec but reports did not take. Had heavy bleeding and passed tissue later that day. Bleeding has decreased. No bowel or bladder dysfunction  PE AF VSS Lungs clear Heart RRR Abd soft +BS  A/P S/P SAB Discussed with pt. Will check BHCG and follow as indicated till < 5.

## 2020-08-30 LAB — BETA HCG QUANT (REF LAB): hCG Quant: 595 m[IU]/mL

## 2020-09-02 ENCOUNTER — Telehealth: Payer: Self-pay | Admitting: *Deleted

## 2020-09-02 DIAGNOSIS — O039 Complete or unspecified spontaneous abortion without complication: Secondary | ICD-10-CM

## 2020-09-02 NOTE — Telephone Encounter (Addendum)
-----   Message from [name], MD sent at 08/31/2020  9:27 AM EDT ----- Please schedule Ms Brenda Colon for repeat BHCG, non stat, in 2 weeks. Pt is aware. Thanks Legrand Como   3/28  0920  Pt was called and notified of results and plan of care. She will be notified of appt date and time via Mychart. Pt voiced understanding and agreed.

## 2020-09-17 ENCOUNTER — Other Ambulatory Visit: Payer: Self-pay

## 2020-09-17 ENCOUNTER — Other Ambulatory Visit: Payer: Medicaid Other

## 2020-09-17 DIAGNOSIS — O039 Complete or unspecified spontaneous abortion without complication: Secondary | ICD-10-CM

## 2020-09-18 LAB — BETA HCG QUANT (REF LAB): hCG Quant: 6 m[IU]/mL

## 2020-09-27 ENCOUNTER — Telehealth: Payer: Self-pay | Admitting: Family Medicine

## 2020-09-27 ENCOUNTER — Other Ambulatory Visit: Payer: Self-pay | Admitting: Internal Medicine

## 2020-09-27 DIAGNOSIS — I10 Essential (primary) hypertension: Secondary | ICD-10-CM

## 2020-09-27 NOTE — Telephone Encounter (Signed)
Call to patient- attempted to schedule follow up appointment- she wants to call back after she gets her calendar.Courtesy RF #30 given

## 2020-09-27 NOTE — Telephone Encounter (Signed)
Courtesy refill was given.

## 2020-09-27 NOTE — Telephone Encounter (Signed)
Pt has not been seen in a while, she has upcoming appointment with Wynetta Emery.

## 2020-09-27 NOTE — Telephone Encounter (Signed)
Medication: amLODipine (NORVASC) 10 MG tablet [[id number]] - Pharmacy is requesting script to be sent under a different provider. Pt has not seen a different provider.  Has the patient contacted their pharmacy? YES (Agent: If no, request that the patient contact the pharmacy for the refill.) (Agent: If yes, when and what did the pharmacy advise?)  Preferred Pharmacy (with phone number or street name):  [facility] [phone] - [name], [place] [address] [place] [address] Phone: [phone] Fax: [phone] Hours: Not open 24 hours   Agent: Please be advised that RX refills may take up to 3 business days. We ask that you follow-up with your pharmacy.

## 2020-09-30 ENCOUNTER — Other Ambulatory Visit: Payer: Self-pay | Admitting: *Deleted

## 2020-09-30 DIAGNOSIS — O039 Complete or unspecified spontaneous abortion without complication: Secondary | ICD-10-CM

## 2020-09-30 NOTE — Telephone Encounter (Signed)
Noted  

## 2020-10-01 ENCOUNTER — Other Ambulatory Visit: Payer: Self-pay

## 2020-10-01 ENCOUNTER — Other Ambulatory Visit: Payer: Medicaid Other

## 2020-10-01 DIAGNOSIS — O039 Complete or unspecified spontaneous abortion without complication: Secondary | ICD-10-CM | POA: Diagnosis not present

## 2020-10-02 LAB — BETA HCG QUANT (REF LAB): hCG Quant: 2 m[IU]/mL

## 2020-10-24 ENCOUNTER — Other Ambulatory Visit: Payer: Self-pay | Admitting: Physician Assistant

## 2020-10-24 DIAGNOSIS — I10 Essential (primary) hypertension: Secondary | ICD-10-CM

## 2020-10-24 NOTE — Telephone Encounter (Signed)
Courtesy refill . Future visit in 1 month  

## 2020-11-29 ENCOUNTER — Other Ambulatory Visit: Payer: Self-pay

## 2020-11-29 ENCOUNTER — Encounter: Payer: Self-pay | Admitting: Internal Medicine

## 2020-11-29 ENCOUNTER — Ambulatory Visit: Payer: Medicaid Other | Attending: Internal Medicine | Admitting: Internal Medicine

## 2020-11-29 DIAGNOSIS — K219 Gastro-esophageal reflux disease without esophagitis: Secondary | ICD-10-CM

## 2020-11-29 DIAGNOSIS — R1319 Other dysphagia: Secondary | ICD-10-CM

## 2020-11-29 DIAGNOSIS — I10 Essential (primary) hypertension: Secondary | ICD-10-CM

## 2020-11-29 DIAGNOSIS — D509 Iron deficiency anemia, unspecified: Secondary | ICD-10-CM

## 2020-11-29 MED ORDER — AMLODIPINE BESYLATE 10 MG PO TABS: ORAL_TABLET | ORAL | 5 refills | Status: DC

## 2020-11-29 MED ORDER — OMEPRAZOLE 20 MG PO CPDR
20.0000 mg | DELAYED_RELEASE_CAPSULE | Freq: Every day | ORAL | 3 refills | Status: DC
Start: 2020-11-29 — End: 2022-02-10

## 2020-11-29 NOTE — Progress Notes (Addendum)
Virtual Visit via Telephone Note  I connected with Brenda Colon on 11/29/2020 at 10:37 a.m by telephone and verified that I am speaking with the correct person using two identifiers  Location: Patient: car place.  She is in a private place Provider: office  Participants: Myself Patient   I discussed the limitations, risks, security and privacy concerns of performing an evaluation and management service by telephone and the availability of in person appointments. I also discussed with the patient that there may be a patient responsible charge related to this service. The patient expressed understanding and agreed to proceed.   History of Present Illness: Patient with history of HTN, headaches, GERD.  PCP was Dr. Chapman Fitch who is no longer with the practice.  HTN:  reports compliance with Norvasc and salt restriction.  Needs RF. Denies CP/SOB/LE edema  Reports intermittent dysphagia with solids x 2 wks.  Feels like food gets stuck in chest and has to drink fluids to help get it to go down.  Painful going down.  Has gained wgh.  No blood in stools.  No fhx of GI cancers.  Gives hx of GERD. Gets burning in chest and abdomen when she drinks coffee and with certain foods like pizza.  Takes Zantac at bedtime which helps.    Outpatient Encounter Medications as of 11/29/2020  Medication Sig   acetaminophen (TYLENOL) 500 MG tablet Take 500 mg by mouth every 6 (six) hours as needed. (Patient not taking: Reported on 08/29/2020)   amLODipine (NORVASC) 10 MG tablet TAKE 1 TABLET(10 MG) BY MOUTH DAILY   FOLIC ACID PO Take by mouth. (Patient not taking: Reported on 08/29/2020)   Melatonin 10 MG CAPS Take 1 capsule by mouth daily. (Patient not taking: Reported on 08/29/2020)   misoprostol (CYTOTEC) 200 MCG tablet insert four tablets vaginally (Patient not taking: Reported on 08/29/2020)   Multiple Vitamin (MULTIVITAMIN) tablet Take 1 tablet by mouth daily. (Patient not taking: Reported on 08/29/2020)    promethazine (PHENERGAN) 12.5 MG tablet Take 1 tablet (12.5 mg total) by mouth every 6 (six) hours as needed for nausea or vomiting. (Patient not taking: Reported on 08/29/2020)   [DISCONTINUED] NIFEdipine (PROCARDIA-XL/ADALAT-CC/NIFEDICAL-XL) 30 MG 24 hr tablet Take by mouth once per day.   No facility-administered encounter medications on file as of 11/29/2020.      Observations/Objective: No direct observation done as this was a telephone encounter.  Assessment and Plan: 1. Essential hypertension Continue Norvasc.  Encouraged to check blood pressure at least twice a week and record the readings.  We will have her see the clinical pharmacist in 2 to 3 weeks for blood pressure check.  Advised to bring her readings with her. - CBC - Comprehensive metabolic panel - Lipid panel - amLODipine (NORVASC) 10 MG tablet; TAKE 1 TABLET(10 MG) BY MOUTH DAILY  Dispense: 30 tablet; Refill: 5  2. Esophageal dysphagia She does not have red flag signs of weight loss or change in bowel movements.  Uncontrolled GERD may be playing a role.  I recommend discontinuing Zantac.  We will try a PPI instead.  GERD precautions discussed and encourage.  Advised to avoid spicy foods, foods that have tomato paste or tomato sauce, juices and avoid drinking too many caffeinated beverages.  She should eat her last meal at least 2 to 3 hours before laying down at night and sleep with her head a little elevated.  If symptoms do not resolve after being on omeprazole for 2 weeks, I advised that she  call me back to let me know so that we can refer her to the gastroenterologist for endoscopy.  She expressed understanding and is agreeable to the plan. - omeprazole (PRILOSEC) 20 MG capsule; Take 1 capsule (20 mg total) by mouth daily.  Dispense: 30 capsule; Refill: 3  3. GERD without esophagitis See #2 above. - omeprazole (PRILOSEC) 20 MG capsule; Take 1 capsule (20 mg total) by mouth daily.  Dispense: 30 capsule; Refill:  3  Addendum: 11/30/2020:  pt with mild anemia on blood tests.  Will add iron studies.  Follow Up Instructions: 6-7 weeks.   I discussed the assessment and treatment plan with the patient. The patient was provided an opportunity to ask questions and all were answered. The patient agreed with the plan and demonstrated an understanding of the instructions.   The patient was advised to call back or seek an in-person evaluation if the symptoms worsen or if the condition fails to improve as anticipated.  I  Spent 12 minutes on this telephone encounter  Karle Plumber, MD

## 2020-11-30 ENCOUNTER — Encounter: Payer: Self-pay | Admitting: Internal Medicine

## 2020-11-30 DIAGNOSIS — E782 Mixed hyperlipidemia: Secondary | ICD-10-CM | POA: Insufficient documentation

## 2020-11-30 LAB — COMPREHENSIVE METABOLIC PANEL
ALT: 7 IU/L (ref 0–32)
AST: 15 IU/L (ref 0–40)
Albumin/Globulin Ratio: 1.3 (ref 1.2–2.2)
Albumin: 4 g/dL (ref 3.8–4.8)
Alkaline Phosphatase: 82 IU/L (ref 44–121)
BUN/Creatinine Ratio: 12 (ref 9–23)
BUN: 11 mg/dL (ref 6–24)
Bilirubin Total: 0.3 mg/dL (ref 0.0–1.2)
CO2: 25 mmol/L (ref 20–29)
Calcium: 9.2 mg/dL (ref 8.7–10.2)
Chloride: 100 mmol/L (ref 96–106)
Creatinine, Ser: 0.92 mg/dL (ref 0.57–1.00)
Globulin, Total: 3.2 g/dL (ref 1.5–4.5)
Glucose: 91 mg/dL (ref 65–99)
Potassium: 3.9 mmol/L (ref 3.5–5.2)
Sodium: 139 mmol/L (ref 134–144)
Total Protein: 7.2 g/dL (ref 6.0–8.5)
eGFR: 81 mL/min/{1.73_m2} (ref >59)

## 2020-11-30 LAB — LIPID PANEL
Chol/HDL Ratio: 4.8 ratio — ABNORMAL HIGH (ref 0.0–4.4)
Cholesterol, Total: 206 mg/dL — ABNORMAL HIGH (ref 100–199)
HDL: 43 mg/dL (ref >39)
LDL Chol Calc (NIH): 148 mg/dL — ABNORMAL HIGH (ref 0–99)
Triglycerides: 85 mg/dL (ref 0–149)
VLDL Cholesterol Cal: 15 mg/dL (ref 5–40)

## 2020-11-30 LAB — CBC
Hematocrit: 36.1 % (ref 34.0–46.6)
Hemoglobin: 11.1 g/dL (ref 11.1–15.9)
MCH: 22.9 pg — ABNORMAL LOW (ref 26.6–33.0)
MCHC: 30.7 g/dL — ABNORMAL LOW (ref 31.5–35.7)
MCV: 74 fL — ABNORMAL LOW (ref 79–97)
Platelets: 319 10*3/uL (ref 150–450)
RBC: 4.85 x10E6/uL (ref 3.77–5.28)
RDW: 16 % — ABNORMAL HIGH (ref 11.7–15.4)
WBC: 6.2 10*3/uL (ref 3.4–10.8)

## 2020-11-30 NOTE — Addendum Note (Signed)
Addended by: Karle Plumber B on: 11/30/2020 06:46 PM   Modules accepted: Orders

## 2020-12-05 ENCOUNTER — Other Ambulatory Visit: Payer: Self-pay | Admitting: Internal Medicine

## 2020-12-05 LAB — IRON,TIBC AND FERRITIN PANEL
Ferritin: 10 ng/mL — ABNORMAL LOW (ref 15–150)
Iron Saturation: 9 % — CL (ref 15–55)
Iron: 30 ug/dL (ref 27–159)
Total Iron Binding Capacity: 326 ug/dL (ref 250–450)
UIBC: 296 ug/dL (ref 131–425)

## 2020-12-05 LAB — SPECIMEN STATUS REPORT

## 2020-12-05 MED ORDER — FERROUS SULFATE 325 (65 FE) MG PO TABS: 325.0000 mg | ORAL_TABLET | Freq: Every day | ORAL | 1 refills | Status: DC

## 2020-12-17 ENCOUNTER — Ambulatory Visit: Payer: Medicaid Other | Admitting: Pharmacist

## 2021-01-07 ENCOUNTER — Other Ambulatory Visit: Payer: Self-pay | Admitting: Physician Assistant

## 2021-01-07 DIAGNOSIS — I10 Essential (primary) hypertension: Secondary | ICD-10-CM

## 2021-01-07 NOTE — Telephone Encounter (Signed)
   Notes to clinic Has new rx by Dr Karle Plumber, however, there is no PCP listed, please assess.

## 2021-01-24 ENCOUNTER — Ambulatory Visit: Payer: Medicaid Other | Admitting: Internal Medicine

## 2021-05-07 ENCOUNTER — Other Ambulatory Visit: Payer: Self-pay | Admitting: Internal Medicine

## 2021-05-07 DIAGNOSIS — R1319 Other dysphagia: Secondary | ICD-10-CM

## 2021-05-07 DIAGNOSIS — K219 Gastro-esophageal reflux disease without esophagitis: Secondary | ICD-10-CM

## 2021-05-09 NOTE — Telephone Encounter (Signed)
Requested medication (s) are due for refill today: YES  Requested medication (s) are on the active medication list: yes  Last refill:  04/01/21  Future visit scheduled: NO, NO SHOW 01/24/21 for 6-7 week follow up  Notes to clinic:  Seen 11/29/20  which passes protocol, however, asked to return in 6-7 weeks and did not, 2 NO Shows, no upcoming visit scheduled. No PCP listed.      Requested Prescriptions  Pending Prescriptions Disp Refills   omeprazole (PRILOSEC) 20 MG capsule [Pharmacy Med Name: OMEPRAZOLE 20MG  CAPSULES] 30 capsule 3    Sig: TAKE 1 CAPSULE(20 MG) BY MOUTH DAILY     Gastroenterology: Proton Pump Inhibitors Passed - 05/07/2021  6:18 PM      Passed - Valid encounter within last 12 months    Recent Outpatient Visits           5 months ago Essential hypertension   [place], [place]   1 year ago Essential hypertension   [place], [place]   2 years ago Hypertension, unspecified type   Marydel [facility], [place], [place]

## 2021-09-17 ENCOUNTER — Other Ambulatory Visit: Payer: Self-pay | Admitting: Family Medicine

## 2021-09-17 DIAGNOSIS — I10 Essential (primary) hypertension: Secondary | ICD-10-CM

## 2021-12-25 ENCOUNTER — Other Ambulatory Visit: Payer: Self-pay | Admitting: Family Medicine

## 2021-12-25 ENCOUNTER — Ambulatory Visit: Payer: Medicaid Other | Admitting: Physician Assistant

## 2021-12-25 ENCOUNTER — Other Ambulatory Visit: Payer: Self-pay | Admitting: Pharmacist

## 2021-12-25 DIAGNOSIS — I10 Essential (primary) hypertension: Secondary | ICD-10-CM

## 2021-12-25 MED ORDER — AMLODIPINE BESYLATE 10 MG PO TABS: 10.0000 mg | ORAL_TABLET | Freq: Every day | ORAL | 0 refills | Status: DC

## 2022-01-15 ENCOUNTER — Encounter: Payer: Self-pay | Admitting: Physician Assistant

## 2022-01-15 ENCOUNTER — Ambulatory Visit: Payer: Medicaid Other | Attending: Physician Assistant | Admitting: Physician Assistant

## 2022-01-15 VITALS — BP 128/88 | HR 88 | Temp 98.3°F | Resp 16 | Ht 66.0 in | Wt 278.0 lb

## 2022-01-15 DIAGNOSIS — I10 Essential (primary) hypertension: Secondary | ICD-10-CM

## 2022-01-15 DIAGNOSIS — R3589 Other polyuria: Secondary | ICD-10-CM | POA: Diagnosis not present

## 2022-01-15 DIAGNOSIS — Z840 Family history of diseases of the skin and subcutaneous tissue: Secondary | ICD-10-CM | POA: Diagnosis not present

## 2022-01-15 DIAGNOSIS — R350 Frequency of micturition: Secondary | ICD-10-CM | POA: Diagnosis not present

## 2022-01-15 LAB — POCT URINALYSIS DIP (CLINITEK)
Glucose, UA: NEGATIVE mg/dL
Ketones, POC UA: NEGATIVE mg/dL
Leukocytes, UA: NEGATIVE
Nitrite, UA: NEGATIVE
POC PROTEIN,UA: 30 — AB
Spec Grav, UA: >=1.03 — AB (ref 1.010–1.025)
Urobilinogen, UA: 0.2 E.U./dL
pH, UA: 5.5 (ref 5.0–8.0)

## 2022-01-15 LAB — POCT URINE PREGNANCY: Preg Test, Ur: NEGATIVE

## 2022-01-15 LAB — GLUCOSE, POCT (MANUAL RESULT ENTRY): POC Glucose: 92 mg/dl (ref 70–99)

## 2022-01-15 MED ORDER — AMLODIPINE BESYLATE 10 MG PO TABS: 10.0000 mg | ORAL_TABLET | Freq: Every day | ORAL | 1 refills | Status: DC

## 2022-01-15 NOTE — Patient Instructions (Signed)
Drink 80-100 ounces water daily 

## 2022-01-15 NOTE — Progress Notes (Signed)
Patient ID: RENESSA Colon, female   DOB: [birth year], 41 y.o.   MRN: [id number]   Brenda Colon, is a 41 y.o. female  ZWC:[id number]  MPN:[id number]  DOB - Dec 01, 1980  Chief Complaint  Patient presents with   Hypertension    B/p is going down, polyuria, nocturia, urgent urination. Tired all the time.        Subjective:   Brenda Colon is a 41 y.o. female here today for med RF.  BP OOO is controlled.    She wants to be screened for lupus bc everyone in her family(siblings and parents) have it.  She denies knowing of any personal s/sx.  She is still having frequent urination/polyuria, esp at night.    No problems updated.  ALLERGIES: No Known Allergies  PAST MEDICAL HISTORY: Past Medical History:  Diagnosis Date   Hypertension    STD (sexually transmitted disease)    Gonorrhea,Trich   Uterine fibroid     MEDICATIONS AT HOME: Prior to Admission medications   Medication Sig Start Date End Date Taking? Authorizing Provider  amLODipine (NORVASC) 10 MG tablet Take 1 tablet (10 mg total) by mouth daily. 01/15/22   Argentina Donovan, PA-C  ferrous sulfate 325 (65 FE) MG tablet Take 1 tablet (325 mg total) by mouth daily with breakfast. Patient not taking: Reported on 01/15/2022 12/05/20   Ladell Pier, MD  omeprazole (PRILOSEC) 20 MG capsule Take 1 capsule (20 mg total) by mouth daily. Patient not taking: Reported on 01/15/2022 11/29/20   Ladell Pier, MD  NIFEdipine (PROCARDIA-XL/ADALAT-CC/NIFEDICAL-XL) 30 MG 24 hr tablet Take by mouth once per day. 12/02/17 12/22/18  [name], MD    ROS: Neg HEENT Neg resp Neg cardiac Neg GI Neg GU Neg MS Neg psych Neg neuro  Objective:   Vitals:   01/15/22 1408 01/15/22 1438  BP: (!) 134/98 128/88  Pulse: 88   Resp: 16   Temp: 98.3 F (36.8 C)   SpO2: 98%   Weight: 278 lb (126.1 kg)   Height: '5\' 6"'$  (1.676 m)    Exam General appearance : Awake, alert, not in any distress. Speech Clear. Not toxic looking HEENT:  Atraumatic and Normocephalic Neck: Supple, no JVD. No cervical lymphadenopathy.  Chest: Good air entry bilaterally, CTAB.  No rales/rhonchi/wheezing CVS: S1 S2 regular, no murmurs.  Extremities: B/L Lower Ext shows no edema, both legs are warm to touch Neurology: Awake alert, and oriented X 3, CN II-XII intact, Non focal Skin: No Rash  Data Review Lab Results  Component Value Date   HGBA1C 5.4 01/12/2019   HGBA1C 5.4 07/29/2017    Assessment & Plan   1. Polyuria Increase water intake in the daytime vs night time(she drinks a lot at night - Hemoglobin A1c - POCT URINALYSIS DIP (CLINITEK) - POCT urine pregnancy - Glucose (CBG)  2. Urinary frequency - POCT URINALYSIS DIP (CLINITEK)  3. Essential hypertension Controlled-continue - Comprehensive metabolic panel - amLODipine (NORVASC) 10 MG tablet; Take 1 tablet (10 mg total) by mouth daily.  Dispense: 90 tablet; Refill: 1 - CBC with Differential/Platelet  4. Family history of lupus erythematosus - ANA w/Reflex    Return in about 6 months (around 07/18/2022) for assign PCP.  The patient was given clear instructions to go to ER or return to medical center if symptoms don't improve, worsen or new problems develop. The patient verbalized understanding. The patient was told to call to get lab results if they haven't heard anything in the next  week.      Freeman Caldron, PA-C [facility] and [facility] [place], [place] [facility]   01/15/2022, 2:41 PM

## 2022-01-20 LAB — CBC WITH DIFFERENTIAL/PLATELET
Basophils Absolute: 0 10*3/uL (ref 0.0–0.2)
Basos: 1 %
EOS (ABSOLUTE): 0.1 10*3/uL (ref 0.0–0.4)
Eos: 1 %
Hematocrit: 41.4 % (ref 34.0–46.6)
Hemoglobin: 13.3 g/dL (ref 11.1–15.9)
Immature Grans (Abs): 0 10*3/uL (ref 0.0–0.1)
Immature Granulocytes: 0 %
Lymphocytes Absolute: 2.8 10*3/uL (ref 0.7–3.1)
Lymphs: 34 %
MCH: 26.1 pg — ABNORMAL LOW (ref 26.6–33.0)
MCHC: 32.1 g/dL (ref 31.5–35.7)
MCV: 81 fL (ref 79–97)
Monocytes Absolute: 0.6 10*3/uL (ref 0.1–0.9)
Monocytes: 8 %
Neutrophils Absolute: 4.7 10*3/uL (ref 1.4–7.0)
Neutrophils: 56 %
Platelets: 297 10*3/uL (ref 150–450)
RBC: 5.09 x10E6/uL (ref 3.77–5.28)
RDW: 14.3 % (ref 11.7–15.4)
WBC: 8.3 10*3/uL (ref 3.4–10.8)

## 2022-01-20 LAB — ENA+DNA/DS+ANTICH+CENTRO+FA...
Anti JO-1: <0.2 AI (ref 0.0–0.9)
Antiribosomal P Antibodies: <0.2 AI (ref 0.0–0.9)
Centromere Ab Screen: <0.2 AI (ref 0.0–0.9)
Chromatin Ab SerPl-aCnc: <0.2 AI (ref 0.0–0.9)
ENA RNP Ab: <0.2 AI (ref 0.0–0.9)
ENA SM Ab Ser-aCnc: <0.2 AI (ref 0.0–0.9)
ENA SSA (RO) Ab: <0.2 AI (ref 0.0–0.9)
ENA SSB (LA) Ab: 0.4 AI (ref 0.0–0.9)
Scleroderma (Scl-70) (ENA) Antibody, IgG: <0.2 AI (ref 0.0–0.9)
Smith/RNP Antibodies: <0.2 AI (ref 0.0–0.9)
Speckled Pattern: 1:160 {titer} — ABNORMAL HIGH
dsDNA Ab: <1 IU/mL (ref 0–9)

## 2022-01-20 LAB — COMPREHENSIVE METABOLIC PANEL
ALT: 8 IU/L (ref 0–32)
AST: 12 IU/L (ref 0–40)
Albumin/Globulin Ratio: 1.4 (ref 1.2–2.2)
Albumin: 4.4 g/dL (ref 3.9–4.9)
Alkaline Phosphatase: 102 IU/L (ref 44–121)
BUN/Creatinine Ratio: 12 (ref 9–23)
BUN: 11 mg/dL (ref 6–24)
Bilirubin Total: 0.3 mg/dL (ref 0.0–1.2)
CO2: 22 mmol/L (ref 20–29)
Calcium: 9.3 mg/dL (ref 8.7–10.2)
Chloride: 101 mmol/L (ref 96–106)
Creatinine, Ser: 0.91 mg/dL (ref 0.57–1.00)
Globulin, Total: 3.2 g/dL (ref 1.5–4.5)
Glucose: 73 mg/dL (ref 70–99)
Potassium: 3.7 mmol/L (ref 3.5–5.2)
Sodium: 139 mmol/L (ref 134–144)
Total Protein: 7.6 g/dL (ref 6.0–8.5)
eGFR: 81 mL/min/{1.73_m2} (ref >59)

## 2022-01-20 LAB — HEMOGLOBIN A1C
Est. average glucose Bld gHb Est-mCnc: 123 mg/dL
Hgb A1c MFr Bld: 5.9 % — ABNORMAL HIGH (ref 4.8–5.6)

## 2022-01-20 LAB — ANA W/REFLEX: ANA Titer 1: POSITIVE — AB

## 2022-01-28 ENCOUNTER — Other Ambulatory Visit: Payer: Self-pay | Admitting: Family Medicine

## 2022-01-28 DIAGNOSIS — I10 Essential (primary) hypertension: Secondary | ICD-10-CM

## 2022-02-10 ENCOUNTER — Ambulatory Visit: Payer: Medicaid Other | Attending: Internal Medicine | Admitting: Internal Medicine

## 2022-02-10 ENCOUNTER — Encounter: Payer: Self-pay | Admitting: Internal Medicine

## 2022-02-10 VITALS — BP 131/95 | HR 89 | Ht 66.0 in | Wt 282.2 lb

## 2022-02-10 DIAGNOSIS — I10 Essential (primary) hypertension: Secondary | ICD-10-CM

## 2022-02-10 DIAGNOSIS — R7303 Prediabetes: Secondary | ICD-10-CM | POA: Diagnosis not present

## 2022-02-10 DIAGNOSIS — R3581 Nocturnal polyuria: Secondary | ICD-10-CM

## 2022-02-10 DIAGNOSIS — Z23 Encounter for immunization: Secondary | ICD-10-CM | POA: Diagnosis not present

## 2022-02-10 DIAGNOSIS — Z6841 Body Mass Index (BMI) 40.0 and over, adult: Secondary | ICD-10-CM

## 2022-02-10 DIAGNOSIS — K219 Gastro-esophageal reflux disease without esophagitis: Secondary | ICD-10-CM

## 2022-02-10 MED ORDER — OMEPRAZOLE 20 MG PO CPDR: 20.0000 mg | DELAYED_RELEASE_CAPSULE | Freq: Every day | ORAL | 3 refills | Status: DC

## 2022-02-10 NOTE — Progress Notes (Signed)
Patient ID: Brenda Colon, female    DOB: 03-02-1981  MRN: [id number]  CC: New Patient (Initial Visit) (Patient states to still having frequent urination and having tiredness) and Medication Refill   Subjective: Brenda Colon is a 41 y.o. female who presents to formally est care with me.  Previous PCP was Dr. Chapman Colon who is no longer with the practice. Her concerns today include:  Pt with hx of HTN, PreDM, obesity  Needing RF on Omeprazole.  Takes daily; if she does not take, GERD symptoms get bad at nights.  She tries to avoid foods that cause heart burn.  HTN: BP elev today.  Takes Norvasc 10 mg at nights but forgot to take last night. -checks BP 1-2x/wk at work.  Gives range in the 120s/70-81. Tries to limit salt in foods  C/o frequent urination all the time but mainly at nights for a few yrs.  Gets up about every hr to urinate.  She does not feel like she completely empties and has to strain sometimes to get flow going.  Some incontinence if she sneeze or laugh too hard.  Endorses being told that she has fibroids during last pregnancy 2019.  However, I see she had pelvic US in 08/2020 when she had miscarriage and no fibroid seen. Screen for DM on last visit was in range for PreDM.  UA was negative for infection  Obesity:  trying to changing diet and drinking more water.  She has cut back on drinking sodas and now eating more salads. She also cut back on bread  Patient Active Problem List   Diagnosis Date Noted   Hyperlipemia, mixed 11/30/2020   Esophageal dysphagia 11/29/2020   Essential hypertension 11/29/2020   Intrauterine pregnancy 08/21/2020   Pregnancy with uncertain fetal viability 08/21/2020   Chronic hypertension affecting pregnancy 12/30/2017   Family history of microcephaly 09/14/2017   Fibroid 08/15/2017   Chronic hypertension during pregnancy, antepartum 03/26/2017   GERD with esophagitis 03/26/2017     Current Outpatient Medications on File Prior to Visit   Medication Sig Dispense Refill   amLODipine (NORVASC) 10 MG tablet Take 1 tablet (10 mg total) by mouth daily. 90 tablet 1   omeprazole (PRILOSEC) 20 MG capsule Take 1 capsule (20 mg total) by mouth daily. (Patient not taking: Reported on 01/15/2022) 30 capsule 3   [DISCONTINUED] NIFEdipine (PROCARDIA-XL/ADALAT-CC/NIFEDICAL-XL) 30 MG 24 hr tablet Take by mouth once per day. 30 tablet 2   No current facility-administered medications on file prior to visit.    No Known Allergies  Social History   Socioeconomic History   Marital status: Single    Spouse name: Not on file   Number of children: Not on file   Years of education: Not on file   Highest education level: Not on file  Occupational History   Not on file  Tobacco Use   Smoking status: Never   Smokeless tobacco: Never  Vaping Use   Vaping Use: Never used  Substance and Sexual Activity   Alcohol use: No   Drug use: No   Sexual activity: Not Currently    Partners: Male    Birth control/protection: None    Comment: 1st intercourse 73 yo-5 partners  Other Topics Concern   Not on file  Social History Narrative   Not on file   Social Determinants of Health   Financial Resource Strain: Not on file  Food Insecurity: Not on file  Transportation Needs: Not on file  Physical Activity:  Not on file  Stress: Not on file  Social Connections: Not on file  Intimate Partner Violence: Not on file    Family History  Problem Relation Age of Onset   Diabetes Mother    Diabetes Father    Lupus Sister     Past Surgical History:  Procedure Laterality Date   CYSTECTOMY     Wrist   INDUCED ABORTION      ROS: Review of Systems Negative except as stated above  PHYSICAL EXAM: BP (!) 131/95   Pulse 89   Ht '5\' 6"'$  (1.676 m)   Wt 282 lb 3.2 oz (128 kg)   SpO2 100%   BMI 45.55 kg/m   Physical Exam  General appearance - alert, well appearing, middle age obese AAF and in no distress Mental status - normal mood, behavior,  speech, dress, motor activity, and thought processes Neck - supple, no significant adenopathy Chest - clear to auscultation, no wheezes, rales or rhonchi, symmetric air entry Heart - normal rate, regular rhythm, normal S1, S2, no murmurs, rubs, clicks or gallops Extremities - peripheral pulses normal, no pedal edema, no clubbing or cyanosis      Latest Ref Rng & Units 01/15/2022    2:48 PM 11/29/2020    9:35 AM 03/07/2020    9:19 AM  CMP  Glucose 70 - 99 mg/dL 73  91  91   BUN 6 - 24 mg/dL '11  11  10   '$ Creatinine 0.57 - 1.00 mg/dL 0.91  0.92  0.84   Sodium 134 - 144 mmol/L 139  139  141   Potassium 3.5 - 5.2 mmol/L 3.7  3.9  4.4   Chloride 96 - 106 mmol/L 101  100  102   CO2 20 - 29 mmol/L '22  25  25   '$ Calcium 8.7 - 10.2 mg/dL 9.3  9.2  9.2   Total Protein 6.0 - 8.5 g/dL 7.6  7.2    Total Bilirubin 0.0 - 1.2 mg/dL 0.3  0.3    Alkaline Phos 44 - 121 IU/L 102  82    AST 0 - 40 IU/L 12  15    ALT 0 - 32 IU/L 8  7     Lipid Panel     Component Value Date/Time   CHOL 206 (H) 11/29/2020 0935   TRIG 85 11/29/2020 0935   HDL 43 11/29/2020 0935   CHOLHDL 4.8 (H) 11/29/2020 0935   LDLCALC 148 (H) 11/29/2020 0935    CBC    Component Value Date/Time   WBC 8.3 01/15/2022 1448   WBC 10.0 08/23/2020 0921   RBC 5.09 01/15/2022 1448   RBC 5.10 08/23/2020 0921   HGB 13.3 01/15/2022 1448   HCT 41.4 01/15/2022 1448   PLT 297 01/15/2022 1448   MCV 81 01/15/2022 1448   MCH 26.1 (L) 01/15/2022 1448   MCH 25.1 (L) 08/23/2020 0921   MCHC 32.1 01/15/2022 1448   MCHC 31.7 08/23/2020 0921   RDW 14.3 01/15/2022 1448   LYMPHSABS 2.8 01/15/2022 1448   EOSABS 0.1 01/15/2022 1448   BASOSABS 0.0 01/15/2022 1448    ASSESSMENT AND PLAN: 1. Essential hypertension Not at goal.  She forgot to take Norvasc last night but will take it tonight.  Encourage compliance.  Reported home BP readings at goal.  DASH diet encouraged.  2. GERD without esophagitis Refill omeprazole. GERD precautions  discussed.  Advised to avoid certain foods like spicy foods, tomato-based foods, juices and excessive caffeine.  Advised  to eat his last meal at least 2 to 3 hours before laying down at nights and to sleep with his head slightly elevated.     3. Morbid obesity ([place]) Patient advised to eliminate sugary drinks from the diet, cut back on portion sizes especially of white carbohydrates, eat more white lean meat like chicken [place] and seafood instead of beef or pork and incorporate fresh fruits and vegetables into the diet daily. -Encouraged her to get in some form of moderate intensity exercise 3 to 5 days a week for 30 minutes.  4. Prediabetes See #3 above  5. Nocturnal polyuria - Ambulatory referral to Urogynecology  6. Need for immunization against influenza - Flu Vaccine QUAD 47moIM (Fluarix, Fluzone & Alfiuria Quad PF)     Patient was given the opportunity to ask questions.  Patient verbalized understanding of the plan and was able to repeat key elements of the plan.   This documentation was completed using DRadio producer  Any transcriptional errors are unintentional.  Orders Placed This Encounter  Procedures   Flu Vaccine QUAD 658moM (Fluarix, Fluzone & Alfiuria Quad PF)   Ambulatory referral to Urogynecology     Requested Prescriptions    No prescriptions requested or ordered in this encounter    Return in about 4 weeks (around 03/10/2022).  DeKarle PlumberMD, FACP

## 2022-02-10 NOTE — Patient Instructions (Signed)

## 2022-02-27 ENCOUNTER — Emergency Department (HOSPITAL_COMMUNITY): Payer: Medicaid Other

## 2022-02-27 ENCOUNTER — Emergency Department (HOSPITAL_COMMUNITY)
Admission: EM | Admit: 2022-02-27 | Discharge: 2022-02-27 | Disposition: A | Discharge status: Home / Self Care | Payer: Medicaid Other | Attending: Emergency Medicine | Admitting: Emergency Medicine

## 2022-02-27 ENCOUNTER — Ambulatory Visit: Payer: Self-pay

## 2022-02-27 ENCOUNTER — Encounter (HOSPITAL_COMMUNITY): Payer: Self-pay | Admitting: *Deleted

## 2022-02-27 ENCOUNTER — Other Ambulatory Visit: Payer: Self-pay

## 2022-02-27 DIAGNOSIS — M7989 Other specified soft tissue disorders: Secondary | ICD-10-CM | POA: Insufficient documentation

## 2022-02-27 DIAGNOSIS — I10 Essential (primary) hypertension: Secondary | ICD-10-CM | POA: Diagnosis not present

## 2022-02-27 DIAGNOSIS — R079 Chest pain, unspecified: Secondary | ICD-10-CM | POA: Diagnosis not present

## 2022-02-27 DIAGNOSIS — Z79899 Other long term (current) drug therapy: Secondary | ICD-10-CM | POA: Diagnosis not present

## 2022-02-27 DIAGNOSIS — R0789 Other chest pain: Secondary | ICD-10-CM | POA: Diagnosis not present

## 2022-02-27 DIAGNOSIS — R600 Localized edema: Secondary | ICD-10-CM | POA: Insufficient documentation

## 2022-02-27 DIAGNOSIS — M79602 Pain in left arm: Secondary | ICD-10-CM | POA: Diagnosis not present

## 2022-02-27 LAB — CBC WITH DIFFERENTIAL/PLATELET
Abs Immature Granulocytes: 0.02 10*3/uL (ref 0.00–0.07)
Basophils Absolute: 0.1 10*3/uL (ref 0.0–0.1)
Basophils Relative: 1 %
Eosinophils Absolute: 0.1 10*3/uL (ref 0.0–0.5)
Eosinophils Relative: 1 %
HCT: 39.2 % (ref 36.0–46.0)
Hemoglobin: 12.5 g/dL (ref 12.0–15.0)
Immature Granulocytes: 0 %
Lymphocytes Relative: 36 %
Lymphs Abs: 2.9 10*3/uL (ref 0.7–4.0)
MCH: 26.3 pg (ref 26.0–34.0)
MCHC: 31.9 g/dL (ref 30.0–36.0)
MCV: 82.5 fL (ref 80.0–100.0)
Monocytes Absolute: 0.7 10*3/uL (ref 0.1–1.0)
Monocytes Relative: 9 %
Neutro Abs: 4.2 10*3/uL (ref 1.7–7.7)
Neutrophils Relative %: 53 %
Platelets: 273 10*3/uL (ref 150–400)
RBC: 4.75 MIL/uL (ref 3.87–5.11)
RDW: 14.7 % (ref 11.5–15.5)
WBC: 7.9 10*3/uL (ref 4.0–10.5)
nRBC: 0 % (ref 0.0–0.2)

## 2022-02-27 LAB — BASIC METABOLIC PANEL
Anion gap: 9 (ref 5–15)
BUN: 9 mg/dL (ref 6–20)
CO2: 26 mmol/L (ref 22–32)
Calcium: 8.9 mg/dL (ref 8.9–10.3)
Chloride: 104 mmol/L (ref 98–111)
Creatinine, Ser: 0.92 mg/dL (ref 0.44–1.00)
GFR, Estimated: >60 mL/min (ref >60)
Glucose, Bld: 88 mg/dL (ref 70–99)
Potassium: 3.5 mmol/L (ref 3.5–5.1)
Sodium: 139 mmol/L (ref 135–145)

## 2022-02-27 LAB — TROPONIN I (HIGH SENSITIVITY)
Troponin I (High Sensitivity): 2 ng/L (ref <18)
Troponin I (High Sensitivity): <2 ng/L (ref <18)

## 2022-02-27 LAB — BRAIN NATRIURETIC PEPTIDE: B Natriuretic Peptide: 16 pg/mL (ref 0.0–100.0)

## 2022-02-27 NOTE — ED Notes (Signed)
Patient transported to X-ray 

## 2022-02-27 NOTE — ED Triage Notes (Signed)
Pt with bilateral feet swelling earlier this week since Sunday, has decreased some but not back to normal.  Pt with left shoulder blade pain since Monday, worse with movement.

## 2022-02-27 NOTE — ED Notes (Signed)
Pt. returned from XR. 

## 2022-02-27 NOTE — Discharge Instructions (Addendum)
You were seen in the emergency department today for arm pain and pain in your back.  Your work-up here has been reassuring however I am referring you to a cardiologist. If you have not heard from the Cardiology office within the next 72 hours please call [phone].  Additionally have attached some reading to your discharge instructions regarding venous insufficiency which is likely why her lower legs have been swollen over the past week.  Please use heat and massage over your left shoulder to see if this improves your arm pain, however if symptoms worsen or you become short of breath or began having worsening symptoms please return to the emergency department.

## 2022-02-27 NOTE — ED Notes (Signed)
Pt received her discharge papers, verbalized understanding

## 2022-02-27 NOTE — ED Provider Notes (Signed)
[facility] EMERGENCY DEPARTMENT Provider Note   CSN: [id number] Arrival date & time: 02/27/22  1323     History  Chief Complaint  Patient presents with   Arm Pain    left   Leg Swelling    Brenda GIAIMO is a 41 y.o. female.  With past medical history of hypertension, GERD, hyperlipidemia who presents to the emergency department with left arm pain.   States symptoms have been ongoing for greater than 1 week.  States that last week (cannot pinpoint a specific day) she began having pain to her left shoulder blade.  States that this progressed into Monday and Tuesday of this week.  She describes it as a constant left arm achiness that occasionally intensifies.  She states that the pain goes into her left shoulder blade from the arm.  She states that she has tried taking ibuprofen and Excedrin without improvement in her symptoms.  Additionally she states that this past Sunday she began having bilateral lower extremity swelling.  She states that it was "really big."  She states that her legs are hurting on Monday and Tuesday and then states that her swelling has improved since then.  She does state that by early evening most days she feels like her legs are tightening.  She denies any shortness of breath, palpitations, numbness or tingling to her arm, orthopnea or PND.   Arm Pain Associated symptoms include chest pain. Pertinent negatives include no shortness of breath.       Home Medications Prior to Admission medications   Medication Sig Start Date End Date Taking? Authorizing Provider  amLODipine (NORVASC) 10 MG tablet Take 1 tablet (10 mg total) by mouth daily. 01/15/22   Argentina Donovan, PA-C  omeprazole (PRILOSEC) 20 MG capsule Take 1 capsule (20 mg total) by mouth daily. 02/10/22   Ladell Pier, MD  NIFEdipine (PROCARDIA-XL/ADALAT-CC/NIFEDICAL-XL) 30 MG 24 hr tablet Take by mouth once per day. 12/02/17 12/22/18  [name], MD      Allergies    Patient has no known  allergies.    Review of Systems   Review of Systems  Constitutional:  Negative for diaphoresis and fever.  Respiratory:  Negative for shortness of breath.   Cardiovascular:  Positive for chest pain and leg swelling. Negative for palpitations.  Gastrointestinal:  Negative for nausea.  Musculoskeletal:  Positive for back pain and myalgias.  Neurological:  Negative for numbness.  All other systems reviewed and are negative.   Physical Exam Updated Vital Signs BP (!) 134/99 (BP Location: Right Arm)   Pulse 87   Temp 97.9 F (36.6 C) (Oral)   Resp 14   Ht '5\' 6"'$  (1.676 m)   Wt 117.9 kg   LMP 02/13/2022   SpO2 99%   BMI 41.97 kg/m  Physical Exam Vitals and nursing note reviewed.  Constitutional:      General: She is not in acute distress.    Appearance: Normal appearance. She is obese. She is not ill-appearing or toxic-appearing.  HENT:     Head: Normocephalic and atraumatic.     Mouth/Throat:     Mouth: Mucous membranes are moist.     Pharynx: Oropharynx is clear.  Eyes:     General: No scleral icterus.    Extraocular Movements: Extraocular movements intact.     Pupils: Pupils are equal, round, and reactive to light.  Cardiovascular:     Rate and Rhythm: Normal rate and regular rhythm.     Pulses:  Normal pulses.          Radial pulses are 2+ on the right side and 2+ on the left side.     Heart sounds: No murmur heard.    Comments: BLE pitting edema to ankles  Pulmonary:     Effort: Pulmonary effort is normal. No respiratory distress.     Breath sounds: Normal breath sounds.  Chest:     Chest wall: Tenderness present.       Comments: TTP of the left chest wall. States she feels it radiating to the scapula. Similar to symptoms over past week. Palpation of the arm does not reproduce her symptoms.  Abdominal:     Palpations: Abdomen is soft.  Musculoskeletal:        General: Normal range of motion.     Cervical back: Neck supple.     Right lower leg: 1+ Pitting Edema  present.     Left lower leg: 1+ Pitting Edema present.  Skin:    General: Skin is warm and dry.     Capillary Refill: Capillary refill takes less than 2 seconds.     Findings: No rash.  Neurological:     General: No focal deficit present.     Mental Status: She is alert and oriented to person, place, and time. Mental status is at baseline.  Psychiatric:        Mood and Affect: Mood normal.        Behavior: Behavior normal.        Thought Content: Thought content normal.        Judgment: Judgment normal.     ED Results / Procedures / Treatments   Labs (all labs ordered are listed, but only abnormal results are displayed) Labs Reviewed  BASIC METABOLIC PANEL  BRAIN NATRIURETIC PEPTIDE  CBC WITH DIFFERENTIAL/PLATELET  CBC WITH DIFFERENTIAL/PLATELET  TROPONIN I (HIGH SENSITIVITY)  TROPONIN I (HIGH SENSITIVITY)   EKG EKG Interpretation  Date/Time:  Friday February 27 2022 13:37:38 EDT Ventricular Rate:  92 PR Interval:  150 QRS Duration: 86 QT Interval:  334 QTC Calculation: 413 R Axis:   14 Text Interpretation: Normal sinus rhythm Inferior Q waves noted Abnormal ECG No previous ECGs available Confirmed by Garnette Gunner [phone]) on 02/27/2022 4:21:48 PM  Radiology DG Chest 2 View  Result Date: 02/27/2022 CLINICAL DATA:  Chest pain EXAM: CHEST - 2 VIEW COMPARISON:  None Available. FINDINGS: Mild airspace disease at the left base. Normal cardiac size. No pneumothorax. IMPRESSION: Mild airspace disease at the left base, atelectasis versus minimal infiltrate. Electronically Signed   By: Donavan Foil M.D.   On: 02/27/2022 15:15    Procedures Procedures   Medications Ordered in ED Medications - No data to display  ED Course/ Medical Decision Making/ A&P           HEART Score: 3                Medical Decision Making Amount and/or Complexity of Data Reviewed Labs: ordered. Radiology: ordered.  This patient presents to the ED with chief complaint(s) of right arm  pain with pertinent past medical history of hypertension, hyperlipidemia, GERD which further complicates the presenting complaint. The complaint involves an extensive differential diagnosis and also carries with it a high risk of complications and morbidity.    The differential diagnosis includes Acute chest syndrome, stable angina, atypical angina, pulmonary embolism, pneumothorax, aortic dissection, pleural effusion, CHF, COPD, asthma, myocarditis, pericarditis, cardiac tamponade, chest wall pain  Additional history obtained: Additional history obtained from  none available Records reviewed Care Everywhere/External Records and Primary Care Documents  ED Course and Reassessment: 41 year old female who presents to the emergency department with left scapular and left arm pain for greater than 1 week.  On physical exam she does have tenderness to palpation of the left chest wall that reproduces her left scapular pain.  I am unable to reproduce the left arm pain that she is describing she has equal, bilateral lower extremity pitting edema.  I do suspect that this may be chronic venous insufficiency, but will check BNP along with cardiac work-up.  Chest x-ray shows a questionable left lower lobe infiltrate.  She has no cough, fever, leukocytosis.  Do not feel that this is pneumonia or related to her symptoms at this time.   BNP unremarkable. Do not feel she has CHF at this time. No shortness of breath, pulmonary congestion. Her LE swelling is likely venous insufficiency. I have educated her on this. Will also add d/c instruction/education on this.   Symptoms are inconsistent with dissection, pleural effusion, COPD, asthma.  Considered but doubt PE. PERC negative. Will not pursue d-dimer to CTA PE study.  Troponin x2 negative. EKG is nonischemic, but abnormal. HEART Score: 3 Because I cannot reproduce her arm discomfort will place referral for OP cardiology. She's given strict return precautions  for worsening symptoms. She verbalizes understanding. Feel that she is otherwise well-appearance, non-toxic and hemodynamically stable, safe for discharge.     Independent labs interpretation:  The following labs were independently interpreted:  Troponin x2 negative, BNP negative   Independent visualization of imaging: - I independently visualized the following imaging with scope of interpretation limited to determining acute life threatening conditions related to emergency care: CXR, which revealed ?LLL infiltrate  Consultation: - Consulted or discussed management/test interpretation w/ external professional: not indicated  Consideration for admission or further workup: not indicated  Social Determinants of health: none identified  Final Clinical Impression(s) / ED Diagnoses Final diagnoses:  Left arm pain    Rx / DC Orders ED Discharge Orders          Ordered    Ambulatory referral to Cardiology       Comments: If you have not heard from the Cardiology office within the next 72 hours please call [phone].   02/27/22 1629              Mickie Hillier, PA-C 02/27/22 1659    Cristie Hem, MD 02/28/22 [phone]

## 2022-02-27 NOTE — Telephone Encounter (Signed)
Chief Complaint: Swelling both legs, left arm pain Symptoms: above Frequency: a few days Pertinent Negatives: Patient denies numbness in arm Disposition: '[x]'$ ED /'[]'$ Urgent Care (no appt availability in office) / '[]'$ Appointment(In office/virtual)/ '[]'$  [facility]/ '[]'$ Home Care/ '[]'$ Refused Recommended Disposition /'[]'$ [facility]/ '[]'$  Follow-up with PCP Additional Notes: PT has had swollen feet and ankles the past few days. Pt also has left arm shoulder and arm pain. Pt works at [employer] and will get checked out there.    Summary: Swelling in legs and feet/left shoulder pain   The patient called in stating she has had swelling in both legs and feet and pain in her back and into her left shoulder blade and arm. She does have high blood pressure but takes medication for it. Worries it maybe related  to her heart and was thinking blood work and maybe an EKG would be best. Please assist patient further.      Reason for Disposition  MILD or MODERATE ankle swelling (e.g., can't move joint normally, can't do usual activities) (Exceptions: Itchy, localized swelling; swelling is chronic.)  [1] MODERATE pain (e.g., interferes with normal activities) AND [2] present > 3 days  Answer Assessment - Initial Assessment Questions 1. LOCATION: "Which ankle is swollen?" "Where is the swelling?"     Both 2. ONSET: "When did the swelling start?"     Swelling 3. SWELLING: "How bad is the swelling?" Or, "How large is it?" (e.g., mild, moderate, severe; size of localized swelling)    - NONE: No joint swelling.   - LOCALIZED: Localized; small area of puffy or swollen skin (e.g., insect bite, skin irritation).   - MILD: Joint looks or feels mildly swollen or puffy.   - MODERATE: Swollen; interferes with normal activities (e.g., work or school); decreased range of movement; may be limping.   - SEVERE: Very swollen; can't move swollen joint at all; limping a lot or unable to walk.     Moderate 4.  PAIN: "Is there any pain?" If Yes, ask: "How bad is it?" (Scale 1-10; or mild, moderate, severe)   - NONE (0): no pain.   - MILD (1-3): doesn't interfere with normal activities.    - MODERATE (4-7): interferes with normal activities (e.g., work or school) or awakens from sleep, limping.    - SEVERE (8-10): excruciating pain, unable to do any normal activities, unable to walk.      moderate 5. CAUSE: "What do you think caused the ankle swelling?"     Heart 6. OTHER SYMPTOMS: "Do you have any other symptoms?" (e.g., fever, chest pain, difficulty breathing, calf pain)     Left arm pain 7. PREGNANCY: "Is there any chance you are pregnant?" "When was your last menstrual period?"     no  Answer Assessment - Initial Assessment Questions 1. ONSET: "When did the pain start?"     A few days 2. LOCATION: "Where is the pain located?"     Left arm 3. PAIN: "How bad is the pain?" (Scale 1-10; or mild, moderate, severe)   - MILD (1-3): Doesn't interfere with normal activities.   - MODERATE (4-7): Interferes with normal activities (e.g., work or school) or awakens from sleep.   - SEVERE (8-10): Excruciating pain, unable to do any normal activities, unable to hold a cup of water.     moderate 4. WORK OR EXERCISE: "Has there been any recent work or exercise that involved this part of the body?"  no 5. CAUSE: "What do you think is causing the arm pain?"     Unsure - may have slept wrong 6. OTHER SYMPTOMS: "Do you have any other symptoms?" (e.g., neck pain, swelling, rash, fever, numbness, weakness)     Swollen ankles 7. PREGNANCY: "Is there any chance you are pregnant?" "When was your last menstrual period?"     no  Protocols used: Ankle Swelling-A-AH, Arm Pain-A-AH

## 2022-02-27 NOTE — Telephone Encounter (Signed)
Pt is being seen in the ED

## 2022-03-03 ENCOUNTER — Encounter (HOSPITAL_COMMUNITY): Payer: Self-pay

## 2022-03-03 ENCOUNTER — Other Ambulatory Visit: Payer: Self-pay

## 2022-03-03 ENCOUNTER — Emergency Department (HOSPITAL_COMMUNITY): Payer: Medicaid Other

## 2022-03-03 ENCOUNTER — Emergency Department (HOSPITAL_COMMUNITY)
Admission: EM | Admit: 2022-03-03 | Discharge: 2022-03-03 | Disposition: A | Discharge status: Home / Self Care | Payer: Medicaid Other | Attending: Emergency Medicine | Admitting: Emergency Medicine

## 2022-03-03 DIAGNOSIS — Z79899 Other long term (current) drug therapy: Secondary | ICD-10-CM | POA: Insufficient documentation

## 2022-03-03 DIAGNOSIS — R600 Localized edema: Secondary | ICD-10-CM | POA: Diagnosis not present

## 2022-03-03 DIAGNOSIS — E876 Hypokalemia: Secondary | ICD-10-CM | POA: Diagnosis not present

## 2022-03-03 DIAGNOSIS — R079 Chest pain, unspecified: Secondary | ICD-10-CM | POA: Diagnosis not present

## 2022-03-03 DIAGNOSIS — M79602 Pain in left arm: Secondary | ICD-10-CM | POA: Diagnosis not present

## 2022-03-03 DIAGNOSIS — I10 Essential (primary) hypertension: Secondary | ICD-10-CM | POA: Insufficient documentation

## 2022-03-03 DIAGNOSIS — R072 Precordial pain: Secondary | ICD-10-CM

## 2022-03-03 DIAGNOSIS — R0602 Shortness of breath: Secondary | ICD-10-CM | POA: Diagnosis not present

## 2022-03-03 LAB — CBC WITH DIFFERENTIAL/PLATELET
Abs Immature Granulocytes: 0.01 10*3/uL (ref 0.00–0.07)
Basophils Absolute: 0.1 10*3/uL (ref 0.0–0.1)
Basophils Relative: 1 %
Eosinophils Absolute: 0.1 10*3/uL (ref 0.0–0.5)
Eosinophils Relative: 1 %
HCT: 37.3 % (ref 36.0–46.0)
Hemoglobin: 11.9 g/dL — ABNORMAL LOW (ref 12.0–15.0)
Immature Granulocytes: 0 %
Lymphocytes Relative: 33 %
Lymphs Abs: 2.3 10*3/uL (ref 0.7–4.0)
MCH: 26.2 pg (ref 26.0–34.0)
MCHC: 31.9 g/dL (ref 30.0–36.0)
MCV: 82 fL (ref 80.0–100.0)
Monocytes Absolute: 0.6 10*3/uL (ref 0.1–1.0)
Monocytes Relative: 8 %
Neutro Abs: 4 10*3/uL (ref 1.7–7.7)
Neutrophils Relative %: 57 %
Platelets: 250 10*3/uL (ref 150–400)
RBC: 4.55 MIL/uL (ref 3.87–5.11)
RDW: 15 % (ref 11.5–15.5)
WBC: 7 10*3/uL (ref 4.0–10.5)
nRBC: 0 % (ref 0.0–0.2)

## 2022-03-03 LAB — BASIC METABOLIC PANEL
Anion gap: 7 (ref 5–15)
BUN: 13 mg/dL (ref 6–20)
CO2: 25 mmol/L (ref 22–32)
Calcium: 8.7 mg/dL — ABNORMAL LOW (ref 8.9–10.3)
Chloride: 106 mmol/L (ref 98–111)
Creatinine, Ser: 0.87 mg/dL (ref 0.44–1.00)
GFR, Estimated: >60 mL/min (ref >60)
Glucose, Bld: 104 mg/dL — ABNORMAL HIGH (ref 70–99)
Potassium: 3 mmol/L — ABNORMAL LOW (ref 3.5–5.1)
Sodium: 138 mmol/L (ref 135–145)

## 2022-03-03 LAB — D-DIMER, QUANTITATIVE: D-Dimer, Quant: 0.41 ug/mL-FEU (ref 0.00–0.50)

## 2022-03-03 LAB — TROPONIN I (HIGH SENSITIVITY)
Troponin I (High Sensitivity): 2 ng/L (ref <18)
Troponin I (High Sensitivity): 3 ng/L (ref <18)

## 2022-03-03 MED ORDER — POTASSIUM CHLORIDE CRYS ER 20 MEQ PO TBCR: 20.0000 meq | EXTENDED_RELEASE_TABLET | Freq: Two times a day (BID) | ORAL | 0 refills | Status: DC

## 2022-03-03 MED ORDER — IOHEXOL 300 MG/ML  SOLN
100.0000 mL | Freq: Once | INTRAMUSCULAR | Status: AC | PRN
  Administered 2022-03-03: 75 mL via INTRAVENOUS

## 2022-03-03 MED ORDER — POTASSIUM CHLORIDE CRYS ER 20 MEQ PO TBCR
40.0000 meq | EXTENDED_RELEASE_TABLET | Freq: Once | ORAL | Status: AC
  Administered 2022-03-03: 40 meq via ORAL
  Filled 2022-03-03: qty 2

## 2022-03-03 NOTE — ED Provider Notes (Addendum)
[facility] Provider Note   CSN: [id number] Arrival date & time: 03/03/22  1093     History  Chief Complaint  Patient presents with   Chest Pain    Brenda Colon is a 41 y.o. female.  Patient with worsening chest pain over the past few days.  Patient seen in September 22 for some discomfort in the left arm.  That is now increased and Brenda Colon has left anterior chest pain that radiates to the back of the left shoulder area.  Still has the bilateral leg swelling 1 leg not worse than the other.  Patient's work-up on September 22 had negative troponins negative chest x-ray patient's past medical history significant for hypertension.  Patient given referral to cardiology but has not been able to follow-up with them yet.  This chest pain and left arm pain is a new finding.  The left arm pain made a little bit worse by movement at the shoulder and elbow but not necessarily.  Associated with just a hint of shortness of breath.  No nausea no vomiting       Home Medications Prior to Admission medications   Medication Sig Start Date End Date Taking? Authorizing Provider  amLODipine (NORVASC) 10 MG tablet Take 1 tablet (10 mg total) by mouth daily. 01/15/22  Yes McClung, Angela M, PA-C  omeprazole (PRILOSEC) 20 MG capsule Take 1 capsule (20 mg total) by mouth daily. 02/10/22  Yes Ladell Pier, MD  NIFEdipine (PROCARDIA-XL/ADALAT-CC/NIFEDICAL-XL) 30 MG 24 hr tablet Take by mouth once per day. 12/02/17 12/22/18  [name], MD      Allergies    Patient has no known allergies.    Review of Systems   Review of Systems  Constitutional:  Negative for chills and fever.  HENT:  Negative for ear pain and sore throat.   Eyes:  Negative for pain and visual disturbance.  Respiratory:  Positive for shortness of breath. Negative for cough.   Cardiovascular:  Positive for chest pain and leg swelling. Negative for palpitations.  Gastrointestinal:  Negative for abdominal pain and  vomiting.  Genitourinary:  Negative for dysuria and hematuria.  Musculoskeletal:  Negative for arthralgias and back pain.  Skin:  Negative for color change and rash.  Neurological:  Negative for seizures and syncope.  All other systems reviewed and are negative.   Physical Exam Updated Vital Signs BP (!) 122/94   Pulse 80   Temp 98.7 F (37.1 C) (Oral)   Resp 16   LMP 02/13/2022   SpO2 99%  Physical Exam Vitals and nursing note reviewed.  Constitutional:      General: Brenda Colon is not in acute distress.    Appearance: Brenda Colon is well-developed.  HENT:     Head: Normocephalic and atraumatic.  Eyes:     Conjunctiva/sclera: Conjunctivae normal.  Cardiovascular:     Rate and Rhythm: Normal rate and regular rhythm.     Heart sounds: No murmur heard. Pulmonary:     Effort: Pulmonary effort is normal. No respiratory distress.     Breath sounds: Normal breath sounds. No decreased breath sounds, wheezing, rhonchi or rales.  Chest:     Chest wall: No tenderness.  Abdominal:     Palpations: Abdomen is soft.     Tenderness: There is no abdominal tenderness.  Musculoskeletal:        General: No swelling.     Cervical back: Neck supple.     Right lower leg: No tenderness. Edema present.  Left lower leg: No tenderness. Edema present.  Skin:    General: Skin is warm and dry.     Capillary Refill: Capillary refill takes less than 2 seconds.  Neurological:     General: No focal deficit present.     Mental Status: Brenda Colon is alert and oriented to person, place, and time.  Psychiatric:        Mood and Affect: Mood normal.     ED Results / Procedures / Treatments   Labs (all labs ordered are listed, but only abnormal results are displayed) Labs Reviewed  CBC WITH DIFFERENTIAL/PLATELET - Abnormal; Notable for the following components:      Result Value   Hemoglobin 11.9 (*)    All other components within normal limits  BASIC METABOLIC PANEL - Abnormal; Notable for the following  components:   Potassium 3.0 (*)    Glucose, Bld 104 (*)    Calcium 8.7 (*)    All other components within normal limits  D-DIMER, QUANTITATIVE  TROPONIN I (HIGH SENSITIVITY)  TROPONIN I (HIGH SENSITIVITY)    EKG EKG Interpretation  Date/Time:  Tuesday March 03 2022 08:18:39 EDT Ventricular Rate:  97 PR Interval:  151 QRS Duration: 85 QT Interval:  348 QTC Calculation: 442 R Axis:   38 Text Interpretation: Sinus rhythm Abnormal R-wave progression, early transition Confirmed by Fredia Sorrow [phone]) on 03/03/2022 8:23:10 AM  Radiology CT Chest W Contrast  Result Date: 03/03/2022 CLINICAL DATA:  Chest pain. EXAM: CT CHEST WITH CONTRAST TECHNIQUE: Multidetector CT imaging of the chest was performed during intravenous contrast administration. RADIATION DOSE REDUCTION: This exam was performed according to the departmental dose-optimization program which includes automated exposure control, adjustment of the mA and/or kV according to patient size and/or use of iterative reconstruction technique. CONTRAST:  78m OMNIPAQUE IOHEXOL 300 MG/ML  SOLN COMPARISON:  Radiograph same day. FINDINGS: Cardiovascular: No significant vascular findings. Normal heart size. No pericardial effusion. Mediastinum/Nodes: No enlarged mediastinal, hilar, or axillary lymph nodes. Thyroid gland, trachea, and esophagus demonstrate no significant findings. Lungs/Pleura: Lungs are clear. No pleural effusion or pneumothorax. Upper Abdomen: No acute abnormality. Musculoskeletal: No chest wall abnormality. No acute or significant osseous findings. IMPRESSION: No definite abnormality seen in the chest. Electronically Signed   By: JMarijo ConceptionM.D.   On: 03/03/2022 10:42   DG Chest Port 1 View  Result Date: 03/03/2022 CLINICAL DATA:  Provided history: Chest pain. Additional history provided: Patient reports chest and back pain which radiates down left arm for 2 weeks. Worsening. EXAM: PORTABLE CHEST 1 VIEW COMPARISON:   Prior chest radiographs 02/27/2022 and earlier. FINDINGS: Heart size within normal limits. Persistent small ill-defined opacity within the left lung base. No appreciable airspace consolidation on the right. Degenerative changes of the spine. No evidence of pleural effusion or pneumothorax. Degenerative changes of the spine. IMPRESSION: Persistent small ill-defined opacity within the left lung base, which may reflect atelectasis or pneumonia. Radiographic follow-up to resolution is recommended to exclude underlying malignancy at this site. Electronically Signed   By: KKellie SimmeringD.O.   On: 03/03/2022 08:52    Procedures Procedures    Medications Ordered in ED Medications  potassium chloride SA (KLOR-CON M) CR tablet 40 mEq (40 mEq Oral Given 03/03/22 1039)  iohexol (OMNIPAQUE) 300 MG/ML solution 100 mL (75 mLs Intravenous Contrast Given 03/03/22 1016)    ED Course/ Medical Decision Making/ A&P  Medical Decision Making Amount and/or Complexity of Data Reviewed Labs: ordered. Radiology: ordered.  Risk Prescription drug management.   Patient's pain pattern somewhat concerning.  Chart review shows that he did not check D-dimer during last evaluation on September 22.  We will repeat that evaluation with chest x-ray labs troponins and will add on a D-dimer at this time.  Patient already given referral to cardiology.  Disposition based on work-up.  EKG here today with resolution of inferior Q waves.  But does have poor R wave progression.  Patient's work-up her D-dimer was normal troponins x2 normal CBC showed some mild anemia with a hemoglobin 11.9 but not significant.  No leukocytosis.  Basic metabolic panel significant for potassium of 3 patient given 40 mill colons of potassium here.  Chest x-ray raise concerns about ill-defined opacity within the left base.  CT chest was done and that was negative did not show any abnormalities in that area.  Patient needs follow-up  with cardiology.  We will have patient take some potassium at home for few days.  Final Clinical Impression(s) / ED Diagnoses Final diagnoses:  Precordial pain  Hypokalemia    Rx / DC Orders ED Discharge Orders     None         Fredia Sorrow, MD 03/03/22 0354    Fredia Sorrow, MD 03/03/22 1334

## 2022-03-03 NOTE — ED Triage Notes (Signed)
Patient POV from home with complaints of intermittent chest pain with radiation down left arm for the past two weeks. States that pain is 10/10 this am with radiation across chest. Has tried OTC pain patches for muscle pain with no relief.

## 2022-03-03 NOTE — Discharge Instructions (Addendum)
Acute appointment to follow-up with cardiology today's work-up again reassuring.  The nodule seen on the chest x-ray was not present on the CT scan of the chest which is very reassuring and means that there is really nothing there.  But follow-up with cardiology is important.  Work note provided.  Return for any new or worse symptoms.

## 2022-03-04 ENCOUNTER — Telehealth (HOSPITAL_COMMUNITY): Payer: Self-pay | Admitting: Emergency Medicine

## 2022-03-04 MED ORDER — POTASSIUM CHLORIDE CRYS ER 20 MEQ PO TBCR: 20.0000 meq | EXTENDED_RELEASE_TABLET | Freq: Two times a day (BID) | ORAL | 0 refills | Status: DC

## 2022-03-04 NOTE — Telephone Encounter (Signed)
Call from patient stating her pharmacy does not have a prescription for the potassium and is asking that we prescribe it to them.  This was completed here.

## 2022-03-12 ENCOUNTER — Telehealth: Payer: Self-pay | Admitting: Emergency Medicine

## 2022-03-12 NOTE — Telephone Encounter (Signed)
Copied from [place] [id number]. Topic: General - Other >> Mar 12, 2022 10:33 AM Everette C wrote: Reason for CRM: The patient has called to request orders for an MRI as well as direct contact with their PCP regarding their additional concerns   The patient would like to be contacted when the orders are created  Please contact further when possible

## 2022-03-12 NOTE — Telephone Encounter (Signed)
Please advise.----DD,RMA

## 2022-03-18 NOTE — Telephone Encounter (Signed)
Pt expressed understanding. Pt states that she already has appt scheduled this Friday 10/13 w/ pcp. Informed pt to keep appt. Pt confirmed she will be attending upcoming appt. ----DD,RMA

## 2022-03-20 ENCOUNTER — Other Ambulatory Visit (HOSPITAL_COMMUNITY)
Admission: RE | Admit: 2022-03-20 | Discharge: 2022-03-20 | Disposition: A | Discharge status: Home / Self Care | Payer: Medicaid Other | Source: Ambulatory Visit | Attending: Internal Medicine | Admitting: Internal Medicine

## 2022-03-20 ENCOUNTER — Encounter: Payer: Self-pay | Admitting: Internal Medicine

## 2022-03-20 ENCOUNTER — Ambulatory Visit: Payer: Medicaid Other | Attending: Internal Medicine | Admitting: Internal Medicine

## 2022-03-20 VITALS — BP 126/92 | HR 86 | Temp 98.2°F | Ht 66.0 in | Wt 218.6 lb

## 2022-03-20 DIAGNOSIS — Z124 Encounter for screening for malignant neoplasm of cervix: Secondary | ICD-10-CM | POA: Diagnosis not present

## 2022-03-20 DIAGNOSIS — Z1231 Encounter for screening mammogram for malignant neoplasm of breast: Secondary | ICD-10-CM | POA: Diagnosis not present

## 2022-03-20 DIAGNOSIS — E876 Hypokalemia: Secondary | ICD-10-CM | POA: Diagnosis not present

## 2022-03-20 DIAGNOSIS — M5412 Radiculopathy, cervical region: Secondary | ICD-10-CM | POA: Diagnosis not present

## 2022-03-20 DIAGNOSIS — R600 Localized edema: Secondary | ICD-10-CM

## 2022-03-20 DIAGNOSIS — I10 Essential (primary) hypertension: Secondary | ICD-10-CM

## 2022-03-20 MED ORDER — GABAPENTIN 100 MG PO CAPS: 200.0000 mg | ORAL_CAPSULE | Freq: Every day | ORAL | 2 refills | Status: DC

## 2022-03-20 MED ORDER — SPIRONOLACTONE 25 MG PO TABS: 12.5000 mg | ORAL_TABLET | Freq: Every day | ORAL | 3 refills | Status: DC

## 2022-03-20 MED ORDER — AMLODIPINE BESYLATE 5 MG PO TABS: 5.0000 mg | ORAL_TABLET | Freq: Every day | ORAL | 3 refills | Status: DC

## 2022-03-20 MED ORDER — TRAMADOL HCL 50 MG PO TABS: 50.0000 mg | ORAL_TABLET | Freq: Three times a day (TID) | ORAL | 0 refills | Status: DC | PRN

## 2022-03-20 NOTE — Progress Notes (Signed)
Patient ID: Brenda Colon, female    DOB: November 09, 1980  MRN: [id number]  CC: Gynecologic Exam (Pap. Constant pain on L shoulder X1 mo, numb fingers. Interested in MRI and meds for pain.)   Subjective: Brenda Colon is a 41 y.o. female who presents for Pap smear and f/u BP Her concerns today include:  Pt with hx of HTN, PreDM, obesity  GYN History:  Pt is G4P1 (1 abortion, 2 miscarriage) Any hx of abn paps?: no Menses regular or irregular?:  regular How long does menses last? 4 days Menstrual flow light or heavy?:  light to moderate Method of birth control?:  no Any vaginal dischg at this time?: no Dysuria?: no Any hx of STI?:  no Sexually active with how many partners: not currently Desires STI screen: yes Last MMG: never.  Would like to start screening now Family hx of uterine, cervical or breast cancer?:  no  HTN: taking Norvasc 10 mg daily Has not check BP lately but DBP tends to be high Limits salt in foods. She has been having some swelling in both legs for which she was seen in the emergency room.  Told she had some venous insufficiency.  Potassium level noted to run low.  She was given prescription for potassium supplement to take for 4 days after her last ER visit.  Patient states that she was only able to get 1 or 2 down.  Complains of pain in the neck that radiates down the left arm.   Started with pain LT side of neck and around the shoulder blade mid Sept.  Thought she slept wrong at first. Several days later pain started radiating down the left arm.  Associated with constant numbness in the left index finger and intermittent numbness in the thumb and middle finger.  No associated weakness of grip or weakness in the arm.  Pain in the neck is worse with rotation of the head to the left and with forward motion.  Pain in the neck and arm is worse at nights.  She describes it as throbbing. She has been taking and alternating ibuprofen 800 mg with Tylenol 650 mg.  No relief  with this.  She works at the hospital.  She ran into the Market researcher recently and informed him of the symptoms that she was having.  He told her that she likely has a pinched nerve and had prescribed prednisone 40 mg for 5 days.  She has completed that without relief. Seen in ER 02/27/2022 for the left arm pain.  Work-up included chest x-ray that showed questionable left lower lobe infiltrate. Seen again 9.26.23 because LT upper chest pain with pain LT arm.  Cardiac work-up was negative.  CT of the chest was negative for any acute findings. Referred to cardiologist.  Has upcoming appointment. Pain in LT arm constant.  Index finger constantly numb, goes and comes in thumb and middle   Patient Active Problem List   Diagnosis Date Noted   Morbid obesity ([place]) 02/10/2022   GERD without esophagitis 02/10/2022   Prediabetes 02/10/2022   Hyperlipemia, mixed 11/30/2020   Esophageal dysphagia 11/29/2020   Essential hypertension 11/29/2020   Intrauterine pregnancy 08/21/2020   Pregnancy with uncertain fetal viability 08/21/2020   Chronic hypertension affecting pregnancy 12/30/2017   Family history of microcephaly 09/14/2017   Fibroid 08/15/2017   Chronic hypertension during pregnancy, antepartum 03/26/2017   GERD with esophagitis 03/26/2017     Current Outpatient Medications on File Prior to Visit  Medication Sig Dispense Refill   amLODipine (NORVASC) 10 MG tablet Take 1 tablet (10 mg total) by mouth daily. 90 tablet 1   omeprazole (PRILOSEC) 20 MG capsule Take 1 capsule (20 mg total) by mouth daily. 30 capsule 3   potassium chloride SA (KLOR-CON M) 20 MEQ tablet Take 1 tablet (20 mEq total) by mouth 2 (two) times daily. (Patient not taking: Reported on 03/20/2022) 4 tablet 0   potassium chloride SA (KLOR-CON M) 20 MEQ tablet Take 1 tablet (20 mEq total) by mouth 2 (two) times daily. (Patient not taking: Reported on 03/20/2022) 4 tablet 0   [DISCONTINUED] NIFEdipine  (PROCARDIA-XL/ADALAT-CC/NIFEDICAL-XL) 30 MG 24 hr tablet Take by mouth once per day. 30 tablet 2   No current facility-administered medications on file prior to visit.    No Known Allergies  Social History   Socioeconomic History   Marital status: Single    Spouse name: Not on file   Number of children: Not on file   Years of education: Not on file   Highest education level: Not on file  Occupational History   Not on file  Tobacco Use   Smoking status: Never   Smokeless tobacco: Never  Vaping Use   Vaping Use: Never used  Substance and Sexual Activity   Alcohol use: No   Drug use: No   Sexual activity: Not Currently    Partners: Male    Birth control/protection: None    Comment: 1st intercourse 12 yo-5 partners  Other Topics Concern   Not on file  Social History Narrative   Not on file   Social Determinants of Health   Financial Resource Strain: Not on file  Food Insecurity: Not on file  Transportation Needs: Not on file  Physical Activity: Not on file  Stress: Not on file  Social Connections: Not on file  Intimate Partner Violence: Not on file    Family History  Problem Relation Age of Onset   Diabetes Mother    Diabetes Father    Lupus Sister     Past Surgical History:  Procedure Laterality Date   CYSTECTOMY     Wrist   INDUCED ABORTION      ROS: Review of Systems Negative except as stated above  PHYSICAL EXAM: BP (!) 126/92 (BP Location: Left Arm, Patient Position: Sitting, Cuff Size: Large)   Pulse 86   Temp 98.2 F (36.8 C) (Oral)   Ht '5\' 6"'$  (1.676 m)   Wt 218 lb 9.6 oz (99.2 kg)   LMP 02/13/2022   SpO2 99%   BMI 35.28 kg/m   Physical Exam  General appearance - alert, well appearing, and in no distress Mental status - normal mood, behavior, speech, dress, motor activity, and thought processes Pelvic -CMA Carly Warner Mccreedy is present: Normal external genitalia, vulva, vagina, cervix, uterus and adnexa Neurological -grip 5/5 bilaterally.   Power in upper extremities proximally and distally 5/5 bilaterally.  Decreased gross sensation in the left index finger and along the left arm compared to the right. Extremities: Trace to 1+ bilateral lower extremity edema.  She is wearing compression socks.     Latest Ref Rng & Units 03/03/2022    8:34 AM 02/27/2022    2:15 PM 01/15/2022    2:48 PM  CMP  Glucose 70 - 99 mg/dL 104  88  73   BUN 6 - 20 mg/dL '13  9  11   '$ Creatinine 0.44 - 1.00 mg/dL 0.87  0.92  0.91   Sodium  135 - 145 mmol/L 138  139  139   Potassium 3.5 - 5.1 mmol/L 3.0  3.5  3.7   Chloride 98 - 111 mmol/L 106  104  101   CO2 22 - 32 mmol/L '25  26  22   '$ Calcium 8.9 - 10.3 mg/dL 8.7  8.9  9.3   Total Protein 6.0 - 8.5 g/dL   7.6   Total Bilirubin 0.0 - 1.2 mg/dL   0.3   Alkaline Phos 44 - 121 IU/L   102   AST 0 - 40 IU/L   12   ALT 0 - 32 IU/L   8    Lipid Panel     Component Value Date/Time   CHOL 206 (H) 11/29/2020 0935   TRIG 85 11/29/2020 0935   HDL 43 11/29/2020 0935   CHOLHDL 4.8 (H) 11/29/2020 0935   LDLCALC 148 (H) 11/29/2020 0935    CBC    Component Value Date/Time   WBC 7.0 03/03/2022 0834   RBC 4.55 03/03/2022 0834   HGB 11.9 (L) 03/03/2022 0834   HGB 13.3 01/15/2022 1448   HCT 37.3 03/03/2022 0834   HCT 41.4 01/15/2022 1448   PLT 250 03/03/2022 0834   PLT 297 01/15/2022 1448   MCV 82.0 03/03/2022 0834   MCV 81 01/15/2022 1448   MCH 26.2 03/03/2022 0834   MCHC 31.9 03/03/2022 0834   RDW 15.0 03/03/2022 0834   RDW 14.3 01/15/2022 1448   LYMPHSABS 2.3 03/03/2022 0834   LYMPHSABS 2.8 01/15/2022 1448   MONOABS 0.6 03/03/2022 0834   EOSABS 0.1 03/03/2022 0834   EOSABS 0.1 01/15/2022 1448   BASOSABS 0.1 03/03/2022 0834   BASOSABS 0.0 01/15/2022 1448    ASSESSMENT AND PLAN: 1. Pap smear for cervical cancer screening - Cervicovaginal ancillary only - Cytology - PAP  2. Cervical radiculopathy I think she may have a slipped disc or nerve compression. We will get an MRI of the cervical  spine. Start gabapentin to take at bedtime. We will give a short course of tramadol to use as needed.  Advised that the medicine is a narcotic medication and can cause some drowsiness.  Advised not to take when she has to drive or operate any machinery. - MR Cervical Spine Wo Contrast; Future - gabapentin (NEURONTIN) 100 MG capsule; Take 2 capsules (200 mg total) by mouth at bedtime.  Dispense: 60 capsule; Refill: 2 - traMADol (ULTRAM) 50 MG tablet; Take 1 tablet (50 mg total) by mouth every 8 (eight) hours as needed.  Dispense: 30 tablet; Refill: 0  3. Edema of both legs Norvasc can be contributing.  See #5 below.  4. Hypokalemia - Basic Metabolic Panel; Future  5. Essential hypertension I recommend decreasing Norvasc from 10 mg daily to 5 mg daily.  Add spironolactone instead given that her potassium level tends to run a bit low.  Follow-up with clinical pharmacist for repeat blood pressure check in several weeks. - spironolactone (ALDACTONE) 25 MG tablet; Take 0.5 tablets (12.5 mg total) by mouth daily.  Dispense: 30 tablet; Refill: 3 - amLODipine (NORVASC) 5 MG tablet; Take 1 tablet (5 mg total) by mouth daily.  Dispense: 30 tablet; Refill: 3  6. Encounter for screening mammogram for malignant neoplasm of breast Discussed mammogram screening for breast cancer.  Advised that some organizations recommend screening starting in the 75s while others recommend starting at age 44.  May be increase risks of false positive mammograms in the 35s as breast tends to be more  dense.  Patient has decided to start screening now. - MM Digital Screening; Future    Patient was given the opportunity to ask questions.  Patient verbalized understanding of the plan and was able to repeat key elements of the plan.   This documentation was completed using Radio producer.  Any transcriptional errors are unintentional.  No orders of the defined types were placed in this  encounter.    Requested Prescriptions    No prescriptions requested or ordered in this encounter    No follow-ups on file.  Karle Plumber, MD, FACP

## 2022-03-24 ENCOUNTER — Encounter: Payer: Self-pay | Admitting: Internal Medicine

## 2022-03-24 LAB — CERVICOVAGINAL ANCILLARY ONLY
Bacterial Vaginitis (gardnerella): NEGATIVE
Candida Glabrata: NEGATIVE
Candida Vaginitis: NEGATIVE
Chlamydia: NEGATIVE
Comment: NEGATIVE
Comment: NEGATIVE
Comment: NEGATIVE
Comment: NEGATIVE
Comment: NEGATIVE
Comment: NORMAL
Neisseria Gonorrhea: NEGATIVE
Trichomonas: NEGATIVE

## 2022-03-24 LAB — CYTOLOGY - PAP
Comment: NEGATIVE
Diagnosis: NEGATIVE
High risk HPV: NEGATIVE

## 2022-03-24 NOTE — Progress Notes (Deleted)
Cardiology Office Note:    Date:  03/24/2022   ID:  Brenda Colon, DOB Apr 07, 1981, MRN [id number]  PCP:  Ladell Pier, MD   [facility] Providers Cardiologist:  None {  Referring MD: Mickie Hillier, PA-C    History of Present Illness:    Brenda Colon is a 41 y.o. female with a hx of HTN, prediabetes and obesity who was referred by Theodis Blaze, PA-C for further evaluation of left arm pain.  Patient was seen in the ER on 02/21/22 and 02/27/22 for left arm pain initially and left chest pain on the subsequent date. Notes reviewed. Work-up on both occasions with normal trops x2, d-dimer negative. ECG with NSR with poor r-wave progression. CT chest unremarkable. She was discharged home on both occasions with CV follow-up.  Today, ***   Past Medical History:  Diagnosis Date   Hypertension    STD (sexually transmitted disease)    Gonorrhea,Trich   Uterine fibroid     Past Surgical History:  Procedure Laterality Date   CYSTECTOMY     Wrist   INDUCED ABORTION      Current Medications: No outpatient medications have been marked as taking for the 03/27/22 encounter (Appointment) with Freada Bergeron, MD.     Allergies:   Patient has no known allergies.   Social History   Socioeconomic History   Marital status: Single    Spouse name: Not on file   Number of children: Not on file   Years of education: Not on file   Highest education level: Not on file  Occupational History   Not on file  Tobacco Use   Smoking status: Never   Smokeless tobacco: Never  Vaping Use   Vaping Use: Never used  Substance and Sexual Activity   Alcohol use: No   Drug use: No   Sexual activity: Not Currently    Partners: Male    Birth control/protection: None    Comment: 1st intercourse 11 yo-5 partners  Other Topics Concern   Not on file  Social History Narrative   Not on file   Social Determinants of Health   Financial Resource Strain: Not on file  Food  Insecurity: Not on file  Transportation Needs: Not on file  Physical Activity: Not on file  Stress: Not on file  Social Connections: Not on file     Family History: The patient's ***family history includes Diabetes in her father and mother; Lupus in her sister.  ROS:   Please see the history of present illness.    *** All other systems reviewed and are negative.  EKGs/Labs/Other Studies Reviewed:    The following studies were reviewed today: CT 03/03/22: FINDINGS: Cardiovascular: No significant vascular findings. Normal heart size. No pericardial effusion.   Mediastinum/Nodes: No enlarged mediastinal, hilar, or axillary lymph nodes. Thyroid gland, trachea, and esophagus demonstrate no significant findings.   Lungs/Pleura: Lungs are clear. No pleural effusion or pneumothorax.   Upper Abdomen: No acute abnormality.   Musculoskeletal: No chest wall abnormality. No acute or significant osseous findings.   IMPRESSION: No definite abnormality seen in the chest.  EKG:  EKG is *** ordered today.  The ekg ordered today demonstrates ***  Recent Labs: 01/15/2022: ALT 8 02/27/2022: B Natriuretic Peptide 16.0 03/03/2022: BUN 13; Creatinine, Ser 0.87; Hemoglobin 11.9; Platelets 250; Potassium 3.0; Sodium 138  Recent Lipid Panel    Component Value Date/Time   CHOL 206 (H) 11/29/2020 0935   TRIG 85 11/29/2020  0935   HDL 43 11/29/2020 0935   CHOLHDL 4.8 (H) 11/29/2020 0935   LDLCALC 148 (H) 11/29/2020 0935     Risk Assessment/Calculations:   {Does this patient have ATRIAL FIBRILLATION?[phone]}  No BP recorded.  {Refresh Note OR Click here to enter BP  :1}***         Physical Exam:    VS:  LMP 02/13/2022     Wt Readings from Last 3 Encounters:  03/20/22 218 lb 9.6 oz (99.2 kg)  02/27/22 260 lb (117.9 kg)  02/10/22 282 lb 3.2 oz (128 kg)     GEN: *** Well nourished, well developed in no acute distress HEENT: Normal NECK: No JVD; No carotid bruits LYMPHATICS: No  lymphadenopathy CARDIAC: ***RRR, no murmurs, rubs, gallops RESPIRATORY:  Clear to auscultation without rales, wheezing or rhonchi  ABDOMEN: Soft, non-tender, non-distended MUSCULOSKELETAL:  No edema; No deformity  SKIN: Warm and dry NEUROLOGIC:  Alert and oriented x 3 PSYCHIATRIC:  Normal affect   ASSESSMENT:    No diagnosis found. PLAN:    In order of problems listed above:  #Chest Pain: Atypical in nature. Trops negative and ECG nonischemic. Given risk factors and symptoms, however, will assess further with coronary CTA. -Check coronary CTA  #HTN: -Continue amlodipine '5mg'$  daily -Continue spironolactone '25mg'$  daily      {Are you ordering a CV Procedure (e.g. stress test, cath, DCCV, TEE, etc)?   Press F2        :[id number]}    Medication Adjustments/Labs and Tests Ordered: Current medicines are reviewed at length with the patient today.  Concerns regarding medicines are outlined above.  No orders of the defined types were placed in this encounter.  No orders of the defined types were placed in this encounter.   There are no Patient Instructions on file for this visit.   Signed, Freada Bergeron, MD  03/24/2022 2:10 PM    [place]

## 2022-03-25 ENCOUNTER — Telehealth: Payer: Self-pay | Admitting: Internal Medicine

## 2022-03-26 NOTE — Telephone Encounter (Signed)
Pt called and informed that letter is ready for pick up. She has been informed that GI will reach out to her to schedule appointment.

## 2022-03-27 ENCOUNTER — Ambulatory Visit: Payer: Medicaid Other | Attending: Cardiology | Admitting: Cardiology

## 2022-03-27 ENCOUNTER — Encounter: Payer: Self-pay | Admitting: Cardiology

## 2022-03-27 VITALS — BP 120/82 | HR 98 | Ht 66.0 in | Wt 281.0 lb

## 2022-03-27 DIAGNOSIS — M79602 Pain in left arm: Secondary | ICD-10-CM

## 2022-03-27 DIAGNOSIS — Z79899 Other long term (current) drug therapy: Secondary | ICD-10-CM | POA: Diagnosis not present

## 2022-03-27 DIAGNOSIS — I10 Essential (primary) hypertension: Secondary | ICD-10-CM | POA: Diagnosis not present

## 2022-03-27 DIAGNOSIS — R600 Localized edema: Secondary | ICD-10-CM

## 2022-03-27 MED ORDER — VALSARTAN 80 MG PO TABS: 80.0000 mg | ORAL_TABLET | Freq: Every day | ORAL | 1 refills | Status: DC

## 2022-03-27 NOTE — Progress Notes (Signed)
Cardiology Office Note:    Date:  03/27/2022   ID:  Brenda Colon, DOB Nov 12, 1980, MRN [id number]  PCP:  Ladell Pier, MD   [facility] Providers Cardiologist:  None {  Referring MD: Mickie Hillier, PA-C    History of Present Illness:    Brenda Colon is a 41 y.o. female with a hx of HTN, prediabetes and obesity who was referred by Theodis Blaze, PA-C for further evaluation of left arm pain.  Patient was seen in the ER on 02/21/22 and 02/27/22 for left arm pain initially and left chest pain on the subsequent date. Notes reviewed. Work-up on both occasions with normal trops x2, d-dimer negative. ECG with NSR with poor r-wave progression. CT chest unremarkable. She was discharged home on both occasions with CV follow-up.  Today, patient states that she has been experiencing pain on her left shoulder. She noticed this when she woke up and thought it was due to sleeping in a uncomfortable position. Her shoulder pain radiated distally to her left arm. She has tried taking Ibuprofen and Excedrin but these did not relieve her pain. She notes that her left shoulder pain is constant, and it has progressed to numbness in her left arm and fingers. Her 1st left finger stays numb constantly; her 2nd and 3rd left fingers feel slightly numb. This has been ongoing for weeks. She was given gabapentin as well as a steroid pack. Her pain got slightly better but still persists. She is scheduled for a MRI for further evaluation.  She also has intermittent LE edema. Her amlodipine was decreased and spiro was started but symptoms have persisted. Symptoms seem to worsen with prolonged standing but do not resolve completely with leg elevation. She denies any associated orthopnea, SOB or PND.  She denies any palpitations, chest pain, shortness of breath, lightheadedness, headaches, syncope, orthopnea, or PND.    Past Medical History:  Diagnosis Date   Hypertension    STD (sexually transmitted  disease)    Gonorrhea,Trich   Uterine fibroid     Past Surgical History:  Procedure Laterality Date   CYSTECTOMY     Wrist   INDUCED ABORTION      Current Medications: Current Meds  Medication Sig   gabapentin (NEURONTIN) 100 MG capsule Take 2 capsules (200 mg total) by mouth at bedtime.   omeprazole (PRILOSEC) 20 MG capsule Take 1 capsule (20 mg total) by mouth daily.   spironolactone (ALDACTONE) 25 MG tablet Take 0.5 tablets (12.5 mg total) by mouth daily.   traMADol (ULTRAM) 50 MG tablet Take 1 tablet (50 mg total) by mouth every 8 (eight) hours as needed.   valsartan (DIOVAN) 80 MG tablet Take 1 tablet (80 mg total) by mouth daily.   [DISCONTINUED] amLODipine (NORVASC) 5 MG tablet Take 1 tablet (5 mg total) by mouth daily.     Allergies:   Patient has no known allergies.   Social History   Socioeconomic History   Marital status: Single    Spouse name: Not on file   Number of children: Not on file   Years of education: Not on file   Highest education level: Not on file  Occupational History   Not on file  Tobacco Use   Smoking status: Never   Smokeless tobacco: Never  Vaping Use   Vaping Use: Never used  Substance and Sexual Activity   Alcohol use: No   Drug use: No   Sexual activity: Not Currently  Partners: Male    Birth control/protection: None    Comment: 1st intercourse 6 yo-5 partners  Other Topics Concern   Not on file  Social History Narrative   Not on file   Social Determinants of Health   Financial Resource Strain: Not on file  Food Insecurity: Not on file  Transportation Needs: Not on file  Physical Activity: Not on file  Stress: Not on file  Social Connections: Not on file     Family History: The patient's family history includes Diabetes in her father and mother; Lupus in her sister.  ROS:   Review of Systems  Constitutional:  Negative for fever and weight loss.  HENT:  Negative for ear pain and hearing loss.   Eyes:  Negative  for blurred vision and photophobia.  Respiratory:  Negative for cough and sputum production.   Cardiovascular:  Positive for leg swelling. Negative for chest pain, palpitations, orthopnea, claudication and PND.  Gastrointestinal:  Negative for abdominal pain and heartburn.  Genitourinary:  Positive for frequency. Negative for dysuria and urgency.  Musculoskeletal:  Positive for joint pain. Negative for back pain and myalgias.  Neurological:  Positive for sensory change (Numbness of left arm, left fingers). Negative for dizziness and tremors.  Psychiatric/Behavioral:  Negative for depression and substance abuse.      EKGs/Labs/Other Studies Reviewed:    The following studies were reviewed today:  CT Chest 03/03/22: FINDINGS: Cardiovascular: No significant vascular findings. Normal heart size. No pericardial effusion.   Mediastinum/Nodes: No enlarged mediastinal, hilar, or axillary lymph nodes. Thyroid gland, trachea, and esophagus demonstrate no significant findings.   Lungs/Pleura: Lungs are clear. No pleural effusion or pneumothorax.   Upper Abdomen: No acute abnormality.   Musculoskeletal: No chest wall abnormality. No acute or significant osseous findings.   IMPRESSION: No definite abnormality seen in the chest.  EKG:  EKG is personally reviewed. 03/27/22: EKG was not ordered. 03/04/22 (ED): Sinus rhythm Abnormal R-wave progression, early transition, HR: 97 bpm  Recent Labs: 01/15/2022: ALT 8 02/27/2022: B Natriuretic Peptide 16.0 03/03/2022: BUN 13; Creatinine, Ser 0.87; Hemoglobin 11.9; Platelets 250; Potassium 3.0; Sodium 138  Recent Lipid Panel    Component Value Date/Time   CHOL 206 (H) 11/29/2020 0935   TRIG 85 11/29/2020 0935   HDL 43 11/29/2020 0935   CHOLHDL 4.8 (H) 11/29/2020 0935   LDLCALC 148 (H) 11/29/2020 0935     Risk Assessment/Calculations:           Physical Exam:    VS:  BP 120/82   Pulse 98   Ht '5\' 6"'$  (1.676 m)   Wt 281 lb (127.5 kg)    LMP 02/13/2022   SpO2 94%   BMI 45.35 kg/m     Wt Readings from Last 3 Encounters:  03/27/22 281 lb (127.5 kg)  03/20/22 218 lb 9.6 oz (99.2 kg)  02/27/22 260 lb (117.9 kg)     GEN: Well nourished, well developed in no acute distress HEENT: Normal NECK: No JVD; No carotid bruits CARDIAC: RRR, 1/6 systolic murmur, no rubs, no gallops RESPIRATORY:  Clear to auscultation without rales, wheezing or rhonchi  ABDOMEN: Soft, non-tender, non-distended MUSCULOSKELETAL:  1+ LE edema bilaterally; No deformity  SKIN: Warm and dry NEUROLOGIC:  Alert and oriented x 3 PSYCHIATRIC:  Normal affect   ASSESSMENT:    1. Leg edema   2. Left arm pain   3. Essential hypertension   4. Medication management    PLAN:    In order of problems  listed above:  #Left Arm Pain/Numbness: Has been ongoing for several weeks and is constant in nature. Agree that this is not cardiac and more concerning for nerve compression. She is scheduled for MRI per PCP.   #LE Edema: Has 1+ LE edema on exam today. No orthopnea, PND, palpitations or chest pain. No known history of HF and BP is controlled. Suspect this may be related to amlodipine but will check BNP and echo to evaluate further. -Stop amlodipine -Start valsartan '80mg'$  daily -Check TTE -Check BNP today  #HTN: Controlled but now with LE edema. ? Related to amlodipine. Will adjust as below. -Change amlodipine to valsartan '80mg'$  daily -Continue spiro 12.'5mg'$  daily -BMET scheduled with PCP next week  #HypoK: K 3.0 in ER. Will repeat BMET for monitoring. -Check BMET         Follow-up: 27-month   Medication Adjustments/Labs and Tests Ordered: Current medicines are reviewed at length with the patient today.  Concerns regarding medicines are outlined above.  Orders Placed This Encounter  Procedures   Basic metabolic panel   Pro b natriuretic peptide   ECHOCARDIOGRAM COMPLETE   Meds ordered this encounter  Medications   valsartan (DIOVAN) 80 MG  tablet    Sig: Take 1 tablet (80 mg total) by mouth daily.    Dispense:  90 tablet    Refill:  1    Patient Instructions  Medication Instructions:   STOP TAKING AMLODIPINE NOW  START TAKING VALSARTAN 80 MG BY MOUTH DAILY  *If you need a refill on your cardiac medications before your next appointment, please call your pharmacy*   Lab Work:  TODAY--BMET AND PRO-BNP   PLEASE HAVE YOUR PCP FAX OUR OFFICE YOUR OTHER LAB RESULTS WHEN COMPLETE AT [phone] If you have labs (blood work) drawn today and your tests are completely normal, you will receive your results only by: [place](if you have MyChart) OR A paper copy in the mail If you have any lab test that is abnormal or we need to change your treatment, we will call you to review the results.   Testing/Procedures:  Your physician has requested that you have an echocardiogram. Echocardiography is a painless test that uses sound waves to create images of your heart. It provides your doctor with information about the size and shape of your heart and how well your heart's chambers and valves are working. This procedure takes approximately one hour. There are no restrictions for this procedure. Please do NOT wear cologne, perfume, aftershave, or lotions (deodorant is allowed). Please arrive 15 minutes prior to your appointment time.    Follow-Up:  3 MONTHS WITH AN EXTENDER IN THE OFFICE    Important Information About Sugar         I,Danny Valdes,acting as a scribe for HFreada Bergeron MD.,have documented all relevant documentation on the behalf of HFreada Bergeron MD,as directed by  HFreada Bergeron MD while in the presence of HFreada Bergeron MD.  I, HFreada Bergeron MD, have reviewed all documentation for this visit. The documentation on 03/27/22 for the exam, diagnosis, procedures, and orders are all accurate and complete.   Signed, HFreada Bergeron MD  03/27/2022 4:30 PM     [place]

## 2022-03-27 NOTE — Progress Notes (Deleted)
#  Left Arm Pain/Numbness: Has been ongoing for several weeks and is constant in nature. Agree that this is not cardiac and more concerning for nerve compression. She is scheduled for MRI per PCP.   #LE Edema: Has 1+ LE edema on exam today. No orthopnea, PND, palpitations or chest pain. No known history of HF and BP is controlled. Suspect this may be related to amlodipine but will check BNP and echo to evaluate further. -Stop amlodipine -Start valsartan '80mg'$  daily -Check TTE -Check BNP today  #HTN: -Change amlodipine to valsartan '80mg'$  daily -Continue spiro 12.'5mg'$  daily -BMET scheduled with PCP next week  #HypoK: K 3.0 in ER. Will repeat BMET for monitoring. -Check BMET

## 2022-03-27 NOTE — Patient Instructions (Signed)
Medication Instructions:   STOP TAKING AMLODIPINE NOW  START TAKING VALSARTAN 80 MG BY MOUTH DAILY  *If you need a refill on your cardiac medications before your next appointment, please call your pharmacy*   Lab Work:  TODAY--BMET AND PRO-BNP   PLEASE HAVE YOUR PCP FAX OUR OFFICE YOUR OTHER LAB RESULTS WHEN COMPLETE AT [phone]  If you have labs (blood work) drawn today and your tests are completely normal, you will receive your results only by: [place] (if you have MyChart) OR A paper copy in the mail If you have any lab test that is abnormal or we need to change your treatment, we will call you to review the results.   Testing/Procedures:  Your physician has requested that you have an echocardiogram. Echocardiography is a painless test that uses sound waves to create images of your heart. It provides your doctor with information about the size and shape of your heart and how well your heart's chambers and valves are working. This procedure takes approximately one hour. There are no restrictions for this procedure. Please do NOT wear cologne, perfume, aftershave, or lotions (deodorant is allowed). Please arrive 15 minutes prior to your appointment time.    Follow-Up:  3 MONTHS WITH AN EXTENDER IN THE OFFICE    Important Information About Sugar

## 2022-03-28 LAB — BASIC METABOLIC PANEL
BUN/Creatinine Ratio: 12 (ref 9–23)
BUN: 10 mg/dL (ref 6–24)
CO2: 22 mmol/L (ref 20–29)
Calcium: 9.4 mg/dL (ref 8.7–10.2)
Chloride: 103 mmol/L (ref 96–106)
Creatinine, Ser: 0.86 mg/dL (ref 0.57–1.00)
Glucose: 112 mg/dL — ABNORMAL HIGH (ref 70–99)
Potassium: 3.9 mmol/L (ref 3.5–5.2)
Sodium: 139 mmol/L (ref 134–144)
eGFR: 86 mL/min/{1.73_m2} (ref >59)

## 2022-03-28 LAB — PRO B NATRIURETIC PEPTIDE: NT-Pro BNP: <36 pg/mL (ref 0–130)

## 2022-04-04 ENCOUNTER — Other Ambulatory Visit: Payer: Medicaid Other

## 2022-04-13 ENCOUNTER — Other Ambulatory Visit (HOSPITAL_COMMUNITY): Payer: Medicaid Other

## 2022-04-22 ENCOUNTER — Ambulatory Visit
Admission: RE | Admit: 2022-04-22 | Discharge: 2022-04-22 | Disposition: A | Discharge status: Home / Self Care | Payer: Medicaid Other | Source: Ambulatory Visit | Attending: Internal Medicine | Admitting: Internal Medicine

## 2022-04-22 DIAGNOSIS — Z1231 Encounter for screening mammogram for malignant neoplasm of breast: Secondary | ICD-10-CM | POA: Diagnosis not present

## 2022-04-28 ENCOUNTER — Ambulatory Visit: Payer: Medicaid Other | Admitting: Pharmacist

## 2022-04-28 NOTE — Progress Notes (Deleted)
   S:     No chief complaint on file.  41 y.o. female who presents for hypertension evaluation, education, and management.  PMH is significant for HTN, GERD, HLD, morbid obesity, preDM.  Patient was referred and last seen by Primary Care Provider, Dr. Wynetta Emery, on 03/20/2022. At that visit, BP was 126/92 mmHg. Of note, she was noted to have LE swelling. We decreased amlodipine dose to 5 mg daily. Additionally, we started spironolactone as she has a hx of hypokalemia. She was seen by a Cardiologist subsequently and BP at that visit was 120/82 mmHg. Of note, her potassium level was normal but her LE edema persisted. Amlodipine was changed to valsartan.   Today, patient arrives in *** spirits and presents without *** assistance. *** Denies dizziness, headache, blurred vision, swelling.   Patient reports hypertension was diagnosed in ***.   Family/Social history: ***  Medication adherence *** . Patient has *** taken BP medications today.   Current antihypertensives include: ***  Antihypertensives tried in the past include: ***  Reported home BP readings: ***  Patient reported dietary habits: Eats *** meals/day Breakfast: *** Lunch: *** Dinner: *** Snacks: *** Drinks: ***  Patient-reported exercise habits: ***  ASCVD risk factors include: ***  O:  ROS  Physical Exam  Last 3 Office BP readings: BP Readings from Last 3 Encounters:  03/27/22 120/82  03/20/22 (!) 126/92  03/03/22 (!) 118/103    BMET    Component Value Date/Time   NA 139 03/27/2022 1546   K 3.9 03/27/2022 1546   CL 103 03/27/2022 1546   CO2 22 03/27/2022 1546   GLUCOSE 112 (H) 03/27/2022 1546   GLUCOSE 104 (H) 03/03/2022 0834   BUN 10 03/27/2022 1546   CREATININE 0.86 03/27/2022 1546   CALCIUM 9.4 03/27/2022 1546   GFRNONAA >60 03/03/2022 0834   GFRAA 101 03/07/2020 0919    Renal function: CrCl cannot be calculated (Patient's most recent lab result is older than the maximum 21 days  allowed.).  Clinical ASCVD: {YES/NO:21197} The 10-year ASCVD risk score (Arnett DK, et al., 2019) is: 2.2%   Values used to calculate the score:     Age: 27 years     Sex: Female     Is Non-Hispanic African American: Yes     Diabetic: No     Tobacco smoker: No     Systolic Blood Pressure: 161 mmHg     Is BP treated: Yes     HDL Cholesterol: 43 mg/dL     Total Cholesterol: 206 mg/dL  Patient is participating in a Managed Medicaid Plan:  {MM YES/NO:27447::"Yes"}    A/P: Hypertension diagnosed *** currently *** on current medications. BP goal < 130/80 *** mmHg. Medication adherence appears ***. Control is suboptimal due to ***.  -{Meds adjust:18428} ***.  -Patient educated on purpose, proper use, and potential adverse effects of ***.  -F/u labs ordered - *** -Counseled on lifestyle modifications for blood pressure control including reduced dietary sodium, increased exercise, adequate sleep. -Encouraged patient to check BP at home and bring log of readings to next visit. Counseled on proper use of home BP cuff.    Results reviewed and written information provided.    Written patient instructions provided. Patient verbalized understanding of treatment plan.  Total time in face to face counseling *** minutes.    Follow-up:  Pharmacist ***. PCP clinic visit in ***.  Patient seen with ***.

## 2022-04-29 ENCOUNTER — Ambulatory Visit (HOSPITAL_COMMUNITY): Payer: Medicaid Other | Attending: Cardiology

## 2022-04-29 DIAGNOSIS — R600 Localized edema: Secondary | ICD-10-CM | POA: Diagnosis not present

## 2022-04-29 DIAGNOSIS — M79602 Pain in left arm: Secondary | ICD-10-CM | POA: Diagnosis not present

## 2022-04-29 DIAGNOSIS — I10 Essential (primary) hypertension: Secondary | ICD-10-CM | POA: Diagnosis not present

## 2022-04-29 LAB — ECHOCARDIOGRAM COMPLETE
Area-P 1/2: 3.34 cm2
S' Lateral: 3.1 cm

## 2022-05-02 ENCOUNTER — Ambulatory Visit
Admission: RE | Admit: 2022-05-02 | Discharge: 2022-05-02 | Disposition: A | Discharge status: Home / Self Care | Payer: Medicaid Other | Source: Ambulatory Visit | Attending: Internal Medicine | Admitting: Internal Medicine

## 2022-05-02 DIAGNOSIS — M5412 Radiculopathy, cervical region: Secondary | ICD-10-CM | POA: Diagnosis not present

## 2022-05-02 DIAGNOSIS — M4802 Spinal stenosis, cervical region: Secondary | ICD-10-CM | POA: Diagnosis not present

## 2022-05-04 ENCOUNTER — Telehealth: Payer: Self-pay | Admitting: *Deleted

## 2022-05-04 DIAGNOSIS — I77810 Thoracic aortic ectasia: Secondary | ICD-10-CM

## 2022-05-04 NOTE — Telephone Encounter (Signed)
-----   Message from Freada Bergeron, MD sent at 05/03/2022  7:26 PM EST ----- Her echo looks good and shows normal pumping function. There is very mild leakiness of the mitral and aortic valves. 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 We will continue to monitor this going forward with yearly echoes.

## 2022-05-04 NOTE — Telephone Encounter (Signed)
The patient has been notified of the result and verbalized understanding.  All questions (if any) were answered.  Pt aware that she will need a repeat echo in one  year and I will go ahead and place the order for this in the system and send a message to our [facility] Schedulers to call her back and arrange this appt for that time.   Pt verbalized understanding and agrees with this plan.

## 2022-05-05 ENCOUNTER — Telehealth: Payer: Self-pay | Admitting: Internal Medicine

## 2022-05-05 ENCOUNTER — Telehealth: Payer: Self-pay | Admitting: Emergency Medicine

## 2022-05-05 DIAGNOSIS — M5412 Radiculopathy, cervical region: Secondary | ICD-10-CM

## 2022-05-05 NOTE — Telephone Encounter (Signed)
Phone call placed to patient this morning to go over the results of the MRI of her neck.  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  Also has a protruding disc at this level on the left side.  I recommend referral to a neurosurgeon to have this evaluated further to see whether she would benefit from injections versus surgery.  She is agreeable for me to submit referral. Patient then tells me that she had just called and left a message for me to let me know that she had started having some pain in the lower back.  When she went to have the MRI of the cervical spine she inform them and wanted to know whether they can also do the lower back but was told that order was placed only for the cervical spine.  Advised patient that she would need to schedule an in person appointment for evaluation of the lower back pain since that is a new issue.  She expressed understanding.

## 2022-05-05 NOTE — Telephone Encounter (Signed)
Copied from [place] [phone]. Topic: General - Other >> May 05, 2022  8:49 AM Ludger Nutting wrote: Patient would like to know if she can get an order for an MRI of her whole back done. Patient states that she is still in severe pain even with the medication and is not able to stand up straight.

## 2022-05-06 ENCOUNTER — Encounter: Payer: Self-pay | Admitting: Physician Assistant

## 2022-05-06 ENCOUNTER — Ambulatory Visit: Payer: Medicaid Other | Attending: Internal Medicine | Admitting: Physician Assistant

## 2022-05-06 VITALS — BP 136/96 | HR 78 | Wt 279.2 lb

## 2022-05-06 DIAGNOSIS — M62838 Other muscle spasm: Secondary | ICD-10-CM | POA: Diagnosis not present

## 2022-05-06 DIAGNOSIS — M545 Low back pain, unspecified: Secondary | ICD-10-CM | POA: Diagnosis not present

## 2022-05-06 MED ORDER — METHOCARBAMOL 500 MG PO TABS: 1000.0000 mg | ORAL_TABLET | Freq: Three times a day (TID) | ORAL | 0 refills | Status: DC | PRN

## 2022-05-06 MED ORDER — NAPROXEN 500 MG PO TABS: 500.0000 mg | ORAL_TABLET | Freq: Two times a day (BID) | ORAL | 0 refills | Status: DC

## 2022-05-06 NOTE — Telephone Encounter (Signed)
This is a message from yesterday that patient mention to me when I spoke with her.  I have already addressed this with the patient informing her to schedule an appointment for [place] to address the new issue.

## 2022-05-06 NOTE — Progress Notes (Deleted)
Patient ID: Brenda Colon, female   DOB: 07-26-1980, 41 y.o.   MRN: [id number]

## 2022-05-06 NOTE — Progress Notes (Signed)
Patient ID: Brenda Colon, female   DOB: 01/28/1981, 41 y.o.   MRN: [id number]   Brenda Colon, is a 41 y.o. female  TKW:[id number]  [id number]  DOB - [birth year]  Chief Complaint  Patient presents with   Back Pain       Subjective:   Brenda Colon is a 41 y.o. female here today for pain in mid lower back for 1 week.  She had an MRI of her c-spine but she thought it would show her whole back so she didn't mention it.  Pain is midline.  No radicular s/sx.  NKI.  Denies recent change in activity.  She does have a 42 yr old that she lifts and carries sometimes.    No problems updated.  ALLERGIES: No Known Allergies  PAST MEDICAL HISTORY: Past Medical History:  Diagnosis Date   Hypertension    STD (sexually transmitted disease)    Gonorrhea,Trich   Uterine fibroid     MEDICATIONS AT HOME: Prior to Admission medications   Medication Sig Start Date End Date Taking? Authorizing Provider  gabapentin (NEURONTIN) 100 MG capsule Take 2 capsules (200 mg total) by mouth at bedtime. 03/20/22  Yes Ladell Pier, MD  methocarbamol (ROBAXIN) 500 MG tablet Take 2 tablets (1,000 mg total) by mouth every 8 (eight) hours as needed for muscle spasms. X 7 days then as needed 05/06/22  Yes Francies Inch M, Brenda Colon  naproxen (NAPROSYN) 500 MG tablet Take 1 tablet (500 mg total) by mouth 2 (two) times daily with a meal. X 7 days then prn pain 05/06/22  Yes Nelline Lio M, Brenda Colon  omeprazole (PRILOSEC) 20 MG capsule Take 1 capsule (20 mg total) by mouth daily. 02/10/22  Yes Ladell Pier, MD  spironolactone (ALDACTONE) 25 MG tablet Take 0.5 tablets (12.5 mg total) by mouth daily. 03/20/22  Yes Ladell Pier, MD  traMADol (ULTRAM) 50 MG tablet Take 1 tablet (50 mg total) by mouth every 8 (eight) hours as needed. 03/20/22  Yes Ladell Pier, MD  valsartan (DIOVAN) 80 MG tablet Take 1 tablet (80 mg total) by mouth daily. Patient not taking: Reported on 05/06/2022 03/27/22   Freada Bergeron, MD  NIFEdipine (PROCARDIA-XL/ADALAT-CC/NIFEDICAL-XL) 30 MG 24 hr tablet Take by mouth once per day. 12/02/17 12/22/18  [name], MD    ROS: Neg HEENT Neg resp Neg cardiac Neg GI Neg GU Neg psych Neg neuro  Objective:   Vitals:   05/06/22 1545  BP: (!) 136/96  Pulse: 78  SpO2: 100%  Weight: 279 lb 3.2 oz (126.6 kg)   Exam General appearance : Awake, alert, not in any distress. Speech Clear. Not toxic looking.  Ambulates without dificulty HEENT: Atraumatic and Normocephalic, pupils equally reactive to light and accomodation Neck: Supple, no JVD. No cervical lymphadenopathy.  Chest: Good air entry bilaterally, CTAB.  No rales/rhonchi/wheezing CVS: S1 S2 regular, no murmurs.  Back ROM limited by obesity and pain.  No TTP over L-S spine.  There is paraspinus spasm in the L-S region.  BLE=intact B.  Neg SLR B Extremities: B/L Lower Ext shows no edema, both legs are warm to touch Neurology: Awake alert, and oriented X 3, CN II-XII intact, Non focal Skin: No Rash  Data Review Lab Results  Component Value Date   HGBA1C 5.9 (H) 01/15/2022   HGBA1C 5.4 01/12/2019   HGBA1C 5.4 07/29/2017    Assessment & Plan   1. Acute bilateral low back pain without sciatica No red flags -  DG Lumbar Spine Complete; Future - naproxen (NAPROSYN) 500 MG tablet; Take 1 tablet (500 mg total) by mouth 2 (two) times daily with a meal. X 7 days then prn pain  Dispense: 60 tablet; Refill: 0 - methocarbamol (ROBAXIN) 500 MG tablet; Take 2 tablets (1,000 mg total) by mouth every 8 (eight) hours as needed for muscle spasms. X 7 days then as needed  Dispense: 90 tablet; Refill: 0  2. Muscle spasm - DG Lumbar Spine Complete; Future - methocarbamol (ROBAXIN) 500 MG tablet; Take 2 tablets (1,000 mg total) by mouth every 8 (eight) hours as needed for muscle spasms. X 7 days then as needed  Dispense: 90 tablet; Refill: 0    Return for next scheduled appt with Dr Wynetta Emery.  The patient was  given clear instructions to go to ER or return to medical center if symptoms don't improve, worsen or new problems develop. The patient verbalized understanding. The patient was told to call to get lab results if they haven't heard anything in the next week.      Brenda Caldron, Brenda Colon [facility] and [place], [place]   05/06/2022, 4:57 PM

## 2022-05-06 NOTE — Telephone Encounter (Signed)
Patient was seen in-office today to address this issue. Imaging orders were placed.

## 2022-05-07 ENCOUNTER — Ambulatory Visit
Admission: RE | Admit: 2022-05-07 | Discharge: 2022-05-07 | Disposition: A | Discharge status: Home / Self Care | Payer: Medicaid Other | Source: Ambulatory Visit | Attending: Physician Assistant | Admitting: Physician Assistant

## 2022-05-07 DIAGNOSIS — M62838 Other muscle spasm: Secondary | ICD-10-CM

## 2022-05-07 DIAGNOSIS — M545 Low back pain, unspecified: Secondary | ICD-10-CM

## 2022-05-15 DIAGNOSIS — M5412 Radiculopathy, cervical region: Secondary | ICD-10-CM | POA: Diagnosis not present

## 2022-05-20 ENCOUNTER — Encounter: Payer: Self-pay | Admitting: Internal Medicine

## 2022-05-20 ENCOUNTER — Telehealth: Payer: Self-pay | Admitting: Internal Medicine

## 2022-05-22 NOTE — Telephone Encounter (Signed)
Called LVM for patient to call back

## 2022-05-25 NOTE — Telephone Encounter (Signed)
Called & spoke to the patient. Verified name & DOB. Informed that letter is ready for pick up. Patient expressed understanding.

## 2022-06-02 NOTE — Therapy (Incomplete)
OUTPATIENT PHYSICAL THERAPY CERVICAL EVALUATION   Patient Name: Brenda Colon MRN: [id number] DOB:07/12/1980, 41 y.o., female Today's Date: 06/02/2022  END OF SESSION:   Past Medical History:  Diagnosis Date   Hypertension    STD (sexually transmitted disease)    Gonorrhea,Trich   Uterine fibroid    Past Surgical History:  Procedure Laterality Date   CYSTECTOMY     Wrist   INDUCED ABORTION     Patient Active Problem List   Diagnosis Date Noted   Morbid obesity ([place]) 02/10/2022   GERD without esophagitis 02/10/2022   Prediabetes 02/10/2022   Hyperlipemia, mixed 11/30/2020   Esophageal dysphagia 11/29/2020   Essential hypertension 11/29/2020   Intrauterine pregnancy 08/21/2020   Pregnancy with uncertain fetal viability 08/21/2020   Chronic hypertension affecting pregnancy 12/30/2017   Family history of microcephaly 09/14/2017   Fibroid 08/15/2017   Chronic hypertension during pregnancy, antepartum 03/26/2017   GERD with esophagitis 03/26/2017    PCP:    Ladell Pier, MD    REFERRING PROVIDER: Vallarie Mare, MD   REFERRING DIAG:  [phone] (ICD-10-CM) - Radiculopathy, cervical region      THERAPY DIAG:  No diagnosis found.  Rationale for Evaluation and Treatment: Rehabilitation  ONSET DATE: ***  SUBJECTIVE:                                                                                                                                                                                                         SUBJECTIVE STATEMENT: ***  PERTINENT HISTORY:  ***  PAIN:  Are you having pain? Yes: {yespain:27235::"NPRS scale: ***/10","Pain location: ***","Pain description: ***","Aggravating factors: ***","Relieving factors: ***"}  PRECAUTIONS: {Therapy precautions:24002}  WEIGHT BEARING RESTRICTIONS: {Yes ***/No:24003}  FALLS:  Has patient fallen in last 6 months? {fallsyesno:27318}  LIVING ENVIRONMENT: Lives with: {OP[facility] lives  with:25569::"lives with their family"} Lives in: {Lives in:25570} Stairs: {opstairs:27293} Has following equipment at home: {Assistive devices:23999}  OCCUPATION: ***  PLOF: {PLOF:24004}  PATIENT GOALS: ***  NEXT MD VISIT:   OBJECTIVE:   DIAGNOSTIC FINDINGS:  ***  PATIENT SURVEYS:  NDI ***  COGNITION: Overall cognitive status: {cognition:24006}  SENSATION: {sensation:27233}  POSTURE: {posture:25561}  PALPATION: ***   CERVICAL ROM:   {AROM/PROM:27142} ROM A/PROM (deg) eval  Flexion   Extension   Right lateral flexion   Left lateral flexion   Right rotation   Left rotation    (Blank rows = not tested)  UPPER EXTREMITY ROM:  {AROM/PROM:27142} ROM Right eval Left eval  Shoulder flexion    Shoulder extension    Shoulder abduction  Shoulder adduction    Shoulder extension    Shoulder internal rotation    Shoulder external rotation    Elbow flexion    Elbow extension    Wrist flexion    Wrist extension    Wrist ulnar deviation    Wrist radial deviation    Wrist pronation    Wrist supination     (Blank rows = not tested)  UPPER EXTREMITY MMT:  MMT Right eval Left eval  Shoulder flexion    Shoulder extension    Shoulder abduction    Shoulder adduction    Shoulder extension    Shoulder internal rotation    Shoulder external rotation    Middle trapezius    Lower trapezius    Elbow flexion    Elbow extension    Wrist flexion    Wrist extension    Wrist ulnar deviation    Wrist radial deviation    Wrist pronation    Wrist supination    Grip strength     (Blank rows = not tested)  CERVICAL SPECIAL TESTS:  {Cervical special tests:25246}  FUNCTIONAL TESTS:  {Functional tests:24029}  TODAY'S TREATMENT:                                                                                                                              DATE: ***   PATIENT EDUCATION:  Education details: *** Person educated: {Person educated:25204} Education  method: {Education Method:25205} Education comprehension: {Education Comprehension:25206}  HOME EXERCISE PROGRAM: ***  ASSESSMENT:  CLINICAL IMPRESSION: Patient is a *** y.o. *** who was seen today for physical therapy evaluation and treatment for ***.   OBJECTIVE IMPAIRMENTS: {opptimpairments:25111}.   ACTIVITY LIMITATIONS: {activitylimitations:27494}  PARTICIPATION LIMITATIONS: {participationrestrictions:25113}  PERSONAL FACTORS: {Personal factors:25162} are also affecting patient's functional outcome.   REHAB POTENTIAL: {rehabpotential:25112}  CLINICAL DECISION MAKING: {clinical decision making:25114}  EVALUATION COMPLEXITY: {Evaluation complexity:25115}   GOALS: Goals reviewed with patient? {yes/no:20286}  SHORT TERM GOALS: Target date: ***  *** Baseline: *** Goal status: {GOALSTATUS:25110}  2.  *** Baseline: *** Goal status: {GOALSTATUS:25110}  3.  *** Baseline: *** Goal status: {GOALSTATUS:25110}  4.  *** Baseline: *** Goal status: {GOALSTATUS:25110}  5.  *** Baseline: *** Goal status: {GOALSTATUS:25110}  6.  *** Baseline: *** Goal status: {GOALSTATUS:25110}  LONG TERM GOALS: Target date: ***  *** Baseline: *** Goal status: {GOALSTATUS:25110}  2.  *** Baseline: *** Goal status: {GOALSTATUS:25110}  3.  *** Baseline: *** Goal status: {GOALSTATUS:25110}  4.  *** Baseline: *** Goal status: {GOALSTATUS:25110}  5.  *** Baseline: *** Goal status: {GOALSTATUS:25110}  6.  *** Baseline: *** Goal status: {GOALSTATUS:25110}   PLAN:  PT FREQUENCY: {rehab frequency:25116}  PT DURATION: {rehab duration:25117}  PLANNED INTERVENTIONS: {rehab planned interventions:25118::"Therapeutic exercises","Therapeutic activity","Neuromuscular re-education","Balance training","Gait training","Patient/Family education","Self Care","Joint mobilization"}  PLAN FOR NEXT SESSION: ***  Gwendolyn Grant, PT, DPT, ATC 06/02/22 1:23 PM    Check all  possible CPT codes: {cptcodes:24818}    Check all conditions  that are expected to impact treatment: {Conditions expected to impact treatment:28273}   If treatment provided at initial evaluation, no treatment charged due to lack of authorization.

## 2022-06-03 ENCOUNTER — Ambulatory Visit: Payer: Medicaid Other | Attending: Internal Medicine | Admitting: Physical Therapy

## 2022-06-03 ENCOUNTER — Encounter: Payer: Self-pay | Admitting: Physical Therapy

## 2022-06-03 ENCOUNTER — Other Ambulatory Visit: Payer: Self-pay

## 2022-06-03 ENCOUNTER — Ambulatory Visit: Payer: Medicaid Other

## 2022-06-03 DIAGNOSIS — M542 Cervicalgia: Secondary | ICD-10-CM | POA: Insufficient documentation

## 2022-06-03 DIAGNOSIS — M6281 Muscle weakness (generalized): Secondary | ICD-10-CM | POA: Diagnosis not present

## 2022-06-03 DIAGNOSIS — R293 Abnormal posture: Secondary | ICD-10-CM | POA: Diagnosis not present

## 2022-06-03 NOTE — Therapy (Signed)
OUTPATIENT PHYSICAL THERAPY CERVICAL EVALUATION   Patient Name: Brenda Colon MRN: [id number] DOB:1980-12-18, 41 y.o., female Today's Date: 06/03/2022  END OF SESSION:  PT End of Session - 06/03/22 1052     Visit Number 1    Number of Visits 17    Date for PT Re-Evaluation 07/29/22    Authorization Type [employer]    Authorization Time Period TBD    PT Start Time 1057    PT Stop Time 1145    PT Time Calculation (min) 48 min    Activity Tolerance Patient tolerated treatment well;No increased pain    Behavior During Therapy [facility] for tasks assessed/performed             Past Medical History:  Diagnosis Date   Hypertension    STD (sexually transmitted disease)    Gonorrhea,Trich   Uterine fibroid    Past Surgical History:  Procedure Laterality Date   CYSTECTOMY     Wrist   INDUCED ABORTION     Patient Active Problem List   Diagnosis Date Noted   Morbid obesity ([place]) 02/10/2022   GERD without esophagitis 02/10/2022   Prediabetes 02/10/2022   Hyperlipemia, mixed 11/30/2020   Esophageal dysphagia 11/29/2020   Essential hypertension 11/29/2020   Intrauterine pregnancy 08/21/2020   Pregnancy with uncertain fetal viability 08/21/2020   Chronic hypertension affecting pregnancy 12/30/2017   Family history of microcephaly 09/14/2017   Fibroid 08/15/2017   Chronic hypertension during pregnancy, antepartum 03/26/2017   GERD with esophagitis 03/26/2017    PCP: Ladell Pier, MD  REFERRING PROVIDER: Vallarie Mare, MD  REFERRING DIAG: [phone] (ICD-10-CM) - Radiculopathy, cervical region  THERAPY DIAG:  Cervicalgia  Abnormal posture  Muscle weakness (generalized)  Rationale for Evaluation and Treatment: Rehabilitation  ONSET DATE: September 2023  SUBJECTIVE:                                                                                                                                                                                                          SUBJECTIVE STATEMENT: Pt states in September she woke up with L sided neck pain that persisted and worsened, ended up spreading to L side of chest and shoulder. Received cardiac and imaging work up, she states it showed "a pinch nerve". Just prior to thanksgiving pt reports onset of low back pain (belt line) that has persisted as well. LUE numbness down to finger tips that has improved over past week or so. States symptoms have slowly improved and she has tried weaning some from medication but continues  to have relatively high pain levels. Has been reducing activity levels and modifying housework, modifying interactions with child due to pain. Has had similar episode of back pain in 2020 with increased lifting for [employer] job. Has typical headaches, no changes since symptom onset. Denies vision changes, difficulty swallowing, denies bowel/bladder, BUE symptoms, speech changes. Has had some dizziness when she first stands but no other incidents of dizziness, not provoked by head/neck movement  PERTINENT HISTORY:  HTN, BLE swelling (Received cardiac work up for chest/LUE pain, per cardiologist notes 03/27/22 felt to be MSK/neurological in nature)  PAIN:  Are you having pain: yes, 5/10 neck and 10/10 back Location: L sided neck and belt line low back How would you describe your pain? Stiff and occasional spasms Aggravating factors: bending/squatting, standing after sitting for a while, standing >74mn, housework Easing factors: gabapentin, rest, heated blanket  PRECAUTIONS: None  WEIGHT BEARING RESTRICTIONS: No  FALLS:  Has patient fallen in last 6 months? No  LIVING ENVIRONMENT: Has 456year old child (~50#); 1 story home 2STE no rail. Primary caregiver for child and home.   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  Cervical MRI 05/04/22 IMPRESSION: 1. 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 2. No spinal canal stenosis.  Reassuring cardiac work up per chart review, [facility]  PATIENT SURVEYS:  NDI: 20/50 raw score; 40%   COGNITION: Overall cognitive status: Within functional limits for tasks assessed; pt pleasant and conversationally appropriate  SENSATION/NEURO: Light touch intact B UE but lessened on L UE from C5-T1; also reports reduced sensation intermittently throughout LLE but not consistent dermatomal pattern, RLE fully intact Grip strength grossly equal  No incoordination w/ chin<>finger either UE No abnormal eye movements noted with environmental scanning   POSTURE: RUT elevation, forward head posture, kyphosis   PALPATION: Significant TTP L LS, UT, rhomboids, and infraspinatus with trigger points throughout   CERVICAL ROM:   Active ROM A/PROM (deg) eval  Flexion 75% concordant discomfort, pulling and UE symptom  Extension 100% mild pull in same spot   Right lateral flexion 75% pull  Left lateral flexion 75% pull  Right rotation 41 deg  Left rotation 46 deg    (Blank rows = not tested)  UPPER EXTREMITY ROM:  Active ROM Right eval Left eval  Shoulder flexion    Shoulder abduction    Shoulder internal rotation    Shoulder external rotation    Elbow flexion    Elbow extension    Wrist flexion    Wrist extension     (Blank rows = not tested) Comments:Shoulder flexion and elevation both provoke LUE symptoms to elbow, not further. Mild limitation in L GH ROM compared to R (~5-10 deg, not formally assessed)   UPPER EXTREMITY MMT:  MMT Right eval Left eval  Shoulder flexion 4+ 4-  Shoulder extension    Shoulder abduction 4+ 4  Shoulder  extension    Shoulder internal rotation 4+ 4 strain  Shoulder external rotation 4+ 4 strain  Elbow flexion    Elbow extension    Grip strength     (Blank rows = not tested)  Comments: No pain with shoulder MMT  CERVICAL SPECIAL TESTS:  Negative sharps purser Spurlings positive for reproduction of concordant neck pain, limited UE reproduction  FUNCTIONAL TESTS:  Limited overhead reach L compared to R with pain  TODAY'S TREATMENT:                                                                                                                               [facility] Adult PT Treatment:                                                DATE: 06/03/22 Therapeutic Exercise: Chin tuck x10 cues for form and HEP Scap retractions seated x10 cues for form and HEP  PATIENT EDUCATION:  Education details: Pt education on PT impairments, prognosis, and POC. Informed consent. Rationale for interventions, safe/appropriate HEP performance Person educated: Patient Education method: Explanation, Demonstration, Tactile cues, Verbal cues, and Handouts Education comprehension: verbalized understanding, returned demonstration, verbal cues required, tactile cues required, and needs further education    HOME EXERCISE PROGRAM: Access Code: [email or web address] URL: [email or web address] Date: 06/03/2022 Prepared by: Enis Slipper  Exercises - Seated Scapular Retraction  - 1 x daily - 7 x weekly - 3 sets - 10 reps - Seated Cervical Retraction  - 1 x daily - 7 x weekly - 3 sets - 10 reps  ASSESSMENT:  CLINICAL IMPRESSION: Patient is a 41 y.o. woman who was seen today for physical therapy evaluation and treatment for neck pain ongoing since September, no known mechanism of injury. Pt also relates LUE symptoms. Symptoms have improved somewhat since initial onset but remain consistent per pt report, and she also reports onset of low back pain since around thanksgiving. Pt is primary caregiver for her child and  has had to limit/modify housework and caregiving duties due to pain. On examination pt demonstrates limitations in cervical mobility and TTP of L periscapular/cervical musculature, mild limitations in L GH ROM compared to R. Also demonstrates mild reduction in light touch intermittently throughout L hemibody - will continue to monitor but other neuro questioning/examination unremarkable. Pt tolerates HEP well without any increase in resting pain, verbalizes good understanding of home performance. No adverse events. Recommend skilled PT to address aforementioned deficits and maximize functional tolerance for improved caregiving/household duties.   OBJECTIVE IMPAIRMENTS: decreased endurance, decreased mobility, decreased ROM, decreased strength, hypomobility, impaired perceived functional ability, impaired UE functional use, postural dysfunction, and pain.   ACTIVITY LIMITATIONS: carrying, lifting, bending, standing, squatting, sleeping, reach over head, and caring for others  PARTICIPATION LIMITATIONS: meal prep, cleaning, laundry, interpersonal relationship, community activity, and occupation  PERSONAL FACTORS: Past/current experiences, Time since onset of injury/illness/exacerbation, and 1-2 comorbidities: HTN  are also affecting patient's functional outcome.   REHAB POTENTIAL: Good  CLINICAL DECISION MAKING: Stable/uncomplicated  EVALUATION COMPLEXITY: Low   GOALS:  Goals reviewed with patient? No  SHORT TERM GOALS: Target date: 07/01/2022    Pt will demonstrate appropriate understanding and performance of initially prescribed HEP in order to facilitate improved independence with management of symptoms.  Baseline: HEP provided on eval Goal status: INITIAL   2. Pt will score less than or equal to 33% on NDI in order to demonstrate improved perception of function due to symptoms.  Baseline: 40%  Goal status: INITIAL   LONG TERM GOALS: Target date: 07/29/2022  Pt will score less than or  equal to 22% on NDI in order to demonstrate improved perception of function due to symptoms. Baseline: 40% NDI Goal status: INITIAL  Pt will demonstrate at least 50 degrees of active cervical rotation ROM bilaterally in order to demonstrate improved environmental awareness and safety with driving.  Baseline: see ROM chart above Goal status: INITIAL  Pt will demonstrate at least 4+/5 shoulder MMT for improved symmetry of UE strength and improved tolerance to functional movements.  Baseline: see MMT chart above Goal status: INITIAL   Pt will report/demonstrate ability to standing for greater than half an hour with less than 2 point increase on NPS in order to demonstrate improved tolerance to work activities.  Baseline: up to 10/10 pain with standing (tolerance ~25mn) Goal status: INITIAL  5. Pt will demonstrate/report ability to lift up to 25# with less than 2 pt increase in pain on NPS in order to demonstrate improved tolerance to caregiving/household duties.  Baseline: avoiding lifting, NT on eval d/t time constraints Goal status: INITIAL   PLAN:  PT FREQUENCY: 1-2x/week   PT DURATION: 8 weeks  PLANNED INTERVENTIONS: Therapeutic exercises, Therapeutic activity, Neuromuscular re-education, Balance training, Gait training, Patient/Family education, Self Care, Joint mobilization, Stair training, Aquatic Therapy, Dry Needling, Electrical stimulation, Spinal mobilization, Cryotherapy, Moist heat, Taping, Manual therapy, and Re-evaluation  PLAN FOR NEXT SESSION: Progress ROM/strengthening exercises as able/appropriate, review HEP.    DLeeroy ChaPT, DPT 06/03/2022 12:49 PM    Check all possible CPT codes: [id number]- PT Re-evaluation, 97110- Therapeutic Exercise, [phone] Neuro Re-education, [phone]- Gait Training, [phone]- Manual Therapy, [phone]- Therapeutic Activities, [phone]- Self Care, and [phone]- Aquatic therapy    Check all conditions that are expected to impact treatment:  Musculoskeletal disorders   If treatment provided at initial evaluation, no treatment charged due to lack of authorization.

## 2022-06-04 ENCOUNTER — Ambulatory Visit: Payer: Medicaid Other | Admitting: Physician Assistant

## 2022-06-22 NOTE — Therapy (Signed)
OUTPATIENT PHYSICAL THERAPY RE-EVALUATION + TREATMENT   Patient Name: Brenda Colon MRN: [id number] DOB:10-Apr-1981, 42 y.o., female Today's Date: 06/24/2022  END OF SESSION:  PT End of Session - 06/24/22 1331     Visit Number 2    Number of Visits 17    Date for PT Re-Evaluation 08/19/22    Authorization Type [employer]    PT Start Time 1332    PT Stop Time 1412    PT Time Calculation (min) 40 min    Activity Tolerance Patient tolerated treatment well;No increased pain    Behavior During Therapy [facility] for tasks assessed/performed              Past Medical History:  Diagnosis Date   Hypertension    STD (sexually transmitted disease)    Gonorrhea,Trich   Uterine fibroid    Past Surgical History:  Procedure Laterality Date   CYSTECTOMY     Wrist   INDUCED ABORTION     Patient Active Problem List   Diagnosis Date Noted   Morbid obesity ([place]) 02/10/2022   GERD without esophagitis 02/10/2022   Prediabetes 02/10/2022   Hyperlipemia, mixed 11/30/2020   Esophageal dysphagia 11/29/2020   Essential hypertension 11/29/2020   Intrauterine pregnancy 08/21/2020   Pregnancy with uncertain fetal viability 08/21/2020   Chronic hypertension affecting pregnancy 12/30/2017   Family history of microcephaly 09/14/2017   Fibroid 08/15/2017   Chronic hypertension during pregnancy, antepartum 03/26/2017   GERD with esophagitis 03/26/2017    PCP: Ladell Pier, MD  REFERRING PROVIDER: Vallarie Mare, MD  REFERRING DIAG: [phone] (ICD-10-CM) - Radiculopathy, cervical region M54.9 (ICD-10-CM) - Dorsalgia, unspecified (added on 06/24/22)  THERAPY DIAG:  Cervicalgia  Abnormal posture  Muscle weakness (generalized)  Other low back pain  Rationale for Evaluation and Treatment: Rehabilitation  ONSET DATE: September 2023  SUBJECTIVE:                                                                                                                                                                                                          SUBJECTIVE STATEMENT: 06/24/2022 Pt states since initial eval she is still having discomfort that is about the same, still on restrictions at work through at least February. Pt notes work activities even on light duty continues to have increased symptoms. Pt does note new onset of R arm pain when she is doing heavier activities that was not present last time she was here.  Notes back pain is more irritated by bending, reaching, and standing/walking. No N/T or pain  in LE (aside from chronic anterolateral L thigh numbness she states is chronic but has not mentioned to other providers, this writer encouraged pt to do so). Overall pain is slightly better than before but pt is worried about return to PLOF as light duty restrictions remain provocative. Pt does note some increase in headaches that she initially thought was related to menstrual cycle, relieved by medication, but seems to be more persistent than what she is used to. Continues to deny other neurological symptoms  Eval - Pt states in September she woke up with L sided neck pain that persisted and worsened, ended up spreading to L side of chest and shoulder. Received cardiac and imaging work up, she states it showed "a pinch nerve". Just prior to thanksgiving pt reports onset of low back pain (belt line) that has persisted as well. LUE numbness down to finger tips that has improved over past week or so. States symptoms have slowly improved and she has tried weaning some from medication but continues to have relatively high pain levels. Has been reducing activity levels and modifying housework, modifying interactions with child due to pain. Has had similar episode of back pain in 2020 with increased lifting for [employer] job. Has typical headaches, no changes since symptom onset. Denies vision changes, difficulty swallowing, denies bowel/bladder, BUE symptoms, speech changes. Has had some  dizziness when she first stands but no other incidents of dizziness, not provoked by head/neck movement  PERTINENT HISTORY:  HTN, BLE swelling (Received cardiac work up for chest/LUE pain, per cardiologist notes 03/27/22 felt to be MSK/neurological in nature)  PAIN:  Are you having pain: yes, 4-5/10 neck and 4-5/10 back Location: L sided neck and belt line low back (also near sacrum) How would you describe your pain? Stiff and occasional spasms Aggravating factors: bending/squatting, standing after sitting for a while, standing 5-15mn, housework Easing factors: gabapentin, rest, heated blanket  PRECAUTIONS: None  WEIGHT BEARING RESTRICTIONS: No  FALLS:  Has patient fallen in last 6 months? No  LIVING ENVIRONMENT: Has 427year old child (~50#); 1 story home 2STE no rail. Primary caregiver for child and home.   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  Cervical MRI 05/04/22 IMPRESSION: 1. 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 2. No spinal canal stenosis.  Reassuring cardiac work up per chart review, [facility]  PATIENT SURVEYS:  NDI: 20/50 raw score; 40%  06/24/22 modified oswestry: 21/50 raw score; 42%   COGNITION: Overall cognitive status: Within functional limits for tasks assessed; pt pleasant and conversationally appropriate  SENSATION/NEURO: Light touch intact B UE but lessened on L UE from C5-T1; also reports reduced sensation intermittently throughout LLE but not consistent dermatomal pattern, RLE fully intact Grip strength grossly equal  No incoordination w/ chin<>finger either UE No  abnormal eye movements noted with environmental scanning   POSTURE: RUT elevation, forward head posture, kyphosis   PALPATION: Significant TTP L LS, UT, rhomboids, and infraspinatus with trigger points throughout   CERVICAL ROM:   Active ROM A/PROM (deg) eval  Flexion 75% concordant discomfort, pulling and UE symptom  Extension 100% mild pull in same spot  Right lateral flexion 75% pull  Left lateral flexion 75% pull  Right rotation 41 deg  Left rotation 46 deg    (Blank rows = not tested)  UPPER EXTREMITY ROM:  Active ROM Right eval Left eval  Shoulder flexion    Shoulder abduction    Shoulder internal rotation    Shoulder external rotation    Elbow flexion    Elbow extension    Wrist flexion    Wrist extension     (Blank rows = not tested) Comments:Shoulder flexion and elevation both provoke LUE symptoms to elbow, not further. Mild limitation in L GH ROM compared to R (~5-10 deg, not formally assessed)   LUMBAR ROM:   Active  A/PROM  06/24/22  Flexion 75% mid shin s (pt stops before)  Extension 50% *  Right lateral flexion 50% (just above knee) *  Left lateral flexion 50% (just above knee) *  Right rotation 100%  Left rotation 75% s   (Blank rows = not tested) (Key: WFL = within functional limits not formally assessed, * = concordant pain, s = stiffness/stretching sensation, NT = not tested)   Comments: repeated extension no relief or change in tolerance to flexion  UPPER EXTREMITY MMT:  MMT Right eval Left eval  Shoulder flexion 4+ 4-  Shoulder extension    Shoulder abduction 4+ 4  Shoulder extension    Shoulder internal rotation 4+ 4 strain  Shoulder external rotation 4+ 4 strain  Elbow flexion    Elbow extension    Grip strength     (Blank rows = not tested)  Comments: No pain with shoulder MMT  LOWER EXTREMITY MMT:    MMT Right 06/24/22 Left 06/24/22  Hip flexion 4 s 4 s  Hip abduction (modified sitting) 4 4  Hip internal rotation 4- 4-   Hip external rotation 3+ * 4  Knee flexion 4 4  Knee extension 4 4   (Blank rows = not tested) (Key: WFL = within functional limits not formally assessed, * = concordant pain, s = stiffness/stretching sensation, NT = not tested)  Comments:    CERVICAL SPECIAL TESTS:  Negative sharps purser Spurlings positive for reproduction of concordant neck pain, limited UE reproduction  FUNCTIONAL TESTS:  Limited overhead reach L compared to R with pain 5xSTS 06/24/22 5xSTS: 17.02 sec standard chair no UE support  TODAY'S TREATMENT:                                                                                                                              [facility] Adult PT Treatment:                                                DATE: 06/24/22 Therapeutic Exercise: Chin tuck 2x10 cues for form and HEP Scap retraction 2x10 cues for  form and HEP STS 2x5 cues for form and HEP Adductor iso x10 cues for form and HEP   [facility] Adult PT Treatment:                                                DATE: 06/03/22 Therapeutic Exercise: Chin tuck x10 cues for form and HEP Scap retractions seated x10 cues for form and HEP  PATIENT EDUCATION:  Education details: Pt education on PT impairments, prognosis, and POC. Informed consent. Rationale for interventions, safe/appropriate HEP performance Person educated: Patient Education method: Explanation, Demonstration, Tactile cues, Verbal cues, and Handouts Education comprehension: verbalized understanding, returned demonstration, verbal cues required, tactile cues required, and needs further education    HOME EXERCISE PROGRAM: Access Code: [email or web address] URL: [email or web address] Date: 06/24/2022 Prepared by: Enis Slipper  Exercises - Seated Scapular Retraction  - 1 x daily - 7 x weekly - 3 sets - 10 reps - Seated Cervical Retraction  - 1 x daily - 7 x weekly - 3 sets - 10 reps - Sit to Stand with Armchair  - 1 x daily - 7 x weekly - 3 sets - 5 reps -  Seated Hip Adduction Isometrics with Ball  - 1 x daily - 7 x weekly - 3 sets - 10 reps  ASSESSMENT:  CLINICAL IMPRESSION: 06/24/2022 Today's session includes re-evaluation to incorporate new referral for dorsalgia. Pt demonstrates reduced lumbar ROM globally, most irritation with extension which does not benefit from repeated extension. Hip weakness R>L overall. 5xSTS scored at 17sec which is indicative of fall risk in most populations (cutoff score 12-14sec). Reviewed cervical HEP which pt reports limited adherence to since initial eval, addition of lumbar program with good tolerance. No overt increase in pain on departure, no adverse events. Updated goals and POC as below. Pt departs today's session in no acute distress, all voiced questions/concerns addressed appropriately from PT perspective.     Eval - Patient is a 42 y.o. woman who was seen today for physical therapy evaluation and treatment for neck pain ongoing since September, no known mechanism of injury. Pt also relates LUE symptoms. Symptoms have improved somewhat since initial onset but remain consistent per pt report, and she also reports onset of low back pain since around thanksgiving. Pt is primary caregiver for her child and has had to limit/modify housework and caregiving duties due to pain. On examination pt demonstrates limitations in cervical mobility and TTP of L periscapular/cervical musculature, mild limitations in L GH ROM compared to R. Also demonstrates mild reduction in light touch intermittently throughout L hemibody - will continue to monitor but other neuro questioning/examination unremarkable. Pt tolerates HEP well without any increase in resting pain, verbalizes good understanding of home performance. No adverse events. Recommend skilled PT to address aforementioned deficits and maximize functional tolerance for improved caregiving/household duties.   OBJECTIVE IMPAIRMENTS: decreased endurance, decreased mobility, decreased  ROM, decreased strength, hypomobility, impaired perceived functional ability, impaired UE functional use, postural dysfunction, and pain.   ACTIVITY LIMITATIONS: carrying, lifting, bending, standing, squatting, sleeping, reach over head, and caring for others  PARTICIPATION LIMITATIONS: meal prep, cleaning, laundry, interpersonal relationship, community activity, and occupation  PERSONAL FACTORS: Past/current experiences, Time since onset of injury/illness/exacerbation, and 1-2 comorbidities: HTN  are also affecting patient's functional outcome.   REHAB POTENTIAL: Good  CLINICAL DECISION MAKING: Stable/uncomplicated  EVALUATION COMPLEXITY: Low  GOALS: Goals reviewed with patient? No  SHORT TERM GOALS: Target date: 07/22/2022 Pt will demonstrate appropriate understanding and performance of initially prescribed HEP in order to facilitate improved independence with management of symptoms.  Baseline: HEP provided on eval Goal status: INITIAL   2. Pt will score less than or equal to 33% on NDI in order to demonstrate improved perception of function due to symptoms.  Baseline: 40%  Goal status: INITIAL   LONG TERM GOALS: Target date: 08/19/2022 Pt will score less than or equal to 22% on NDI in order to demonstrate improved perception of function due to symptoms. Baseline: 40% NDI Goal status: INITIAL  Pt will demonstrate at least 50 degrees of active cervical rotation ROM bilaterally in order to demonstrate improved environmental awareness and safety with driving.  Baseline: see ROM chart above Goal status: INITIAL  Pt will demonstrate at least 4+/5 shoulder MMT for improved symmetry of UE strength and improved tolerance to functional movements.  Baseline: see MMT chart above Goal status: INITIAL   Pt will report/demonstrate ability to standing for greater than half an hour with less than 2 point increase on NPS in order to demonstrate improved tolerance to work activities.   Baseline: up to 10/10 pain with standing (tolerance ~28mn) Goal status: INITIAL  5. Pt will demonstrate/report ability to lift up to 25# with less than 2 pt increase in pain on NPS in order to demonstrate improved tolerance to caregiving/household duties.  Baseline: avoiding lifting, NT on eval d/t time constraints Goal status: INITIAL  6. Pt will score less than or equal to 25% on ODI in order to demonstrate improved perception of disability due to back pain.   Baseline: 42%   Goal status: INITIAL (06/24/22)  7. Pt will perform 5xSTS in less than 12sec in order to indicate reduced fall risk and improved functional mobility (cutoff score 12-14sec, MCID 2.3sec)  Baseline: 17sec w/ gentle UE support  Goal status: INITIAL (06/24/22)    PLAN (updated 06/24/22):  PT FREQUENCY: 1-2x/week   PT DURATION: 8 weeks  PLANNED INTERVENTIONS: Therapeutic exercises, Therapeutic activity, Neuromuscular re-education, Balance training, Gait training, Patient/Family education, Self Care, Joint mobilization, Stair training, Aquatic Therapy, Dry Needling, Electrical stimulation, Spinal mobilization, Cryotherapy, Moist heat, Taping, Manual therapy, and Re-evaluation  PLAN FOR NEXT SESSION: Progress ROM/strengthening exercises as able/appropriate, review HEP.    DLeeroy ChaPT, DPT 06/24/2022 3:19 PM    Check all possible CPT codes: [phone]- PT Re-evaluation, 97110- Therapeutic Exercise, [phone] Neuro Re-education, [phone]- Gait Training, [phone]- Manual Therapy, [phone]- Therapeutic Activities, [phone]- Self Care, and [phone]- Aquatic therapy    Check all conditions that are expected to impact treatment: Musculoskeletal disorders; multiple treatment areas   If treatment provided at initial evaluation, no treatment charged due to lack of authorization.

## 2022-06-22 NOTE — Progress Notes (Deleted)
  Cardiology Office Note:    Date:  06/22/2022   ID:  Brenda Colon, DOB 02/01/1981, MRN [id number]  PCP:  Ladell Pier, MD  [place] Providers Cardiologist:  None { Click to update primary MD,subspecialty MD or APP then REFRESH:1}  *** Referring MD: Ladell Pier, MD   Chief Complaint:  No chief complaint on file. {Click here for Visit Info    :1}   Patient Profile: Hypertension  Dilated ascending aorta TTE 04/29/22: EF 60-65, no RWMA, mild LVH, NL RVSF, NL PASP (RVSP 27.8), trivial MR, trivial AI, mild dilation of Asc Ao (41 mm), RAP 3 Pre-diabetes  Obesity  Leg edema   History of Present Illness:   Brenda Colon is a 42 y.o. female with the above problem list.  She was evaluated for L arm pain by Dr. Johney Frame 03/27/22. This was felt to be non-cardiac and most likely nerve compression. No further cardiac w/u was recommended. She was noted to have leg edema. Amlodipine was DCd and she was placed on Valsartan. BNP was normal. She had an echocardiogram which demonstrated normal EF and mildly dilated ascending aorta at 41 mm. She returns for f/u on HTN, leg edema. ***  {EKG done?:28833::"EKG: ***"}     Reviewed and updated this encounter:***     ROS  Labs/Other Test Reviewed:   Recent Labs: 01/15/2022: ALT 8 02/27/2022: B Natriuretic Peptide 16.0 03/03/2022: Hemoglobin 11.9; Platelets 250 03/27/2022: BUN 10; Creatinine, Ser 0.86; NT-Pro BNP <36; Potassium 3.9; Sodium 139  Recent Lipid Panel No results for input(s): "CHOL", "TRIG", "HDL", "VLDL", "LDLCALC", "LDLDIRECT" in the last 8760 hours.  Risk Assessment/Calculations/Metrics:   {Does this patient have ATRIAL FIBRILLATION?:[phone]}     No BP recorded.  {Refresh Note OR Click here to enter BP  :1}***   Physical Exam:   VS:  There were no vitals taken for this visit.   Wt Readings from Last 3 Encounters:  05/06/22 279 lb 3.2 oz (126.6 kg)  03/27/22 281 lb (127.5 kg)  03/20/22 218 lb 9.6 oz (99.2  kg)    Physical Exam ***     ASSESSMENT & PLAN:   No problem-specific Assessment & Plan notes found for this encounter.        {Are you ordering a CV Procedure (e.g. stress test, cath, DCCV, TEE, etc)?   Press F2        :[id number]}  Dispo:  No follow-ups on file.  Medication Adjustments/Labs and Tests Ordered: Current medicines are reviewed at length with the patient today.  Concerns regarding medicines are outlined above.  Tests Ordered: No orders of the defined types were placed in this encounter.  Medication Changes: No orders of the defined types were placed in this encounter.  Signed, Richardson Dopp, PA-C  06/22/2022 10:00 PM    [place], [place], [place]  [address] Phone: [phone]; Fax: [phone]

## 2022-06-23 ENCOUNTER — Ambulatory Visit: Payer: Medicaid Other | Attending: Physician Assistant | Admitting: Physician Assistant

## 2022-06-23 DIAGNOSIS — I10 Essential (primary) hypertension: Secondary | ICD-10-CM

## 2022-06-24 ENCOUNTER — Ambulatory Visit: Payer: Medicaid Other | Attending: Neurosurgery | Admitting: Physical Therapy

## 2022-06-24 ENCOUNTER — Encounter: Payer: Self-pay | Admitting: Physical Therapy

## 2022-06-24 DIAGNOSIS — M6281 Muscle weakness (generalized): Secondary | ICD-10-CM | POA: Insufficient documentation

## 2022-06-24 DIAGNOSIS — R293 Abnormal posture: Secondary | ICD-10-CM | POA: Diagnosis not present

## 2022-06-24 DIAGNOSIS — M5459 Other low back pain: Secondary | ICD-10-CM | POA: Diagnosis not present

## 2022-06-24 DIAGNOSIS — M542 Cervicalgia: Secondary | ICD-10-CM | POA: Insufficient documentation

## 2022-07-01 NOTE — Therapy (Signed)
OUTPATIENT PHYSICAL THERAPY TREATMENT NOTE    Patient Name: Brenda Colon MRN: [id number] DOB:1980-07-23, 42 y.o., female Today's Date: 07/03/2022  END OF SESSION:  PT End of Session - 07/03/22 1409     Visit Number 3    Number of Visits 17    Date for PT Re-Evaluation 08/19/22    Authorization Type [employer]    Authorization Time Period 06/26/22-07/25/22    Authorization - Visit Number 1    Authorization - Number of Visits 4    PT Start Time 1409   pt late arrival   PT Stop Time 4196    PT Time Calculation (min) 40 min    Activity Tolerance Patient tolerated treatment well;No increased pain    Behavior During Therapy [facility] for tasks assessed/performed               Past Medical History:  Diagnosis Date   Hypertension    STD (sexually transmitted disease)    Gonorrhea,Trich   Uterine fibroid    Past Surgical History:  Procedure Laterality Date   CYSTECTOMY     Wrist   INDUCED ABORTION     Patient Active Problem List   Diagnosis Date Noted   Morbid obesity ([place]) 02/10/2022   GERD without esophagitis 02/10/2022   Prediabetes 02/10/2022   Hyperlipemia, mixed 11/30/2020   Esophageal dysphagia 11/29/2020   Essential hypertension 11/29/2020   Intrauterine pregnancy 08/21/2020   Pregnancy with uncertain fetal viability 08/21/2020   Chronic hypertension affecting pregnancy 12/30/2017   Family history of microcephaly 09/14/2017   Fibroid 08/15/2017   Chronic hypertension during pregnancy, antepartum 03/26/2017   GERD with esophagitis 03/26/2017    PCP: Ladell Pier, MD  REFERRING PROVIDER: Vallarie Mare, MD  REFERRING DIAG: [phone] (ICD-10-CM) - Radiculopathy, cervical region M54.9 (ICD-10-CM) - Dorsalgia, unspecified (added on 06/24/22)  THERAPY DIAG:  Cervicalgia  Abnormal posture  Muscle weakness (generalized)  Other low back pain  Rationale for Evaluation and Treatment: Rehabilitation  ONSET DATE: September  2023  SUBJECTIVE:                                                                                                                                                                                                         SUBJECTIVE STATEMENT: 07/03/2022 Pt reports primary complaint of stiffness in neck and back. States she felt about the same as normal after last session. Reports some adherence with HEP, no inc in pain. Notes her symptoms have been a bit more aggravated this week due to increased life stressors.  Eval - Pt states in September she woke up with L sided neck pain that persisted and worsened, ended up spreading to L side of chest and shoulder. Received cardiac and imaging work up, she states it showed "a pinch nerve". Just prior to thanksgiving pt reports onset of low back pain (belt line) that has persisted as well. LUE numbness down to finger tips that has improved over past week or so. States symptoms have slowly improved and she has tried weaning some from medication but continues to have relatively high pain levels. Has been reducing activity levels and modifying housework, modifying interactions with child due to pain. Has had similar episode of back pain in 2020 with increased lifting for [employer] job. Has typical headaches, no changes since symptom onset. Denies vision changes, difficulty swallowing, denies bowel/bladder, BUE symptoms, speech changes. Has had some dizziness when she first stands but no other incidents of dizziness, not provoked by head/neck movement  PERTINENT HISTORY:  HTN, BLE swelling (Received cardiac work up for chest/LUE pain, per cardiologist notes 03/27/22 felt to be MSK/neurological in nature)  PAIN:  Are you having pain: yes, 07/03/2022 6/10 neck and back Location: L sided neck and belt line low back (also near sacrum) How would you describe your pain? Stiff and occasional spasms Aggravating factors: bending/squatting, standing after sitting for a while,  standing 5-68mn, housework Easing factors: gabapentin, rest, heated blanket  PRECAUTIONS: None  WEIGHT BEARING RESTRICTIONS: No  FALLS:  Has patient fallen in last 6 months? No  LIVING ENVIRONMENT: Has 460year old child (~50#); 1 story home 2STE no rail. Primary caregiver for child and home.   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  Cervical MRI 05/04/22 IMPRESSION: 1. 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 2. No spinal canal stenosis.  Reassuring cardiac work up per chart review, [facility]  PATIENT SURVEYS:  NDI: 20/50 raw score; 40%  06/24/22 modified oswestry: 21/50 raw score; 42%   COGNITION: Overall cognitive status: Within functional limits for tasks assessed; pt pleasant and conversationally appropriate  SENSATION/NEURO: Light touch intact B UE but lessened on L UE from C5-T1; also reports reduced sensation intermittently throughout LLE but not consistent dermatomal pattern, RLE fully intact Grip strength grossly equal  No incoordination w/ chin<>finger either UE No abnormal eye movements noted with environmental scanning   POSTURE: RUT elevation, forward head posture, kyphosis   PALPATION: Significant TTP L LS, UT, rhomboids, and infraspinatus with trigger points throughout   CERVICAL ROM:   Active ROM A/PROM (deg) eval  Flexion 75% concordant discomfort, pulling and UE symptom  Extension 100% mild pull in same spot   Right lateral flexion 75% pull  Left lateral flexion 75% pull  Right rotation 41 deg  Left rotation 46 deg     (Blank rows = not tested)  UPPER EXTREMITY ROM:  Active ROM Right eval Left eval  Shoulder flexion    Shoulder abduction    Shoulder internal rotation    Shoulder external rotation    Elbow flexion    Elbow extension    Wrist flexion  Wrist extension     (Blank rows = not tested) Comments:Shoulder flexion and elevation both provoke LUE symptoms to elbow, not further. Mild limitation in L GH ROM compared to R (~5-10 deg, not formally assessed)   LUMBAR ROM:   Active  A/PROM  06/24/22  Flexion 75% mid shin s (pt stops before)  Extension 50% *  Right lateral flexion 50% (just above knee) *  Left lateral flexion 50% (just above knee) *  Right rotation 100%  Left rotation 75% s   (Blank rows = not tested) (Key: WFL = within functional limits not formally assessed, * = concordant pain, s = stiffness/stretching sensation, NT = not tested)   Comments: repeated extension no relief or change in tolerance to flexion  UPPER EXTREMITY MMT:  MMT Right eval Left eval  Shoulder flexion 4+ 4-  Shoulder extension    Shoulder abduction 4+ 4  Shoulder extension    Shoulder internal rotation 4+ 4 strain  Shoulder external rotation 4+ 4 strain  Elbow flexion    Elbow extension    Grip strength     (Blank rows = not tested)  Comments: No pain with shoulder MMT  LOWER EXTREMITY MMT:    MMT Right 06/24/22 Left 06/24/22  Hip flexion 4 s 4 s  Hip abduction (modified sitting) 4 4  Hip internal rotation 4- 4-  Hip external rotation 3+ * 4  Knee flexion 4 4  Knee extension 4 4   (Blank rows = not tested) (Key: WFL = within functional limits not formally assessed, * = concordant pain, s = stiffness/stretching sensation, NT = not tested)  Comments:    CERVICAL SPECIAL TESTS:  Negative sharps purser Spurlings positive for reproduction of concordant neck pain, limited UE reproduction  FUNCTIONAL TESTS:  Limited overhead reach L compared to R with pain 5xSTS 06/24/22 5xSTS:  17.02 sec standard chair no UE support  TODAY'S TREATMENT:                                                                                                                              [facility] Adult PT Treatment:                                                DATE: 07/03/22 Therapeutic Exercise: Seated chin tuck 2x10 cues for form and reduced compensations Scapular retractions 2x10 cues for form and reduced compensations STS 3x5 cues for mechanics and BOS Adductor iso 3x10 cues for pacing  LTR x8 B cues for appropriate ROM  Seated swiss ball press down 2x10 cues for breath control and form  Seated swiss ball flexion x12 cues for form and appropriate ROM   [facility] Adult PT Treatment:  DATE: 06/24/22 Therapeutic Exercise: Chin tuck 2x10 cues for form and HEP Scap retraction 2x10 cues for form and HEP STS 2x5 cues for form and HEP Adductor iso x10 cues for form and HEP   [facility] Adult PT Treatment:                                                DATE: 06/03/22 Therapeutic Exercise: Chin tuck x10 cues for form and HEP Scap retractions seated x10 cues for form and HEP  PATIENT EDUCATION:  Education details: rationale for interventions, relevant anatomy and physiology, HEP review Person educated: Patient Education method: Explanation, Demonstration, Tactile cues, Verbal cues Education comprehension: verbalized understanding, returned demonstration, verbal cues required, tactile cues required, and needs further education    HOME EXERCISE PROGRAM: Access Code: [email or web address] URL: [email or web address] Date: 06/24/2022 Prepared by: Enis Slipper  Exercises - Seated Scapular Retraction  - 1 x daily - 7 x weekly - 3 sets - 10 reps - Seated Cervical Retraction  - 1 x daily - 7 x weekly - 3 sets - 10 reps - Sit to Stand with Armchair  - 1 x daily - 7 x weekly - 3 sets - 5 reps - Seated Hip Adduction Isometrics with Ball  - 1 x daily - 7 x weekly  - 3 sets - 10 reps  ASSESSMENT:  CLINICAL IMPRESSION: 07/03/2022 Pt arrives to session w/ 6/10 pain in neck and back, primary report of stiffness which she attributes to increased life stressors this week. Session initiated with HEP review with cues as above, pt tolerates well with baseline discomfort. Addition of core isometric exercises with good tolerance and lumbar mobility training without increase in pain, primary report of muscle fatigue. No adverse events, pt departs without increase in symptoms. Pt departs today's session in no acute distress, all voiced questions/concerns addressed appropriately from PT perspective.    Eval - Patient is a 42 y.o. woman who was seen today for physical therapy evaluation and treatment for neck pain ongoing since September, no known mechanism of injury. Pt also relates LUE symptoms. Symptoms have improved somewhat since initial onset but remain consistent per pt report, and she also reports onset of low back pain since around thanksgiving. Pt is primary caregiver for her child and has had to limit/modify housework and caregiving duties due to pain. On examination pt demonstrates limitations in cervical mobility and TTP of L periscapular/cervical musculature, mild limitations in L GH ROM compared to R. Also demonstrates mild reduction in light touch intermittently throughout L hemibody - will continue to monitor but other neuro questioning/examination unremarkable. Pt tolerates HEP well without any increase in resting pain, verbalizes good understanding of home performance. No adverse events. Recommend skilled PT to address aforementioned deficits and maximize functional tolerance for improved caregiving/household duties.   OBJECTIVE IMPAIRMENTS: decreased endurance, decreased mobility, decreased ROM, decreased strength, hypomobility, impaired perceived functional ability, impaired UE functional use, postural dysfunction, and pain.   ACTIVITY LIMITATIONS: carrying,  lifting, bending, standing, squatting, sleeping, reach over head, and caring for others  PARTICIPATION LIMITATIONS: meal prep, cleaning, laundry, interpersonal relationship, community activity, and occupation  PERSONAL FACTORS: Past/current experiences, Time since onset of injury/illness/exacerbation, and 1-2 comorbidities: HTN  are also affecting patient's functional outcome.   REHAB POTENTIAL: Good  CLINICAL DECISION MAKING: Stable/uncomplicated  EVALUATION COMPLEXITY: Low   GOALS: Goals reviewed with patient?  No  SHORT TERM GOALS: Target date: 07/22/2022 Pt will demonstrate appropriate understanding and performance of initially prescribed HEP in order to facilitate improved independence with management of symptoms.  Baseline: HEP provided on eval Goal status: INITIAL   2. Pt will score less than or equal to 33% on NDI in order to demonstrate improved perception of function due to symptoms.  Baseline: 40%  Goal status: INITIAL   LONG TERM GOALS: Target date: 08/19/2022 Pt will score less than or equal to 22% on NDI in order to demonstrate improved perception of function due to symptoms. Baseline: 40% NDI Goal status: INITIAL  Pt will demonstrate at least 50 degrees of active cervical rotation ROM bilaterally in order to demonstrate improved environmental awareness and safety with driving.  Baseline: see ROM chart above Goal status: INITIAL  Pt will demonstrate at least 4+/5 shoulder MMT for improved symmetry of UE strength and improved tolerance to functional movements.  Baseline: see MMT chart above Goal status: INITIAL   Pt will report/demonstrate ability to standing for greater than half an hour with less than 2 point increase on NPS in order to demonstrate improved tolerance to work activities.  Baseline: up to 10/10 pain with standing (tolerance ~5mn) Goal status: INITIAL  5. Pt will demonstrate/report ability to lift up to 25# with less than 2 pt increase in pain on  NPS in order to demonstrate improved tolerance to caregiving/household duties.  Baseline: avoiding lifting, NT on eval d/t time constraints Goal status: INITIAL  6. Pt will score less than or equal to 25% on ODI in order to demonstrate improved perception of disability due to back pain.   Baseline: 42%   Goal status: INITIAL (06/24/22)  7. Pt will perform 5xSTS in less than 12sec in order to indicate reduced fall risk and improved functional mobility (cutoff score 12-14sec, MCID 2.3sec)  Baseline: 17sec w/ gentle UE support  Goal status: INITIAL (06/24/22)    PLAN (updated 06/24/22):  PT FREQUENCY: 1-2x/week   PT DURATION: 8 weeks  PLANNED INTERVENTIONS: Therapeutic exercises, Therapeutic activity, Neuromuscular re-education, Balance training, Gait training, Patient/Family education, Self Care, Joint mobilization, Stair training, Aquatic Therapy, Dry Needling, Electrical stimulation, Spinal mobilization, Cryotherapy, Moist heat, Taping, Manual therapy, and Re-evaluation  PLAN FOR NEXT SESSION:  Progress ROM/strengthening exercises as able/appropriate, review HEP.    DLeeroy ChaPT, DPT 07/03/2022 2:53 PM    Check all possible CPT codes: [phone]- PT Re-evaluation, 97110- Therapeutic Exercise, [phone] Neuro Re-education, [phone]- Gait Training, [phone]- Manual Therapy, [phone]- Therapeutic Activities, [phone]- Self Care, and [phone]- Aquatic therapy    Check all conditions that are expected to impact treatment: Musculoskeletal disorders; multiple treatment areas   If treatment provided at initial evaluation, no treatment charged due to lack of authorization.

## 2022-07-03 ENCOUNTER — Encounter: Payer: Self-pay | Admitting: Physical Therapy

## 2022-07-03 ENCOUNTER — Ambulatory Visit: Payer: Medicaid Other | Admitting: Physical Therapy

## 2022-07-03 ENCOUNTER — Encounter: Payer: Self-pay | Admitting: Obstetrics and Gynecology

## 2022-07-03 ENCOUNTER — Ambulatory Visit: Payer: Medicaid Other | Admitting: Obstetrics and Gynecology

## 2022-07-03 VITALS — BP 136/99 | HR 77 | Ht 66.75 in | Wt 273.0 lb

## 2022-07-03 DIAGNOSIS — M542 Cervicalgia: Secondary | ICD-10-CM

## 2022-07-03 DIAGNOSIS — M6281 Muscle weakness (generalized): Secondary | ICD-10-CM | POA: Diagnosis not present

## 2022-07-03 DIAGNOSIS — N393 Stress incontinence (female) (male): Secondary | ICD-10-CM

## 2022-07-03 DIAGNOSIS — R351 Nocturia: Secondary | ICD-10-CM

## 2022-07-03 DIAGNOSIS — N3281 Overactive bladder: Secondary | ICD-10-CM

## 2022-07-03 DIAGNOSIS — D251 Intramural leiomyoma of uterus: Secondary | ICD-10-CM | POA: Diagnosis not present

## 2022-07-03 DIAGNOSIS — R293 Abnormal posture: Secondary | ICD-10-CM | POA: Diagnosis not present

## 2022-07-03 DIAGNOSIS — R350 Frequency of micturition: Secondary | ICD-10-CM

## 2022-07-03 DIAGNOSIS — M5459 Other low back pain: Secondary | ICD-10-CM | POA: Diagnosis not present

## 2022-07-03 LAB — POCT URINALYSIS DIPSTICK
Bilirubin, UA: NEGATIVE
Blood, UA: NEGATIVE
Glucose, UA: NEGATIVE
Ketones, UA: NEGATIVE
Leukocytes, UA: NEGATIVE
Nitrite, UA: NEGATIVE
Protein, UA: POSITIVE — AB
Spec Grav, UA: >=1.03 — AB (ref 1.010–1.025)
Urobilinogen, UA: 1 E.U./dL
pH, UA: 5.5 (ref 5.0–8.0)

## 2022-07-03 MED ORDER — SOLIFENACIN SUCCINATE 5 MG PO TABS: 5.0000 mg | ORAL_TABLET | Freq: Every day | ORAL | 5 refills | Status: DC

## 2022-07-03 NOTE — Patient Instructions (Signed)
Today we talked about ways to manage bladder urgency such as altering your diet to avoid irritative beverages and foods (bladder diet) as well as attempting to decrease stress and other exacerbating factors.    Stop drinking 2-3 hours prior to bedtime. Goal for water is about 40- 60oz per day.   The Most Bothersome Foods* The Least Bothersome Foods*  Coffee - Regular & Decaf Tea - caffeinated Carbonated beverages - cola, non-colas, diet & caffeine-free Alcohols - Beer, Red Wine, White Wine, Champagne Fruits - Grapefruit, [place], Orange, [employer] - Cranberry, Grapefruit, Orange, Pineapple Vegetables - Tomato & Tomato Products Flavor Enhancers - Hot peppers, Spicy foods, Chili, Horseradish, Vinegar, Monosodium glutamate (MSG) Artificial Sweeteners - NutraSweet, Sweet 'N Low, Equal (sweetener), Saccharin Ethnic foods - [place], [place], [place] food [employer] - low-fat & whole Fruits - Bananas, Blueberries, Honeydew melon, Pears, Raisins, Watermelon Vegetables - Broccoli, Brussels Sprouts, [place], Carrots, Cauliflower, [place], Cucumber, Mushrooms, Peas, Radishes, Squash, Zucchini, White potatoes, Sweet potatoes & yams Poultry - Chicken, Eggs, [place], [employer] - Beef, Programmer, multimedia, Lamb Seafood - Shrimp, [place] fish, Salmon Grains - Oat, Rice Snacks - Pretzels, Popcorn  *Lissa Morales et al. Diet and its role in interstitial cystitis/bladder pain syndrome (IC/BPS) and comorbid conditions. [place] 2012 Jan 11.

## 2022-07-03 NOTE — Progress Notes (Signed)
[facility] New Patient Evaluation and Consultation  Referring Provider: Ladell Pier, MD PCP: Ladell Pier, MD Date of Service: 07/03/2022  SUBJECTIVE Chief Complaint: New Patient (Initial Visit) Brenda Colon is a 42 y.o. female here for a consult for urinary frequency and stress incontinence. )  History of Present Illness: Brenda Colon is a 42 y.o. Black or African-American female seen in consultation at the request of Dr. Wynetta Emery for evaluation of urinary frequency and incontinence.    Review of records significant for: Frequent urination at night- about every hr. She does not feel like she completely empties and has to strain. Some incontinence if she sneeze or laugh too hard   Urinary Symptoms: Leaks urine with cough/ sneeze Leaks only if she has a strong sneeze or allergy season Pad use: none She is bothered by her UI symptoms.  Day time voids 10.  Nocturia: every 30 min-1 hr at night to void. Frequency started in the last few years. Does not feel she gets enough sleep.  Voiding dysfunction: she does not empty her bladder well.  does not use a catheter to empty bladder.  When urinating, she feels the need to urinate multiple times in a row and to push on her belly or vagina to empty bladder Drinks: 1 large cup coffee in AM, 2-3 small bottles pepsi, very little water (maybe one bottle) per day.  Drinks before she goes to bed. Also drinks soda in the middle of the night (or juice).  It is not always the urge to urinate that wakes her. She does snore. She feels that she is always tired and does not get good rest. Has not had a sleep study.   UTIs:  0  UTI's in the last year.   Denies history of blood in urine and kidney or bladder stones  Pelvic Organ Prolapse Symptoms:                  She Denies a feeling of a bulge the vaginal area.  Bowel Symptom: Bowel movements: daily or every other day Stool consistency: soft  Straining: no.  Splinting:  no.  Incomplete evacuation: no.  Denies accidental bowel leakage / fecal incontinence Bowel regimen: none  Sexual Function Sexually active: not currently.  Sexual orientation: Straight Pain with sex: Yes, deep in the pelvis  Pelvic Pain Denies pelvic pain   Past Medical History:  Past Medical History:  Diagnosis Date   Hypertension    STD (sexually transmitted disease)    Gonorrhea,Trich   Uterine fibroid      Past Surgical History:   Past Surgical History:  Procedure Laterality Date   CYSTECTOMY     Wrist   INDUCED ABORTION       Past OB/GYN History: OB History  Gravida Para Term Preterm AB Living  '4 1 1   2 1  '$ SAB IAB Ectopic Multiple Live Births  1 1   0 1    # Outcome Date GA Lbr Len/2nd Weight Sex Delivery Anes PTL Lv  4 Gravida           3 Term 01/01/18 [id number] / 01:39 7 lb 1.8 oz (3.225 kg) M Vag-Vacuum EPI  LIV  2 SAB 2008          1 IAB            Patient's last menstrual period was 06/06/2022. Contraception: none. She does want more children but has not been actively trying Last  pap smear was 03/2022- negative.    From [place] in 05/2017: right fundal intramural fibroid 11 x 9. 6 x 9 cm displaces gestational sac. Second fibroid 25 x 17 mm   Medications: She has a current medication list which includes the following prescription(s): amlodipine, omeprazole, solifenacin, spironolactone, methocarbamol, naproxen, and [DISCONTINUED] nifedipine.   Allergies: Patient has No Known Allergies.   Social History:  Social History   Tobacco Use   Smoking status: Never   Smokeless tobacco: Never  Vaping Use   Vaping Use: Never used  Substance Use Topics   Alcohol use: No   Drug use: No    Relationship status: long-term partner She lives with son.   She is employed as a [employer] certified accountant. Regular exercise: No History of abuse: No  Family History:   Family History  Problem Relation Age of Onset   Diabetes Mother    Diabetes Father    Lupus Sister       Review of Systems: Review of Systems  Constitutional:  Positive for malaise/fatigue. Negative for fever and weight loss.  Respiratory:  Negative for cough, shortness of breath and wheezing.   Cardiovascular:  Negative for chest pain, palpitations and leg swelling.  Gastrointestinal:  Negative for abdominal pain and blood in stool.  Genitourinary:  Negative for dysuria.  Musculoskeletal:  Negative for myalgias.  Skin:  Negative for rash.  Neurological:  Negative for dizziness and headaches.  Endo/Heme/Allergies:  Does not bruise/bleed easily.  Psychiatric/Behavioral:  Negative for depression. The patient is not nervous/anxious.      OBJECTIVE Physical Exam: Vitals:   07/03/22 0814 07/03/22 0903  BP: (!) 131/91 (!) 136/99  Pulse: 77 77  Weight: 273 lb (123.8 kg)   Height: 5' 6.75" (1.695 m)     Physical Exam Constitutional:      General: She is not in acute distress. Pulmonary:     Effort: Pulmonary effort is normal.  Abdominal:     General: There is no distension.     Palpations: Abdomen is soft.     Tenderness: There is no abdominal tenderness. There is no rebound.  Musculoskeletal:        General: No swelling. Normal range of motion.  Skin:    General: Skin is warm and dry.     Findings: No rash.  Neurological:     Mental Status: She is alert and oriented to person, place, and time.  Psychiatric:        Mood and Affect: Mood normal.        Behavior: Behavior normal.      GU / Detailed Urogynecologic Evaluation:  Pelvic Exam: Normal external female genitalia; Bartholin's and Skene's glands normal in appearance; urethral meatus normal in appearance, no urethral masses or discharge.   CST: negative  Speculum exam reveals normal vaginal mucosa without atrophy. Cervix normal appearance. Uterus normal single, nontender. Exam limited due to body habitus. Adnexa no mass, fullness, tenderness.     Pelvic floor strength I/V  Pelvic floor musculature: Right  levator non-tender, Right obturator non-tender, Left levator non-tender, Left obturator non-tender  POP-Q:   POP-Q  -3                                            Aa   -3  Ba  -8                                              C   4                                            Gh  4.5                                            Pb  11.5                                            tvl   -1                                            Ap  -1                                            Bp  -10.5                                              D      Rectal Exam:  Normal external rectum  Post-Void Residual (PVR) by Bladder Scan: In order to evaluate bladder emptying, we discussed obtaining a postvoid residual and she agreed to this procedure.  Procedure: The ultrasound unit was placed on the patient's abdomen in the suprapubic region after the patient had voided. A PVR of 13 ml was obtained by bladder scan.  Laboratory Results: POC urine: negative   ASSESSMENT AND PLAN Ms. Hoeschen is a 42 y.o. with:  1. Overactive bladder   2. SUI (stress urinary incontinence, female)   3. Nocturia   4. Urinary frequency   5. Intramural leiomyoma of uterus    OAB/ Nocturia - We discussed the symptoms of overactive bladder (OAB), which include urinary urgency, urinary frequency, nocturia, with or without urge incontinence.  While we do not know the exact etiology of OAB, several treatment options exist. We discussed management including behavioral therapy (decreasing bladder irritants, urge suppression strategies, timed voids, bladder retraining), physical therapy, medication. - She is drinking very little water, mostly coffee and soda. Recommended decreasing bladder irritants and drinking more water throughout the day. Avoid drinking soda, especially in evening at night.  - Stop drinking 2-3 hours prior to bedtime.  - Prescribed Vesicare '5mg'$  daily. For  anticholinergic medications, we discussed the potential side effects of anticholinergics including dry eyes, dry mouth, constipation, cognitive impairment and urinary retention. We discussed stopping this medication if she does become pregnant.  - Sleep issues/ nocturia may be due to snoring- she will discuss with PCP about obtaining a sleep study.  2. SUI - For treatment of stress urinary incontinence,  non-surgical options include expectant management, weight loss, physical therapy, as well as a pessary.  Surgical options include a midurethral sling, Burch urethropexy, and transurethral injection of a bulking agent. - She will start with pelvic PT, referral placed  3. Fibroid uterus - 11cm fibroid noted on 2018 [place]. Pt has concerns about this and trying to become pregnant.  - Difficult to feel uterine fundus due to body habitus. Will obtain TVUS to assess fibroids.  - Referral placed to general OBGYN for discussion and prenatal counseling  Return 6 weeks for follow up  Jaquita Folds, MD

## 2022-07-15 ENCOUNTER — Ambulatory Visit: Payer: Medicaid Other | Admitting: Obstetrics and Gynecology

## 2022-07-15 ENCOUNTER — Ambulatory Visit (HOSPITAL_COMMUNITY): Payer: Medicaid Other

## 2022-07-15 ENCOUNTER — Encounter: Payer: Self-pay | Admitting: Internal Medicine

## 2022-07-16 ENCOUNTER — Ambulatory Visit
Admission: RE | Admit: 2022-07-16 | Discharge: 2022-07-16 | Disposition: A | Discharge status: Home / Self Care | Payer: Medicaid Other | Source: Ambulatory Visit | Attending: Obstetrics and Gynecology | Admitting: Obstetrics and Gynecology

## 2022-07-16 DIAGNOSIS — D252 Subserosal leiomyoma of uterus: Secondary | ICD-10-CM | POA: Diagnosis not present

## 2022-07-16 DIAGNOSIS — D251 Intramural leiomyoma of uterus: Secondary | ICD-10-CM | POA: Insufficient documentation

## 2022-07-21 ENCOUNTER — Encounter: Payer: Self-pay | Admitting: Internal Medicine

## 2022-07-21 ENCOUNTER — Ambulatory Visit: Payer: Medicaid Other | Attending: Internal Medicine | Admitting: Internal Medicine

## 2022-07-21 VITALS — BP 112/82 | HR 77 | Temp 98.2°F | Ht 66.0 in | Wt 276.0 lb

## 2022-07-21 DIAGNOSIS — G8929 Other chronic pain: Secondary | ICD-10-CM | POA: Diagnosis not present

## 2022-07-21 DIAGNOSIS — M47896 Other spondylosis, lumbar region: Secondary | ICD-10-CM

## 2022-07-21 DIAGNOSIS — I10 Essential (primary) hypertension: Secondary | ICD-10-CM | POA: Diagnosis not present

## 2022-07-21 DIAGNOSIS — M542 Cervicalgia: Secondary | ICD-10-CM

## 2022-07-21 MED ORDER — AMLODIPINE BESYLATE 10 MG PO TABS: 10.0000 mg | ORAL_TABLET | Freq: Every day | ORAL | 1 refills | Status: DC

## 2022-07-21 MED ORDER — MELOXICAM 15 MG PO TABS: 15.0000 mg | ORAL_TABLET | Freq: Every day | ORAL | 1 refills | Status: DC

## 2022-07-21 MED ORDER — SPIRONOLACTONE 25 MG PO TABS: 25.0000 mg | ORAL_TABLET | Freq: Every day | ORAL | 1 refills | Status: DC

## 2022-07-21 NOTE — Progress Notes (Signed)
Patient ID: Brenda Colon, female    DOB: 06-19-1980  MRN: [id number]  CC: Hypertension (HTN f/u./Discuss extending work note/Already received flu vax.)   Subjective: Brenda Colon is a 42 y.o. female who presents for chronic ds management Her concerns today include:  Pt with hx of HTN, PreDM, obesity   HTN: On last visit with me, patient had swelling in the lower extremities so we decreased amlodipine to 5 mg daily and added spironolactone 12.5 mg.  She had some low potassium on chemistry.  Since then she had seen the cardiologist Dr. Johney Frame.  Patient was still having lower extremity edema.  Norvasc stopped.  Placed on valsartan, continued on spironolactone 12.5 mg daily.  Repeat potassium level was normal.  Echo revealed normal EF and no RWMA. BNP was nl.  -pt did not start the Diovan because she may want to have another baby. She stayed on the Norvasc 19m, however on last RF 3 wks ago pharmacy gave her 10 mg.  So she has been taking the 10 mg again.  Spironolactone 25 mg too small to cut in 1/2 so she has been taking the full pill.  No further swelling in legs. Takes Norvasc at nights and Spironolactone in a.m.   On last visit with me, she was reporting neck pain consistent with cervical radiculopathy.  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  No spinal canal stenosis.  Work note given with work restrictions.  Referred to neurosurgeon.  He referred her for PT.  Updated letter for work restrictions given again 05/20/2022 to last 3 months.  Saw P.T 3 times.  Suppose to have 3 more visits but she has not scheduled as yet due to busy schedule.  Neck is better but on days when she is busy at work, she can feel the neck pain Lower back pain is constent.  No radiation.  No numbness tingling.  Wants to get extension on work restrictions until complete P.T.  Works in [employer]  Hopes to transition out of this  role soon; has applied for other positions.   Saw PA 05/06/2022 complaining of mid lower back pain x 1 week.  No radicular symptoms.  Placed on Naprosyn and Robaxin.  X-ray revealed mild DJD of the lumbar spine.  No longer taking Naprosyn or Robaxin.  Obesity:  drinks one small bottle of Pepsi at nights.  Other than that she drinks only water Tends to eat more on wkends.  Eats a steak occasionally.  Eating more fish Loves potatoe bread Patient Active Problem List   Diagnosis Date Noted   [place]bid [place] 02/10/2022   GERD without esophagitis 02/10/2022   Prediabetes 02/10/2022   Hyperlipemia, mixed 11/30/2020   Esophageal dysphagia 11/29/2020   Essential hypertension 11/29/2020   Intrauterine pregnancy 08/21/2020   Pregnancy with uncertain fetal viability 08/21/2020   Chronic hypertension affecting pregnancy 12/30/2017   Family history of microcephaly 09/14/2017   Fibroid 08/15/2017   Chronic hypertension during pregnancy, antepartum 03/26/2017   GERD with esophagitis 03/26/2017     Current Outpatient Medications on File Prior to Visit  Medication Sig Dispense Refill   amLODipine (NORVASC) 5 MG tablet Take 5 mg by mouth daily.     omeprazole (PRILOSEC) 20 MG capsule Take 1 capsule (20 mg total) by mouth daily. 30 capsule 3   spironolactone (ALDACTONE) 25 MG tablet Take 0.5 tablets (12.5 mg total) by mouth daily. 30 tablet  3   methocarbamol (ROBAXIN) 500 MG tablet Take 2 tablets (1,000 mg total) by mouth every 8 (eight) hours as needed for muscle spasms. X 7 days then as needed (Patient not taking: Reported on 07/03/2022) 90 tablet 0   naproxen (NAPROSYN) 500 MG tablet Take 1 tablet (500 mg total) by mouth 2 (two) times daily with a meal. X 7 days then prn pain (Patient not taking: Reported on 07/03/2022) 60 tablet 0   solifenacin (VESICARE) 5 MG tablet Take 1 tablet (5 mg total) by mouth daily. (Patient not taking: Reported on 07/21/2022) 30 tablet 5   [DISCONTINUED] NIFEdipine  (PROCARDIA-XL/ADALAT-CC/NIFEDICAL-XL) 30 MG 24 hr tablet Take by mouth once per day. 30 tablet 2   No current facility-administered medications on file prior to visit.    No Known Allergies  Social History   Socioeconomic History   Marital status: Single    Spouse name: Not on file   Number of children: Not on file   Years of education: Not on file   Highest education level: Not on file  Occupational History   Not on file  Tobacco Use   Smoking status: Never   Smokeless tobacco: Never  Vaping Use   Vaping Use: Never used  Substance and Sexual Activity   Alcohol use: No   Drug use: No   Sexual activity: Not Currently    Partners: Male    Birth control/protection: None    Comment: 1st intercourse 14 yo-5 partners  Other Topics Concern   Not on file  Social History Narrative   Not on file   Social Determinants of Health   Financial Resource Strain: Not on file  Food Insecurity: Not on file  Transportation Needs: Not on file  Physical Activity: Not on file  Stress: Not on file  Social Connections: Not on file  Intimate Partner Violence: Not on file    Family History  Problem Relation Age of Onset   Diabetes Mother    Diabetes Father    Lupus Sister     Past Surgical History:  Procedure Laterality Date   CYSTECTOMY     Wrist   INDUCED ABORTION      ROS: Review of Systems Negative except as stated above  PHYSICAL EXAM: BP 112/82 (BP Location: Left Arm, Patient Position: Sitting, Cuff Size: Large)   Pulse 77   Temp 98.2 F (36.8 C) (Oral)   Ht 5' 6"$  (1.676 m)   Wt 276 lb (125.2 kg)   LMP 06/06/2022   SpO2 99%   BMI 44.55 kg/m   Wt Readings from Last 3 Encounters:  07/21/22 276 lb (125.2 kg)  07/03/22 273 lb (123.8 kg)  05/06/22 279 lb 3.2 oz (126.6 kg)    Physical Exam  General appearance - alert, well appearing, obese middle-age African-American female and in no distress Mental status - normal mood, behavior, speech, dress, motor activity,  and thought processes Neck - supple, no significant adenopathy Chest - clear to auscultation, no wheezes, rales or rhonchi, symmetric air entry Heart - normal rate, regular rhythm, normal S1, S2, no murmurs, rubs, clicks or gallops Extremities - peripheral pulses normal, no pedal edema, no clubbing or cyanosis      Latest Ref Rng & Units 03/27/2022    3:46 PM 03/03/2022    8:34 AM 02/27/2022    2:15 PM  CMP  Glucose 70 - 99 mg/dL 112  104  88   BUN 6 - 24 mg/dL 10  13  9  Creatinine 0.57 - 1.00 mg/dL 0.86  0.87  0.92   Sodium 134 - 144 mmol/L 139  138  139   Potassium 3.5 - 5.2 mmol/L 3.9  3.0  3.5   Chloride 96 - 106 mmol/L 103  106  104   CO2 20 - 29 mmol/L 22  25  26   $ Calcium 8.7 - 10.2 mg/dL 9.4  8.7  8.9    Lipid Panel     Component Value Date/Time   CHOL 206 (H) 11/29/2020 0935   TRIG 85 11/29/2020 0935   HDL 43 11/29/2020 0935   CHOLHDL 4.8 (H) 11/29/2020 0935   LDLCALC 148 (H) 11/29/2020 0935    CBC    Component Value Date/Time   WBC 7.0 03/03/2022 0834   RBC 4.55 03/03/2022 0834   HGB 11.9 (L) 03/03/2022 0834   HGB 13.3 01/15/2022 1448   HCT 37.3 03/03/2022 0834   HCT 41.4 01/15/2022 1448   PLT 250 03/03/2022 0834   PLT 297 01/15/2022 1448   MCV 82.0 03/03/2022 0834   MCV 81 01/15/2022 1448   MCH 26.2 03/03/2022 0834   MCHC 31.9 03/03/2022 0834   RDW 15.0 03/03/2022 0834   RDW 14.3 01/15/2022 1448   LYMPHSABS 2.3 03/03/2022 0834   LYMPHSABS 2.8 01/15/2022 1448   MONOABS 0.6 03/03/2022 0834   EOSABS 0.1 03/03/2022 0834   EOSABS 0.1 01/15/2022 1448   BASOSABS 0.1 03/03/2022 0834   BASOSABS 0.0 01/15/2022 1448    ASSESSMENT AND PLAN: 1. Essential hypertension Looks to goal.  Patient back on Norvasc 10 mg daily and has self increased spironolactone to 25 mg.  No further lower extremity edema.  We will continue her on these doses.  Let me know if and when she plans to become pregnant. - amLODipine (NORVASC) 10 MG tablet; Take 1 tablet (10 mg total) by  mouth daily.  Dispense: 90 tablet; Refill: 1 - spironolactone (ALDACTONE) 25 MG tablet; Take 1 tablet (25 mg total) by mouth daily.  Dispense: 90 tablet; Refill: 1  2. Morbid obesity ([place]) Patient advised to eliminate sugary drinks from the diet, cut back on portion sizes especially of white carbohydrates, eat more white lean meat like chicken [place] and seafood instead of beef or pork and incorporate fresh fruits and vegetables into the diet daily. Encouraged her to try to get in some exercise.  Patient states that she has been so busy she tries to do her best nonetheless. - Amb ref to Medical Nutrition Therapy-MNT  3. Other osteoarthritis of spine, lumbar region Will put her on meloxicam to take daily. Strongly encouraged weight loss.  4. Chronic neck pain See #3 above. Note given for work extending work restrictions of no lifting, pushing or pulling no more than 10 pounds for 2 months.  Advised to call and schedule her next 3 PT sessions.     Patient was given the opportunity to ask questions.  Patient verbalized understanding of the plan and was able to repeat key elements of the plan.   This documentation was completed using Radio producer.  Any transcriptional errors are unintentional.  No orders of the defined types were placed in this encounter.    Requested Prescriptions    No prescriptions requested or ordered in this encounter    No follow-ups on file.  Karle Plumber, MD, FACP

## 2022-07-27 ENCOUNTER — Encounter: Payer: Self-pay | Admitting: *Deleted

## 2022-07-29 ENCOUNTER — Other Ambulatory Visit: Payer: Self-pay | Admitting: Internal Medicine

## 2022-07-29 DIAGNOSIS — K219 Gastro-esophageal reflux disease without esophagitis: Secondary | ICD-10-CM

## 2022-08-05 NOTE — Therapy (Unsigned)
OUTPATIENT PHYSICAL THERAPY FEMALE PELVIC EVALUATION   Patient Name: Brenda Colon MRN: [id number] DOB:[birth year], 42 y.o., female Today's Date: 08/06/2022  END OF SESSION:  PT End of Session - 08/06/22 0903     Visit Number 1    Date for PT Re-Evaluation 10/29/22    Authorization Type [employer]    PT Start Time 0830    PT Stop Time 0915    PT Time Calculation (min) 45 min    Activity Tolerance Patient tolerated treatment well    Behavior During Therapy [facility] for tasks assessed/performed             Past Medical History:  Diagnosis Date   Hypertension    STD (sexually transmitted disease)    Gonorrhea,Trich   Uterine fibroid    Past Surgical History:  Procedure Laterality Date   CYSTECTOMY     Wrist   INDUCED ABORTION     Patient Active Problem List   Diagnosis Date Noted   Morbid obesity ([place]) 02/10/2022   GERD without esophagitis 02/10/2022   Prediabetes 02/10/2022   Hyperlipemia, mixed 11/30/2020   Esophageal dysphagia 11/29/2020   Essential hypertension 11/29/2020   Intrauterine pregnancy 08/21/2020   Pregnancy with uncertain fetal viability 08/21/2020   Chronic hypertension affecting pregnancy 12/30/2017   Family history of microcephaly 09/14/2017   Fibroid 08/15/2017   Chronic hypertension during pregnancy, antepartum 03/26/2017   GERD with esophagitis 03/26/2017    PCP: Ladell Pier, MD  REFERRING PROVIDER: Jaquita Folds, MD   REFERRING DIAG:  N39.3 (ICD-10-CM) - SUI (stress urinary incontinence, female)  N32.81 (ICD-10-CM) - Overactive bladder  R35.1 (ICD-10-CM) - Nocturia    THERAPY DIAG:  Muscle weakness (generalized)  Urinary urgency  Rationale for Evaluation and Treatment: Rehabilitation  ONSET DATE: 2017  SUBJECTIVE:                                                                                                                                                                                            SUBJECTIVE STATEMENT: Patient goes to the bathroom a lot. Patient urinates the most at night.  Fluid intake: 2-3 small bottles pepsi, very little water (maybe one bottle) per day.  Drinks before she goes to bed.  PAIN:  Are you having pain? No  PRECAUTIONS: None  WEIGHT BEARING RESTRICTIONS: No  FALLS:  Has patient fallen in last 6 months? No  LIVING ENVIRONMENT: Lives with: lives with their family  OCCUPATION: Nurse tech  PLOF: Independent  PATIENT GOALS: be able to sleep without having to wake up to urinate  PERTINENT HISTORY:  Hypertension ; Uterine fibroid  Sexual abuse: No  BOWEL MOVEMENT: No issues  URINATION: Pain with urination: No Fully empty bladder: No, urinate multiple times in a row and to push on her belly or vagina to empty bladder  Stream: Strong Urgency: Yes at night particularly Frequency: Day time voids 10.  Nocturia: every 30 min-1 hr at night to void.  Leakage: Coughing and Sneezing Pads: No  INTERCOURSE: Not active at this time  PREGNANCY: Vaginal deliveries 1, son is 4 Tearing No    OBJECTIVE:   DIAGNOSTIC FINDINGS:  From [place] in 05/2017: right fundal intramural fibroid 11 x 9. 6 x 9 cm displaces gestational sac. Second fibroid 25 x 17 mm Pelvic floor strength I/V  PVR of 13 ml was obtained by bladder scan.   PATIENT SURVEYS:  UIQ-7: 19   COGNITION: Overall cognitive status: Within functional limits for tasks assessed     SENSATION: Light touch: Appears intact Proprioception: Appears intact   POSTURE: No Significant postural limitations and obese  PELVIC ALIGNMENT:Pelvis in correct alignment  LUMBARAROM/PROM:  A/PROM A/PROM  eval  Flexion Decreased by 25%  Extension Decreased by 50%  Right lateral flexion Decreased by 25%  Left lateral flexion Decreased by 25%  Right rotation Decreased by 50%  Left rotation Decreased by 50%   (Blank rows = not tested)  LOWER EXTREMITY ROM:  Passive ROM Right eval Left eval   Hip flexion 90 90  Hip external rotation 45 30   (Blank rows = not tested)  LOWER EXTREMITY MMT:  MMT Right eval Left eval  Hip extension 3+/5 3+/5  Hip abduction 3-/5 3-/5  Hip adduction 4/5 4/5   PALPATION:   General  Decreased movement of lower rib cage                External Perineal Exam intact                             Internal Pelvic Floor tenderness on the left levator ani and left side of urethra; decreased movement of the pubovaginalis and urethra sphincter. Patient will bear down instead of contract the pelvic floor. She was able to contract the pelvic floor correctly several times after tactile cues.   Patient confirms identification and approves PT to assess internal pelvic floor and treatment Yes  PELVIC MMT:   MMT eval  Vaginal 1/5 with bearing down  (Blank rows = not tested)        TONE: average  PROLAPSE: none  TODAY'S TREATMENT:                                                                                                                              DATE: 08/06/22  EVAL See below   PATIENT EDUCATION:  08/06/22 Education details: Access Code: [email or web address] Person educated: Patient Education method: Explanation, Demonstration, Tactile cues, Verbal cues, and Handouts Education comprehension: verbalized understanding, returned demonstration, verbal cues required,  tactile cues required, and needs further education  HOME EXERCISE PROGRAM: 08/06/22 Access Code: [email or web address] URL: [email or web address] Date: 08/06/2022 Prepared by: Earlie Counts  Exercises - Supine Pelvic Floor Contraction  - 2-3 x daily - 7 x weekly - 1 sets - 10 reps - 10 sec hold  ASSESSMENT:  CLINICAL IMPRESSION: Patient is a 42 y.o. female who was seen today for physical therapy evaluation and treatment for overactive bladder, stress incontinence, and nocturia. Patient reports she will get up in the night 10x due to urgency and not feeling like she has completely  emptied her bladder.  Pelvic floor strength was 1/5 with bearing down. She was able to contract correctly 2 out 5 times with tactile cues. She has tenderness located on the left levator ani and left side of urethra. She has tightness in the pubovaginalis and urethra sphincter. Patient has decreased bilateral hip and lumbar ROM. She has weakness in bilateral hips. She has difficulty with expansion of the lower rib cage. Patient would benefit from skilled therapy to reduce urinary frequency at night and reduce her leakage.   OBJECTIVE IMPAIRMENTS: decreased activity tolerance, decreased coordination, decreased endurance, decreased strength, increased fascial restrictions, and increased muscle spasms.   ACTIVITY LIMITATIONS: sleeping and continence  PARTICIPATION LIMITATIONS: community activity  PERSONAL FACTORS: Fitness and 1-2 comorbidities: Hypertension ; Uterine fibroid  are also affecting patient's functional outcome.   REHAB POTENTIAL: Good  CLINICAL DECISION MAKING: Stable/uncomplicated  EVALUATION COMPLEXITY: Low   GOALS: Goals reviewed with patient? Yes  SHORT TERM GOALS: Target date: 09/03/22  Patient independent with initial HEP for pelvic floor contraction and relaxation.  Baseline: Not educated yet Goal status: INITIAL   LONG TERM GOALS: Target date: 10/29/22  Patient independent with advanced HEP for pelvic floor strength and coordination to reduce urgency and leakage.  Baseline:  Goal status: INITIAL  2.  Patient understands how to use the urge to void technique so she is able to reduce her night time urination to 1-2 times per night.  Baseline: go 10x per night Goal status: INITIAL  3.  Pelvic floor strength increased >/= 3/5 and able to lift and hug therapist finger to reduce her urinary leakage >/= 70%.  Baseline: Pelvic floor strength 1/5 and will bear down.  Goal status: INITIAL  4.  UIQ-7 score is </= 5 due to decreased frustration of her issues due to able to  sleep better at night.  Baseline: UIQ-7: 19 Goal status: INITIAL   PLAN:  PT FREQUENCY: 1x/week  PT DURATION: 12 weeks  PLANNED INTERVENTIONS: Therapeutic exercises, Therapeutic activity, Neuromuscular re-education, Patient/Family education, Joint mobilization, Dry Needling, Cryotherapy, Moist heat, Biofeedback, and Manual therapy  PLAN FOR NEXT SESSION: Manual work on pelvic floor and work on contraction, urge to void, work on rib cage mobility and diaphragmatic breathing   Earlie Counts, PT 08/06/22 12:37 PM

## 2022-08-06 ENCOUNTER — Encounter: Payer: Self-pay | Admitting: Physical Therapy

## 2022-08-06 ENCOUNTER — Other Ambulatory Visit: Payer: Self-pay

## 2022-08-06 ENCOUNTER — Encounter: Payer: Medicaid Other | Attending: Obstetrics and Gynecology | Admitting: Physical Therapy

## 2022-08-06 DIAGNOSIS — M6281 Muscle weakness (generalized): Secondary | ICD-10-CM | POA: Diagnosis not present

## 2022-08-06 DIAGNOSIS — R3915 Urgency of urination: Secondary | ICD-10-CM | POA: Insufficient documentation

## 2022-08-14 ENCOUNTER — Ambulatory Visit: Payer: Medicaid Other | Admitting: Obstetrics and Gynecology

## 2022-08-17 NOTE — Progress Notes (Deleted)
[facility] Return Visit  SUBJECTIVE  History of Present Illness: ASYIA CIHLAR is a 42 y.o. female seen in follow-up for OAB. Plan at last visit was decrease bladder irritants, start PT, and start Vesicare '5mg'$  daily.     Past Medical History: Patient  has a past medical history of Hypertension, STD (sexually transmitted disease), and Uterine fibroid.   Past Surgical History: She  has a past surgical history that includes Cystectomy and Induced abortion.   Medications: She has a current medication list which includes the following prescription(s): amlodipine, meloxicam, omeprazole, spironolactone, and [DISCONTINUED] nifedipine.   Allergies: Patient has No Known Allergies.   Social History: Patient  reports that she has never smoked. She has never used smokeless tobacco. She reports that she does not drink alcohol and does not use drugs.      OBJECTIVE     Physical Exam: There were no vitals filed for this visit. Gen: No apparent distress, A&O x 3.  Detailed Urogynecologic Evaluation:  Deferred. Prior exam showed:      No data to display             ASSESSMENT AND PLAN    Ms. Gervin is a 42 y.o. with:  No diagnosis found.

## 2022-08-18 ENCOUNTER — Encounter: Payer: Self-pay | Admitting: Registered"

## 2022-08-18 ENCOUNTER — Encounter: Payer: Medicaid Other | Attending: Internal Medicine | Admitting: Registered"

## 2022-08-18 ENCOUNTER — Encounter: Payer: Self-pay | Admitting: Internal Medicine

## 2022-08-18 DIAGNOSIS — Z713 Dietary counseling and surveillance: Secondary | ICD-10-CM | POA: Insufficient documentation

## 2022-08-18 DIAGNOSIS — E669 Obesity, unspecified: Secondary | ICD-10-CM

## 2022-08-18 DIAGNOSIS — I10 Essential (primary) hypertension: Secondary | ICD-10-CM | POA: Insufficient documentation

## 2022-08-18 NOTE — Patient Instructions (Addendum)
-   Aim to drink at least 1 bottle of water a day.   - Aim to have water with meals.   - Aim to have 3 meals/day.   - Have vegetables with lunch and dinner.

## 2022-08-18 NOTE — Progress Notes (Signed)
Medical Nutrition Therapy  Appointment Start time:  9:16  Appointment End time:  10:00  Primary concerns today: how to meal plan  Referral diagnosis: morbid obesity Preferred learning style: no preference indicated Learning readiness:  ready, change in progress   NUTRITION ASSESSMENT   Pt arrives stating she doesn't exercise but doesn't feel like she eats the wrong things. States she does not know why she gains so much weight. States she has gained weight over her adult life. States she is currently at her highest weight. States she likes rice but does not make it often. Reports she also likes Zucchini  Broccoli Asparagus Cabbage Cucumber and tomatoes salad (will add eggs sometimes) Catfish  Salmon pasta Pasta and shrimp Hot dogs Chicken nuggets Eggs Lucendia Herrlich  States the past 2 weeks she hasn't gone to work because she hasn't felt well. States she has been home more often, doing school work, and eating has been off track. States she has been snacking a lot during the day. States the past 2 weeks, she hasn't had a big appetite. Reports she used to drink 2-3 sodas/day, coffee, and kool-aid during the week, but has been drinking more water lately.   States she is always tired; denies taking multivitamin. States she is seeing a lot of doctors right now related to increased urination. States she has been waking up a lot throughout hte night and seeing physical therapist for pelvic floor exercises. Reports she has started taking medications and has had 1 physical therapy appt, waking up during the night has decreased to 2-3x/night now. Reports she has fibroids and curious if they are putting pressure on her bladder.   States years ago she was participating in [place] and walking but has not done it since then due to having a pinched nerve in back.   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 Reports her family calls on her a lot and she does a lot for others.     Clinical Medical Hx: HTN Medications: See list Labs: elevated A1c (5.9), elevated Chol (206), elevated LDL-Calc (148) Notable Signs/Symptoms: fatigue  Lifestyle & Dietary Hx  Estimated daily fluid intake: 20 oz Supplements: See list Sleep: 6 hrs/night and getting up throughout the night Stress / self-care: getting lashes and nails done Current average weekly physical activity: none reported  24-Hr Dietary Recall First Meal: typically skips or egg sandwich or boiled eggs + bagel with butter + jelly Snack:  Second Meal: sometimes skips or leftovers or sandwich + chips or hot dogs  Snack:  Third Meal (10 pm): egg with cheese sandwich + apple jacks + milk or salmon or pasta + parmesan cheese + shrimp or fried wings or steak and onions + corn on cob  Snack:  Beverages: Sunny Delight (8 oz), Pepsi (12 oz)  Estimated Energy Needs Calories: 1800 Carbohydrate: 200 g Protein: 135 g Fat: 50 g   NUTRITION DIAGNOSIS  NB-1.1 Food and nutrition-related knowledge deficit As related to having balanced meals.  As evidenced by dietary recall.   NUTRITION INTERVENTION  Nutrition education (E-1) on the following topics: Nutrition education and counseling. Pt was educated on the benefits of eating a variety of food groups at each meal. Discussed the purpose of each food group and ways to create balance with already established regimen. Discussed eating 3 meals a day to help adequately nourish body and ways to increase water intake. Discussed role of mental health providers related to time  management and relationships; provided resources. Pt agreed with goals listed.   Handouts Provided Include  None   Learning Style & Readiness for Change Teaching method utilized: Visual & Auditory  Demonstrated degree of understanding via: Teach Back  Barriers to learning/adherence to lifestyle change: work-life balance  Goals Established by Pt Aim to drink at least 1 bottle of water a day.  Aim to  have water with meals.  Aim to have 3 meals/day.  Have vegetables with lunch and dinner.    MONITORING & EVALUATION Dietary intake, weekly physical activity.  Next Steps  Patient is to follow-up in 6 weeks.

## 2022-08-20 ENCOUNTER — Ambulatory Visit: Payer: Medicaid Other | Admitting: Obstetrics and Gynecology

## 2022-08-25 ENCOUNTER — Ambulatory Visit (INDEPENDENT_AMBULATORY_CARE_PROVIDER_SITE_OTHER): Payer: Medicaid Other | Admitting: Obstetrics and Gynecology

## 2022-08-25 ENCOUNTER — Encounter: Payer: Self-pay | Admitting: Obstetrics and Gynecology

## 2022-08-25 ENCOUNTER — Encounter: Payer: Medicaid Other | Attending: Obstetrics and Gynecology | Admitting: Physical Therapy

## 2022-08-25 ENCOUNTER — Encounter: Payer: Self-pay | Admitting: Physical Therapy

## 2022-08-25 ENCOUNTER — Other Ambulatory Visit: Payer: Self-pay

## 2022-08-25 VITALS — BP 123/90 | HR 105 | Wt 272.8 lb

## 2022-08-25 DIAGNOSIS — R351 Nocturia: Secondary | ICD-10-CM | POA: Diagnosis not present

## 2022-08-25 DIAGNOSIS — Z3169 Encounter for other general counseling and advice on procreation: Secondary | ICD-10-CM

## 2022-08-25 DIAGNOSIS — N393 Stress incontinence (female) (male): Secondary | ICD-10-CM | POA: Diagnosis not present

## 2022-08-25 DIAGNOSIS — R3915 Urgency of urination: Secondary | ICD-10-CM | POA: Diagnosis not present

## 2022-08-25 DIAGNOSIS — N3281 Overactive bladder: Secondary | ICD-10-CM | POA: Insufficient documentation

## 2022-08-25 DIAGNOSIS — D219 Benign neoplasm of connective and other soft tissue, unspecified: Secondary | ICD-10-CM | POA: Diagnosis not present

## 2022-08-25 DIAGNOSIS — M6281 Muscle weakness (generalized): Secondary | ICD-10-CM | POA: Insufficient documentation

## 2022-08-25 NOTE — Therapy (Signed)
OUTPATIENT PHYSICAL THERAPY TREATMENT NOTE   Patient Name: Brenda Colon MRN: [id number] DOB:08/29/1980, 42 y.o., female Today's Date: 08/25/2022  PCP: Ladell Pier, MD  REFERRING PROVIDER: Jaquita Folds, MD   END OF SESSION:   PT End of Session - 08/25/22 0957     Visit Number 2    Authorization Type [employer]    Authorization Time Period 08/06/2022-10/04/2022    Authorization - Visit Number 1    Authorization - Number of Visits 5    PT Start Time 0958    PT Stop Time 1045    PT Time Calculation (min) 47 min    Activity Tolerance Patient tolerated treatment well    Behavior During Therapy [facility] for tasks assessed/performed             Past Medical History:  Diagnosis Date   Hypertension    STD (sexually transmitted disease)    Gonorrhea,Trich   Uterine fibroid    Past Surgical History:  Procedure Laterality Date   CYSTECTOMY     Wrist   INDUCED ABORTION     Patient Active Problem List   Diagnosis Date Noted   Morbid obesity ([place]) 02/10/2022   GERD without esophagitis 02/10/2022   Prediabetes 02/10/2022   Hyperlipemia, mixed 11/30/2020   Esophageal dysphagia 11/29/2020   Essential hypertension 11/29/2020   Intrauterine pregnancy 08/21/2020   Pregnancy with uncertain fetal viability 08/21/2020   Chronic hypertension affecting pregnancy 12/30/2017   Family history of microcephaly 09/14/2017   Fibroid 08/15/2017   Chronic hypertension during pregnancy, antepartum 03/26/2017   GERD with esophagitis 03/26/2017   REFERRING DIAG:  N39.3 (ICD-10-CM) - SUI (stress urinary incontinence, female)  N32.81 (ICD-10-CM) - Overactive bladder  R35.1 (ICD-10-CM) - Nocturia      THERAPY DIAG:  Muscle weakness (generalized)   Urinary urgency   Rationale for Evaluation and Treatment: Rehabilitation   ONSET DATE: 2017   SUBJECTIVE:                                                                                                                                                                                             SUBJECTIVE STATEMENT: I felt good after the initial evaluation. I am getting up less at night since last visit.  Fluid intake: 2-3 small bottles pepsi, very little water (maybe one bottle) per day.  Drinks before she goes to bed.  PAIN:  Are you having pain? No   PRECAUTIONS: None   WEIGHT BEARING RESTRICTIONS: No   FALLS:  Has patient fallen in last 6 months? No   LIVING ENVIRONMENT: Lives with: lives with their family  OCCUPATION: Nurse tech   PLOF: Independent   PATIENT GOALS: be able to sleep without having to wake up to urinate   PERTINENT HISTORY:  Hypertension ; Uterine fibroid  Sexual abuse: No   BOWEL MOVEMENT: No issues   URINATION: Pain with urination: No Fully empty bladder: No, urinate multiple times in a row and to push on her belly or vagina to empty bladder  Stream: Strong Urgency: Yes at night particularly Frequency: Day time voids 10.  Nocturia: every 30 min-1 hr at night to void.  Leakage: Coughing and Sneezing Pads: No   INTERCOURSE: Not active at this time   PREGNANCY: Vaginal deliveries 1, son is 4 Tearing No       OBJECTIVE:    DIAGNOSTIC FINDINGS:  From [place] in 05/2017: right fundal intramural fibroid 11 x 9. 6 x 9 cm displaces gestational sac. Second fibroid 25 x 17 mm Pelvic floor strength I/V  PVR of 13 ml was obtained by bladder scan.    PATIENT SURVEYS:  UIQ-7: 19     COGNITION: Overall cognitive status: Within functional limits for tasks assessed                          SENSATION: Light touch: Appears intact Proprioception: Appears intact     POSTURE: No Significant postural limitations and obese   PELVIC ALIGNMENT:Pelvis in correct alignment   LUMBARAROM/PROM:   A/PROM A/PROM  eval  Flexion Decreased by 25%  Extension Decreased by 50%  Right lateral flexion Decreased by 25%  Left lateral flexion Decreased by 25%  Right rotation  Decreased by 50%  Left rotation Decreased by 50%   (Blank rows = not tested)   LOWER EXTREMITY ROM:   Passive ROM Right eval Left eval  Hip flexion 90 90  Hip external rotation 45 30   (Blank rows = not tested)   LOWER EXTREMITY MMT:   MMT Right eval Left eval  Hip extension 3+/5 3+/5  Hip abduction 3-/5 3-/5  Hip adduction 4/5 4/5    PALPATION:   General  Decreased movement of lower rib cage                 External Perineal Exam intact                             Internal Pelvic Floor tenderness on the left levator ani and left side of urethra; decreased movement of the pubovaginalis and urethra sphincter. Patient will bear down instead of contract the pelvic floor. She was able to contract the pelvic floor correctly several times after tactile cues.    Patient confirms identification and approves PT to assess internal pelvic floor and treatment Yes   PELVIC MMT:   MMT eval 08/25/22  Vaginal 1/5 with bearing down 3/5, 14 S, 3 x  (Blank rows = not tested)         TONE: average   PROLAPSE: none   TODAY'S TREATMENT:       08/25/22 Manual: Internal pelvic floor techniques: No emotional/communication barriers or cognitive limitation. Patient is motivated to learn. Patient understands and agrees with treatment goals and plan. PT explains patient will be examined in standing, sitting, and lying down to see how their muscles and joints work. When they are ready, they will be asked to remove their underwear so PT can examine their perineum. The patient is also given the option of  providing their own chaperone as one is not provided in our facility. The patient also has the right and is explained the right to defer or refuse any part of the evaluation or treatment including the internal exam. With the patient's consent, PT will use one gloved finger to gently assess the muscles of the pelvic floor, seeing how well it contracts and relaxes and if there is muscle symmetry. After,  the patient will get dressed and PT and patient will discuss exam findings and plan of care. PT and patient discuss plan of care, schedule, attendance policy and HEP activities.  Therapist finger going through the vaginal canal performing manual work to the left iliococcygeus, left side of urethra and bladder Exercises: Stretches/mobility: Single knee to chest holding 15 sec bil.  Hamstring stretch with strap holding 30 sec bil.  Butterfly stretch holding 60 sec with yoga block under knees for patient to relax Supine trunk rotation back and forth 10x Strengthening: Contract the pelvic floor holding 14 sec 3 times Contract the pelvic floor quickly 5 times     PATIENT EDUCATION:  08/25/22 Education details: Access Code: [email or web address], stop drinking Pepsi after lunch/before bed Person educated: Patient Education method: Explanation, Demonstration, Tactile cues, Verbal cues, and Handouts Education comprehension: verbalized understanding, returned demonstration, verbal cues required, tactile cues required, and needs further education   HOME EXERCISE PROGRAM: 08/25/22 Access Code: [email or web address] URL: [email or web address] Date: 08/25/2022 Prepared by: Earlie Counts  Exercises - Supine Pelvic Floor Contraction  - 1 x daily - 7 x weekly - 1 sets - 10 reps - 10 sec hold - Supine Single Knee to Chest  - 1 x daily - 7 x weekly - 1 sets - 2 reps - 15 sec hold - Supine Hamstring Stretch with Strap  - 1 x daily - 7 x weekly - 1 sets - 2 reps - 30 sec hold - Supine Butterfly Groin Stretch  - 1 x daily - 7 x weekly - 1 sets - 1 reps - 30-60 sec hold - Supine Lower Trunk Rotation  - 1 x daily - 7 x weekly - 1 sets - 10 reps - Seated Pelvic Floor Contraction  - 2 x daily - 7 x weekly - 1 sets - 5 reps - 5 sec hold   ASSESSMENT:   CLINICAL IMPRESSION: Patient is a 42 y.o. female who was seen today for physical therapy  treatment for overactive bladder, stress incontinence, and nocturia. Patient  feels she is going to the bathroom less since last visit. She was instructed to not drink a Pepsi before bed and only drink it around lunch time to decrease the bladder irritation at night. Pelvic floor strength increased to 3/5 after manual work. She recruits her pelvic floor slowly and relaxes slowly with quick flicks. Patient would benefit from skilled therapy to reduce urinary frequency at night and reduce her leakage.    OBJECTIVE IMPAIRMENTS: decreased activity tolerance, decreased coordination, decreased endurance, decreased strength, increased fascial restrictions, and increased muscle spasms.    ACTIVITY LIMITATIONS: sleeping and continence   PARTICIPATION LIMITATIONS: community activity   PERSONAL FACTORS: Fitness and 1-2 comorbidities: Hypertension ; Uterine fibroid  are also affecting patient's functional outcome.    REHAB POTENTIAL: Good   CLINICAL DECISION MAKING: Stable/uncomplicated   EVALUATION COMPLEXITY: Low     GOALS: Goals reviewed with patient? Yes   SHORT TERM GOALS: Target date: 09/03/22   Patient independent with initial HEP for pelvic floor contraction and relaxation.  Baseline: Not educated yet  Goal status: Met 08/25/22     LONG TERM GOALS: Target date: 10/29/22   Patient independent with advanced HEP for pelvic floor strength and coordination to reduce urgency and leakage.  Baseline:  Goal status: INITIAL   2.  Patient understands how to use the urge to void technique so she is able to reduce her night time urination to 1-2 times per night.  Baseline: go 10x per night Goal status: INITIAL   3.  Pelvic floor strength increased >/= 3/5 and able to lift and hug therapist finger to reduce her urinary leakage >/= 70%.  Baseline: Pelvic floor strength 1/5 and will bear down.  Goal status: INITIAL   4.  UIQ-7 score is </= 5 due to decreased frustration of her issues due to able to sleep better at night.  Baseline: UIQ-7: 19 Goal status: INITIAL      PLAN:   PT FREQUENCY: 1x/week   PT DURATION: 12 weeks   PLANNED INTERVENTIONS: Therapeutic exercises, Therapeutic activity, Neuromuscular re-education, Patient/Family education, Joint mobilization, Dry Needling, Cryotherapy, Moist heat, Biofeedback, and Manual therapy   PLAN FOR NEXT SESSION: urge to void, work on rib cage mobility and diaphragmatic breathing, hip and abdominal strength   Earlie Counts, PT 08/25/22 10:42 AM

## 2022-08-25 NOTE — Progress Notes (Signed)
GYNECOLOGY VISIT  Patient name: Brenda Colon MRN [id number]  Date of birth: Dec 29, 1980 Chief Complaint:   Gynecologic Exam  History:  Brenda Colon is a 42 y.o. [phone] being seen today for fibroid. No issues with pain with intercourse, menses last 4-5 days, not too heavy, no pain with menses, mense have nevery been heavy and may have a heavy day. Trying to get pregnant as well - for the last few months. Menses have always been pretty light  Partner is 28 - he has a son and they have a child together Last pregnancy 2019 - did not need assistance to get pregnant at that time Concerns - due to increasing age; was supposed to discuss fertility and spontaneously conceived   Seen by urogyn 06/2022 and noted to have 11cm in 2018 [place];  07/2022 pelvic US shows multiple small fibroids, largest 3.2 and possible polyp  Past Medical History:  Diagnosis Date   Hypertension    STD (sexually transmitted disease)    Gonorrhea,Trich   Uterine fibroid     Past Surgical History:  Procedure Laterality Date   CYSTECTOMY     Wrist   INDUCED ABORTION      The following portions of the patient's history were reviewed and updated as appropriate: allergies, current medications, past family history, past medical history, past social history, past surgical history and problem list.   Health Maintenance:   Last pap     Component Value Date/Time   DIAGPAP  03/20/2022 1141    - Negative for intraepithelial lesion or malignancy (NILM)   [place] Negative 03/20/2022 1141   ADEQPAP  03/20/2022 1141    Satisfactory for evaluation; transformation zone component PRESENT.  Last mammogram: 04/2022. BIRAD 1    Review of Systems:  Pertinent items are noted in HPI. Comprehensive review of systems was otherwise negative.   Objective:  Physical Exam BP (!) 123/90   Pulse (!) 105   Wt 272 lb 12.8 oz (123.7 kg)   LMP 08/07/2022   BMI 44.03 kg/m    Physical Exam Vitals and nursing note reviewed.   Constitutional:      Appearance: Normal appearance.  HENT:     Head: Normocephalic and atraumatic.  Pulmonary:     Effort: Pulmonary effort is normal.  Skin:    General: Skin is warm and dry.  Neurological:     General: No focal deficit present.     Mental Status: She is alert.  Psychiatric:        Mood and Affect: Mood normal.        Behavior: Behavior normal.        Thought Content: Thought content normal.        Judgment: Judgment normal.      Labs and Imaging US PELVIC COMPLETE WITH TRANSVAGINAL CLINICAL DATA:  Uterine fibroids, LMP 06/06/2022  EXAM: TRANSABDOMINAL AND TRANSVAGINAL ULTRASOUND OF PELVIS  TECHNIQUE: Both transabdominal and transvaginal ultrasound examinations of the pelvis were performed. Transabdominal technique was performed for global imaging of the pelvis including uterus, ovaries, adnexal regions, and pelvic cul-de-sac. It was necessary to proceed with endovaginal exam following the transabdominal exam to visualize the uterus, endometrium, and ovaries.  COMPARISON:  None Available.  FINDINGS: Uterus  Measurements: 10.2 x 6.7 x 6.9 cm = volume: 246 mL. Anteverted. Heterogeneous myometrium. Multiple small nodules consistent with multiple leiomyomata. Largest of these include 3.2 cm fundal leiomyoma and multiple small intramural and subserosal leiomyomata up to 2.3 cm and 2.1 cm.  Endometrium  Thickness: 12 mm. Hyperechoic nodule 12 x 8 x 15 mm question endometrial polyp. Tiny amount of endometrial fluid.  Right ovary  Measurements: 2.2 x 1.4 x 1.1 cm = volume: 1.7 mL. Normal morphology without mass  Left ovary  Measurements: 2.0 x 1.6 x 1.6 cm = volume: 2.8 mL. Normal morphology without mass  Other findings  Trace free pelvic fluid.  No adnexal masses.  IMPRESSION: Multiple small uterine leiomyomata up to 3.2 cm diameter.  Hyperechoic nodule within endometrial complex 12 x 8 x 15 mm question endometrial polyp; consider  sonohysterogram for further evaluation, prior to hysteroscopy or endometrial biopsy.  Remainder of exam unremarkable.  Electronically Signed   By: Lavonia Dana M.D.   On: 07/16/2022 17:22       Assessment & Plan:   1. Fibroid Reviewed pelvic images. Prior 11cm fibroid has reduced in size and not currently having bleeding or blk symptoms. No treatment necessary at this time. Reviewed that fibroids may increase in size once again either with time or with subsequent pregnancies. Recommend continued surveillance for now.   2. Infertility counseling Labs regarding current fertility and correct as needed. Recommend trying to conceive for no more than 6 months and then seeing REI due to age and prior SAB. Also recommend further endometrial investigation - offered sonohysterography vs hysteroscopy and pt would prefer to just have hysteroscopy completed so that if lesion is found it can be removed at that time. Reviewed risks, benefits and alternatives. Message sent to scheduler.  - Anti mullerian hormone - Estradiol - TSH - Hemoglobin A1c   Routine preventative health maintenance measures emphasized.  Darliss Cheney, MD [facility] [place]

## 2022-08-28 NOTE — Progress Notes (Deleted)
[facility] Return Visit  SUBJECTIVE  History of Present Illness: CERA PFOST is a 42 y.o. female seen in follow-up for OAB. Plan at last visit was ***.     Past Medical History: Patient  has a past medical history of Hypertension, STD (sexually transmitted disease), and Uterine fibroid.   Past Surgical History: She  has a past surgical history that includes Cystectomy and Induced abortion.   Medications: She has a current medication list which includes the following prescription(s): amlodipine, meloxicam, omeprazole, spironolactone, and [DISCONTINUED] nifedipine.   Allergies: Patient has No Known Allergies.   Social History: Patient  reports that she has never smoked. She has never used smokeless tobacco. She reports that she does not drink alcohol and does not use drugs.      OBJECTIVE     Physical Exam: There were no vitals filed for this visit. Gen: No apparent distress, A&O x 3.  Detailed Urogynecologic Evaluation:  Deferred. Prior exam showed:      No data to display             ASSESSMENT AND PLAN    Ms. Rease is a 42 y.o. with:  No diagnosis found.

## 2022-08-29 LAB — HEMOGLOBIN A1C
Est. average glucose Bld gHb Est-mCnc: 117 mg/dL
Hgb A1c MFr Bld: 5.7 % — ABNORMAL HIGH (ref 4.8–5.6)

## 2022-08-29 LAB — ESTRADIOL: Estradiol: 40.3 pg/mL

## 2022-08-29 LAB — ANTI MULLERIAN HORMONE: ANTI-MULLERIAN HORMONE (AMH): 0.285 ng/mL

## 2022-08-29 LAB — TSH: TSH: 1.32 u[IU]/mL (ref 0.450–4.500)

## 2022-08-31 ENCOUNTER — Encounter: Payer: Self-pay | Admitting: Obstetrics and Gynecology

## 2022-08-31 ENCOUNTER — Ambulatory Visit: Payer: Medicaid Other | Admitting: Obstetrics and Gynecology

## 2022-09-03 ENCOUNTER — Encounter: Payer: Medicaid Other | Admitting: Physical Therapy

## 2022-09-06 IMAGING — US US OB TRANSVAGINAL
1 series · 15 of 28 positions shown · non-contrast
Comparison: No prior scans from this gestation.

CLINICAL DATA: 39-year-old pregnant female presents with episode of
spotting yesterday. Cramping today. EDC by LMP: 03/04/2021,
projecting to an expected gestational age of 12 weeks 3 days.

EXAM:
TRANSVAGINAL OB ULTRASOUND
TECHNIQUE: Transvaginal ultrasound was performed for complete evaluation of the
gestation as well as the maternal uterus, adnexal regions, and
pelvic cul-de-sac.

[Series 1: us ob transvaginal · 39 acquisitions, 15 frames shown]
[im 1/39]
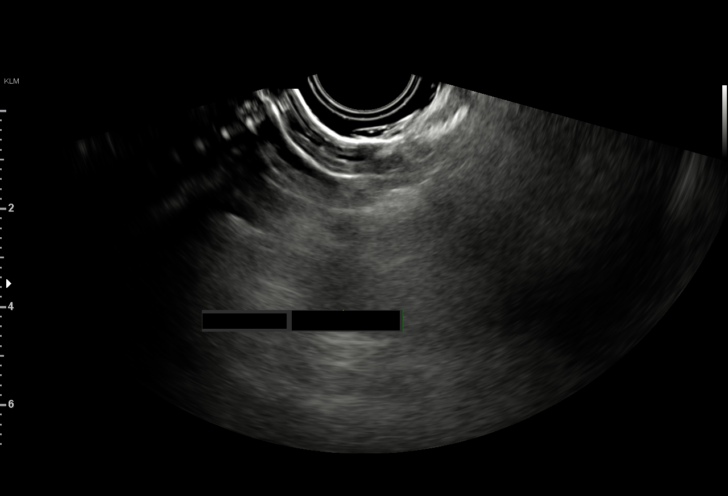
[im 3/39]
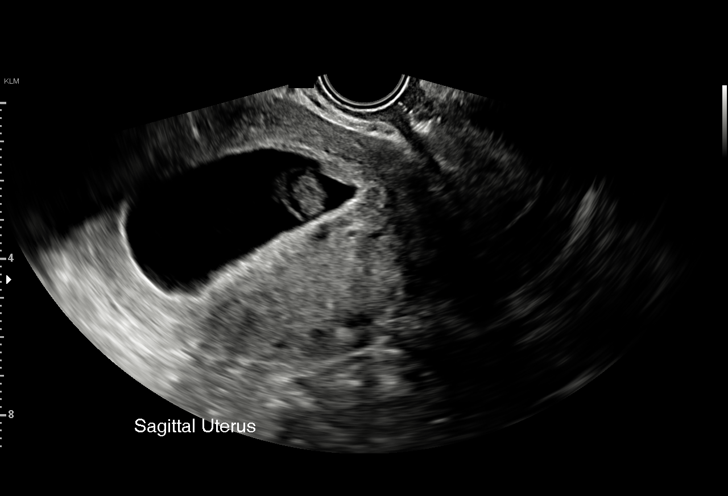
[im 6/39]
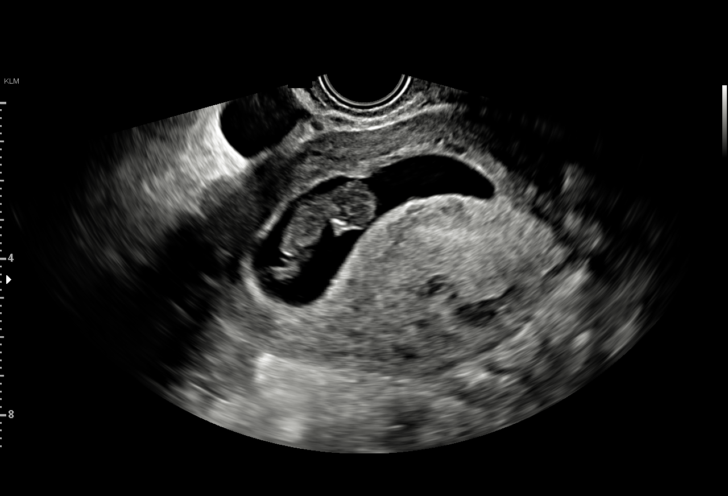
[im 9/39]
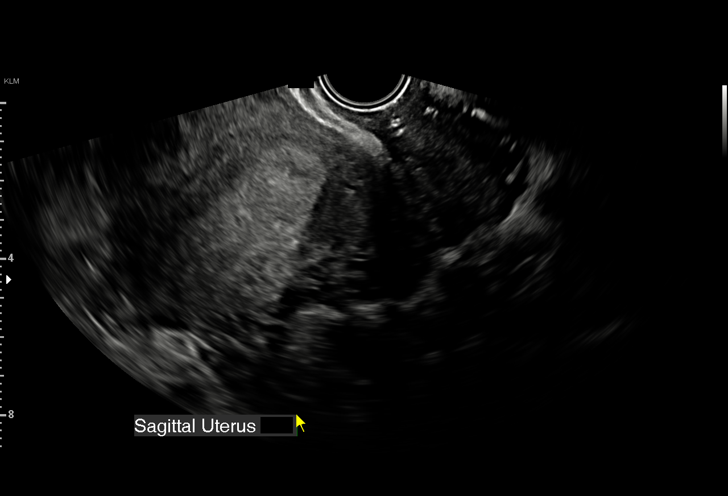
[im 12/39]
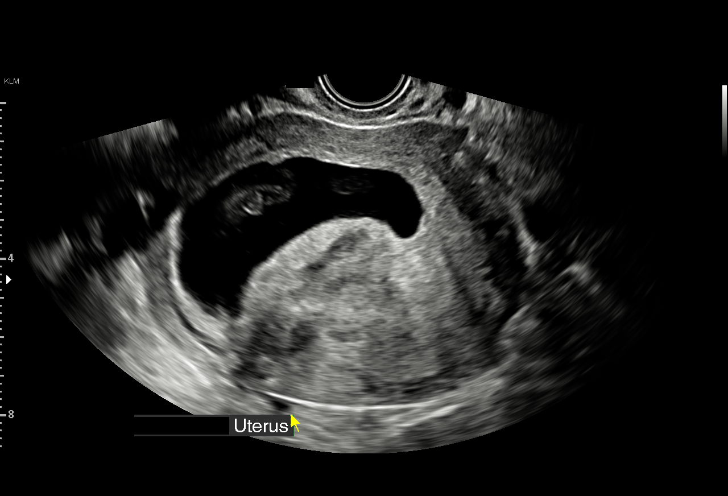
[im 15/39]
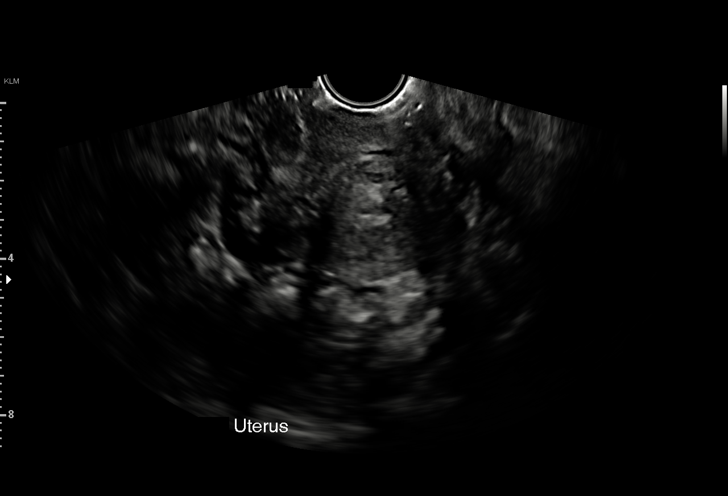
[im 17/39]
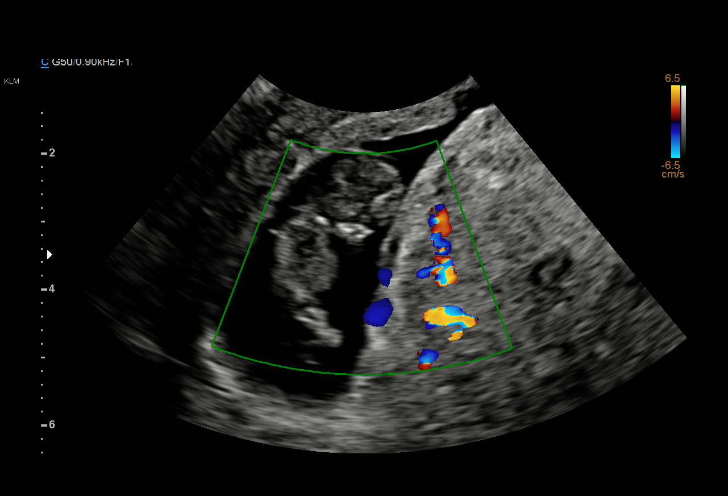
[im 20/39]
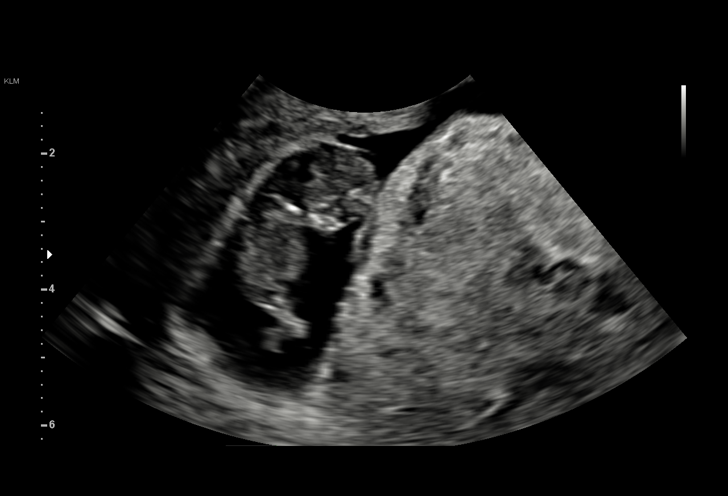
[im 22/39]
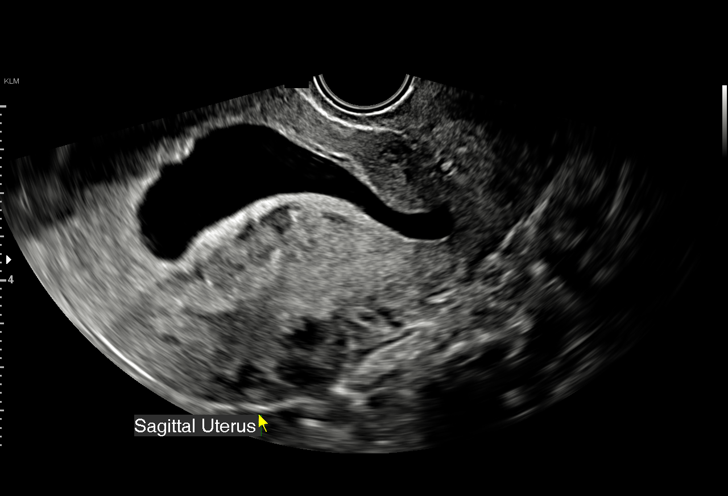
[im 24/39]
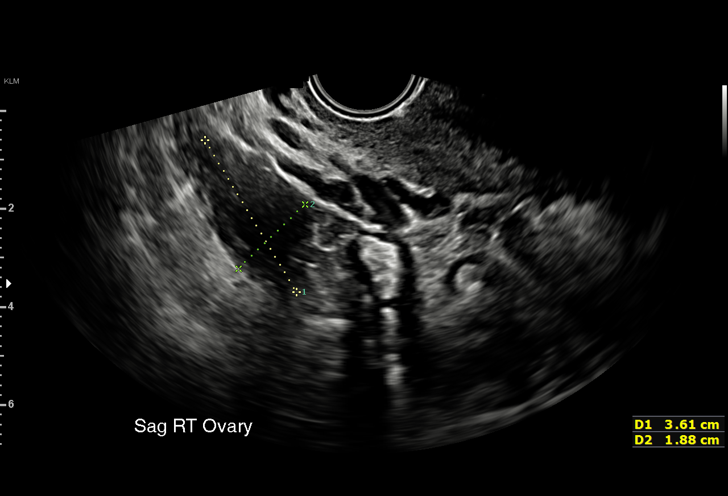
[im 27/39]
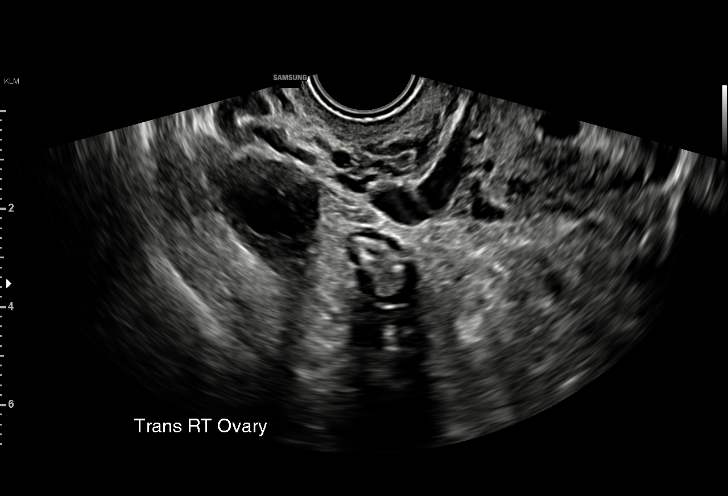
[im 30/39]
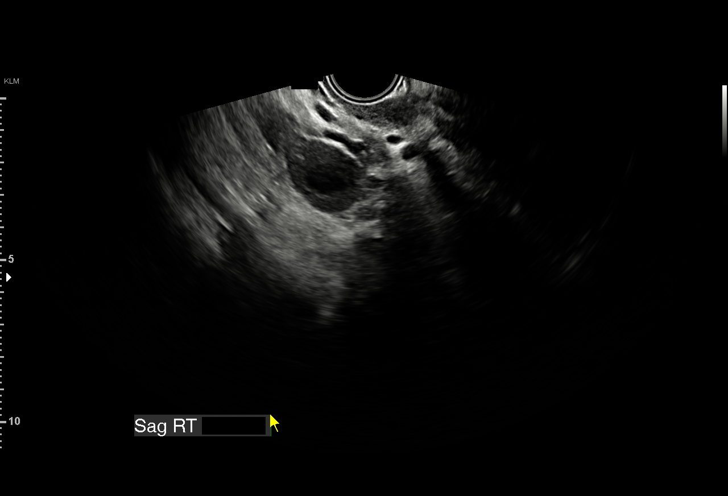
[im 33/39]
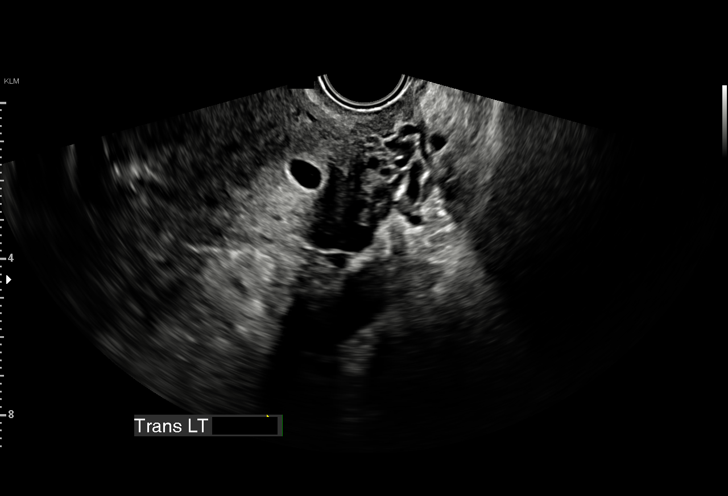
[im 36/39]
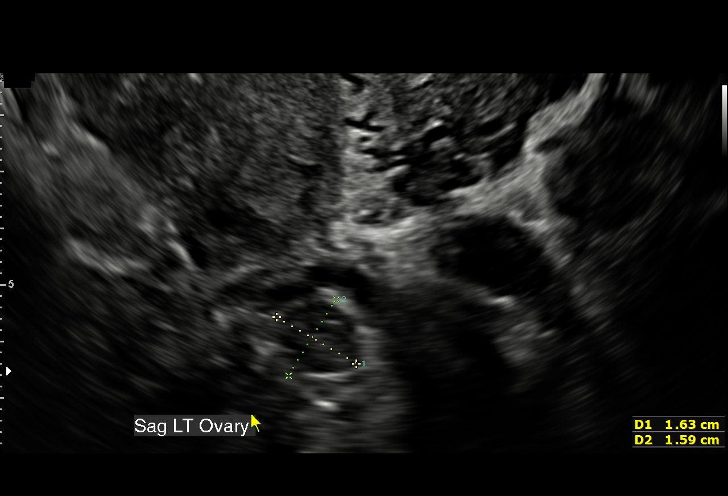
[im 39/39]
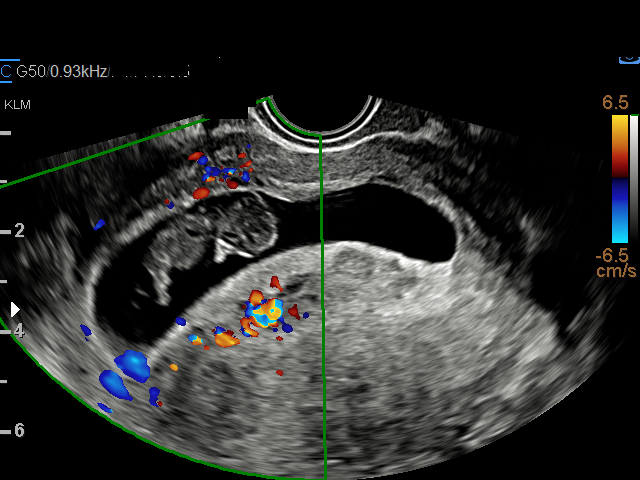

[15 of 28 positions shown; findings below may reference images not displayed]

FINDINGS: Intrauterine gestational sac: Single

Yolk sac:  Not Visualized.

Fetus: Visualized.

Fetal Cardiac Activity: Not Visualized on M-mode, color Doppler or
grayscale cine.

CRL:   27.8 mm   9 w 4 d                  US EDC: 03/24/2021

Subchorionic hemorrhage:  None visualized.

Maternal uterus/adnexae: Right ovary measures 3.6 x 1.9 x 2.1 cm and
contains a small corpus luteum. Left ovary measures 1.6 x 1.6 x
cm. No abnormal ovarian or adnexal masses. No abnormal free fluid in
the pelvis. No uterine fibroids demonstrated.
IMPRESSION: 1. Single intrauterine gestation at 9 weeks 4 days by crown-rump
length, discordant with expected gestational age of 12 weeks 3 days
by provided menstrual dating. No fetal cardiac activity detected.
Findings are diagnostic of intrauterine fetal demise.
2. No ovarian or adnexal abnormality.

## 2022-09-08 ENCOUNTER — Encounter: Payer: Self-pay | Admitting: Internal Medicine

## 2022-09-08 ENCOUNTER — Other Ambulatory Visit: Payer: Self-pay | Admitting: Internal Medicine

## 2022-09-08 DIAGNOSIS — K219 Gastro-esophageal reflux disease without esophagitis: Secondary | ICD-10-CM

## 2022-09-08 NOTE — Telephone Encounter (Signed)
Requested Prescriptions  Refused Prescriptions Disp Refills   omeprazole (PRILOSEC) 20 MG capsule [Pharmacy Med Name: OMEPRAZOLE 20MG  CAPSULES] 90 capsule 0    Sig: TAKE 1 CAPSULE(20 MG) BY MOUTH DAILY     Gastroenterology: Proton Pump Inhibitors Passed - 09/08/2022  4:19 PM      Passed - Valid encounter within last 12 months    Recent Outpatient Visits           1 month ago Essential hypertension   [place], [place]   4 months ago Acute bilateral low back pain without sciatica   [place], [place]   5 months ago Pap smear for cervical cancer screening   [place], [place]   7 months ago Essential hypertension   [place], [place]   7 months ago Urinary frequency   [place], Dionne Bucy, [place]       Future Appointments             In 2 months Wynetta Emery, Dalbert Batman, MD [place]

## 2022-09-10 ENCOUNTER — Encounter: Payer: Self-pay | Admitting: Physical Therapy

## 2022-09-10 ENCOUNTER — Encounter: Payer: Medicaid Other | Attending: Obstetrics and Gynecology | Admitting: Physical Therapy

## 2022-09-10 DIAGNOSIS — M6281 Muscle weakness (generalized): Secondary | ICD-10-CM | POA: Diagnosis not present

## 2022-09-10 DIAGNOSIS — R3915 Urgency of urination: Secondary | ICD-10-CM | POA: Diagnosis not present

## 2022-09-10 NOTE — Patient Instructions (Signed)
Urge Incontinence  Ideal urination frequency is every 2-4 wakeful hours, which equates to 5-8 times within a 24-hour period.   Urge incontinence is leakage that occurs when the bladder muscle contracts, creating a sudden need to go before getting to the bathroom.   Going too often when your bladder isn't actually full can disrupt the body's automatic signals to store and hold urine longer, which will increase urgency/frequency.  In this case, the bladder "is running the show" and strategies can be learned to retrain this pattern.   One should be able to control the first urge to urinate, at around 150mL.  The bladder can hold up to a "grande latte," or 400mL. To help you gain control, practice the Urge Drill below when urgency strikes.  This drill will help retrain your bladder signals and allow you to store and hold urine longer.  The overall goal is to stretch out your time between voids to reach a more manageable voiding schedule.    Practice your "quick flicks" often throughout the day (each waking hour) even when you don't need feel the urge to go.  This will help strengthen your pelvic floor muscles, making them more effective in controlling leakage.  Urge Drill  When you feel an urge to go, follow these steps to regain control: Stop what you are doing and be still Take one deep breath, directing your air into your abdomen Think an affirming thought, such as "I've got this." Do 5 quick flicks of your pelvic floor Walk with control to the bathroom to void, or delay voiding Markes Shatswell, PT [facility] [address], [address] [place], [place] [address] W: [phone] [email or web address]    

## 2022-09-10 NOTE — Therapy (Addendum)
OUTPATIENT PHYSICAL THERAPY TREATMENT NOTE   Patient Name: Brenda Colon MRN: [id number] DOB:09/22/1980, 42 y.o., female Today's Date: 09/10/2022  PCP: Ladell Pier, MD  REFERRING PROVIDER: Jaquita Folds, MD   END OF SESSION:   PT End of Session - 09/10/22 1029     Visit Number 3    Date for PT Re-Evaluation 10/29/22    Authorization Type [employer]    Authorization Time Period 08/06/2022-10/04/2022    Authorization - Visit Number 2    Authorization - Number of Visits 5    PT Start Time [id number]    PT Stop Time 1115    PT Time Calculation (min) 45 min    Activity Tolerance Patient tolerated treatment well    Behavior During Therapy [facility] for tasks assessed/performed             Past Medical History:  Diagnosis Date   Hypertension    STD (sexually transmitted disease)    Gonorrhea,Trich   Uterine fibroid    Past Surgical History:  Procedure Laterality Date   CYSTECTOMY     Wrist   INDUCED ABORTION     Patient Active Problem List   Diagnosis Date Noted   Morbid obesity 02/10/2022   GERD without esophagitis 02/10/2022   Prediabetes 02/10/2022   Hyperlipemia, mixed 11/30/2020   Esophageal dysphagia 11/29/2020   Essential hypertension 11/29/2020   Intrauterine pregnancy 08/21/2020   Pregnancy with uncertain fetal viability 08/21/2020   Chronic hypertension affecting pregnancy 12/30/2017   Family history of microcephaly 09/14/2017   Fibroid 08/15/2017   Chronic hypertension during pregnancy, antepartum 03/26/2017   GERD with esophagitis 03/26/2017   REFERRING DIAG:  N39.3 (ICD-10-CM) - SUI (stress urinary incontinence, female)  N32.81 (ICD-10-CM) - Overactive bladder  R35.1 (ICD-10-CM) - Nocturia      THERAPY DIAG:  Muscle weakness (generalized)   Urinary urgency   Rationale for Evaluation and Treatment: Rehabilitation   ONSET DATE: 2017   SUBJECTIVE:                                                                                                                                                                                             SUBJECTIVE STATEMENT: I am not feeling the strong urges that I have to urinate at night. During the day I am able to go tot the bathroom every 2-3 hours.  PAIN:  Are you having pain? No   PRECAUTIONS: None   WEIGHT BEARING RESTRICTIONS: No   FALLS:  Has patient fallen in last 6 months? No   LIVING ENVIRONMENT: Lives with: lives with their family   OCCUPATION: Nurse  tech   PLOF: Independent   PATIENT GOALS: be able to sleep without having to wake up to urinate   PERTINENT HISTORY:  Hypertension ; Uterine fibroid  Sexual abuse: No   BOWEL MOVEMENT: No issues   URINATION: Pain with urination: No Fully empty bladder: No, urinate multiple times in a row and to push on her belly or vagina to empty bladder  Stream: Strong Urgency: Yes at night particularly Frequency: Day time voids 10.  Nocturia: every 30 min-1 hr at night to void.  Leakage: Coughing and Sneezing Pads: No   INTERCOURSE: Not active at this time   PREGNANCY: Vaginal deliveries 1, son is 4 Tearing No       OBJECTIVE:    DIAGNOSTIC FINDINGS:  From [place] in 05/2017: right fundal intramural fibroid 11 x 9. 6 x 9 cm displaces gestational sac. Second fibroid 25 x 17 mm Pelvic floor strength I/V  PVR of 13 ml was obtained by bladder scan.    PATIENT SURVEYS:  UIQ-7: 19     COGNITION: Overall cognitive status: Within functional limits for tasks assessed                          SENSATION: Light touch: Appears intact Proprioception: Appears intact     POSTURE: No Significant postural limitations and obese   PELVIC ALIGNMENT:Pelvis in correct alignment   LUMBARAROM/PROM:   A/PROM A/PROM  eval  Flexion Decreased by 25%  Extension Decreased by 50%  Right lateral flexion Decreased by 25%  Left lateral flexion Decreased by 25%  Right rotation Decreased by 50%  Left rotation Decreased by 50%    (Blank rows = not tested)   LOWER EXTREMITY ROM:   Passive ROM Right eval Left eval  Hip flexion 90 90  Hip external rotation 45 30   (Blank rows = not tested)   LOWER EXTREMITY MMT:   MMT Right eval Left eval  Hip extension 3+/5 3+/5  Hip abduction 3-/5 3-/5  Hip adduction 4/5 4/5    PALPATION:   General  Decreased movement of lower rib cage                 External Perineal Exam intact                             Internal Pelvic Floor tenderness on the left levator ani and left side of urethra; decreased movement of the pubovaginalis and urethra sphincter. Patient will bear down instead of contract the pelvic floor. She was able to contract the pelvic floor correctly several times after tactile cues.    Patient confirms identification and approves PT to assess internal pelvic floor and treatment Yes   PELVIC MMT:   MMT eval 08/25/22  Vaginal 1/5 with bearing down 3/5, 14 S, 3 x  (Blank rows = not tested)         TONE: average   PROLAPSE: none   TODAY'S TREATMENT:       09/10/22 Neuromuscular re-education: Pelvic floor contraction training: Sitting pelvic floor contraction holding for 10 seconds with tactile cues to the lower abdomen 15 x  Quick contraction 5 times in sitting Sitting lean backwards and contract the abdomen 10x Down training: Discussed with patient on when she wakes up to not go to the bathroom unless she has the urge.  Educated patient on the urge to void, how to deter the urge to  void  using quick flicks  Exercises: Stretches/mobility: Hamstring stretch with strap holding 30 sec bil.  Butterfly stretch holding 60 sec with yoga block under knees for patient to relax Single knee to chest holding 15 sec bil. Using a strap Trunk rotation stretch with strap pulling leg over holding 30 sec bil.   08/25/22 Manual: Internal pelvic floor techniques: No emotional/communication barriers or cognitive limitation. Patient is motivated to learn. Patient  understands and agrees with treatment goals and plan. PT explains patient will be examined in standing, sitting, and lying down to see how their muscles and joints work. When they are ready, they will be asked to remove their underwear so PT can examine their perineum. The patient is also given the option of providing their own chaperone as one is not provided in our facility. The patient also has the right and is explained the right to defer or refuse any part of the evaluation or treatment including the internal exam. With the patient's consent, PT will use one gloved finger to gently assess the muscles of the pelvic floor, seeing how well it contracts and relaxes and if there is muscle symmetry. After, the patient will get dressed and PT and patient will discuss exam findings and plan of care. PT and patient discuss plan of care, schedule, attendance policy and HEP activities.  Therapist finger going through the vaginal canal performing manual work to the left iliococcygeus, left side of urethra and bladder Exercises: Stretches/mobility: Single knee to chest holding 15 sec bil.  Hamstring stretch with strap holding 30 sec bil.  Butterfly stretch holding 60 sec with yoga block under knees for patient to relax Supine trunk rotation back and forth 10x Strengthening: Contract the pelvic floor holding 14 sec 3 times Contract the pelvic floor quickly 5 times     PATIENT EDUCATION:  09/10/22 Education details: Access Code: [email or web address], stop drinking Pepsi after lunch/before bed Person educated: Patient Education method: Explanation, Demonstration, Tactile cues, Verbal cues, and Handouts Education comprehension: verbalized understanding, returned demonstration, verbal cues required, tactile cues required, and needs further education   HOME EXERCISE PROGRAM: 09/10/22 Access Code: [email or web address] URL: [email or web address] Date: 09/10/2022 Prepared by: Earlie Counts  Exercises - Supine Single Knee  to Chest  - 1 x daily - 7 x weekly - 1 sets - 2 reps - 15 sec hold - Supine Hamstring Stretch with Strap  - 1 x daily - 7 x weekly - 1 sets - 2 reps - 30 sec hold - Supine Butterfly Groin Stretch  - 1 x daily - 7 x weekly - 1 sets - 1 reps - 30-60 sec hold - Supine Lower Trunk Rotation  - 1 x daily - 7 x weekly - 1 sets - 10 reps - Seated Pelvic Floor Contraction  - 2 x daily - 7 x weekly - 1 sets - 5 reps - 5 sec hold - Seated Quick Flick Pelvic Floor Contractions  - 2 x daily - 7 x weekly - 1 sets - 5 reps - Seated Eccentric Abdominal Lean Back  - 1 x daily - 7 x weekly - 1 sets - 10 reps   ASSESSMENT:   CLINICAL IMPRESSION: Patient is a 42 y.o. female who was seen today for physical therapy  treatment for overactive bladder, stress incontinence, and nocturia. Patient feels she is going to the bathroom less since last visit. Patient will not wake up due to the urge to urinate instead due to things on her mind. She is learning  how to not go tot he bathroom in the middle of the night when she wakes up for just in case. Patient still leaks with coughing and sneezing.  Patient has a busy schedule and is learning 3 exercises she can do to fit into her schedule. Patient would benefit from skilled therapy to reduce urinary frequency at night and reduce her leakage.    OBJECTIVE IMPAIRMENTS: decreased activity tolerance, decreased coordination, decreased endurance, decreased strength, increased fascial restrictions, and increased muscle spasms.    ACTIVITY LIMITATIONS: sleeping and continence   PARTICIPATION LIMITATIONS: community activity   PERSONAL FACTORS: Fitness and 1-2 comorbidities: Hypertension ; Uterine fibroid  are also affecting patient's functional outcome.    REHAB POTENTIAL: Good   CLINICAL DECISION MAKING: Stable/uncomplicated   EVALUATION COMPLEXITY: Low     GOALS: Goals reviewed with patient? Yes   SHORT TERM GOALS: Target date: 09/03/22   Patient independent with initial  HEP for pelvic floor contraction and relaxation.  Baseline: Not educated yet Goal status: Met 08/25/22     LONG TERM GOALS: Target date: 10/29/22   Patient independent with advanced HEP for pelvic floor strength and coordination to reduce urgency and leakage.  Baseline:  Goal status: ongoing 09/10/22   2.  Patient understands how to use the urge to void technique so she is able to reduce her night time urination to 1-2 times per night.  Baseline: go 10x per night Goal status: ongoing 09/10/22   3.  Pelvic floor strength increased >/= 3/5 and able to lift and hug therapist finger to reduce her urinary leakage >/= 70%.  Baseline: Pelvic floor strength 1/5 and will bear down.  Goal status: Ongoing 09/10/22   4.  UIQ-7 score is </= 5 due to decreased frustration of her issues due to able to sleep better at night.  Baseline: UIQ-7: 19 Goal status: INITIAL     PLAN:   PT FREQUENCY: 1x/week   PT DURATION: 12 weeks   PLANNED INTERVENTIONS: Therapeutic exercises, Therapeutic activity, Neuromuscular re-education, Patient/Family education, Joint mobilization, Dry Needling, Cryotherapy, Moist heat, Biofeedback, and Manual therapy   PLAN FOR NEXT SESSION: see how the urge to void, internal work to assess strength, update HEP, renewal   Eulis Foster, PT 09/10/22 11:28 AM    PHYSICAL THERAPY DISCHARGE SUMMARY  Visits from Start of Care: 3  Current functional level related to goals / functional outcomes: See above. Called patient today and she wants to be discharged at this time due to family issues and some deaths in the family.    Remaining deficits: See above.    Education / Equipment: HEP   Patient agrees to discharge. Patient goals were not met. Patient is being discharged due to the patient's request. Thank you for the referral. Eulis Foster, PT 10/15/22 9:57 AM

## 2022-09-30 ENCOUNTER — Ambulatory Visit: Payer: Medicaid Other | Admitting: Registered"

## 2022-10-01 ENCOUNTER — Encounter: Payer: Medicaid Other | Admitting: Physical Therapy

## 2022-10-08 ENCOUNTER — Other Ambulatory Visit: Payer: Self-pay | Admitting: Internal Medicine

## 2022-10-08 DIAGNOSIS — K219 Gastro-esophageal reflux disease without esophagitis: Secondary | ICD-10-CM

## 2022-10-13 ENCOUNTER — Encounter (HOSPITAL_BASED_OUTPATIENT_CLINIC_OR_DEPARTMENT_OTHER): Payer: Self-pay | Admitting: Obstetrics and Gynecology

## 2022-10-13 NOTE — Progress Notes (Signed)
Spoke w/ via phone for pre-op interview--- [facility] needs dos---- ISTAT and UPT per anesthesia. Surgeon orders pending.              Lab results------EKG in Epic dated 03/04/22 COVID test -----patient states asymptomatic no test needed Arrive at -------1215 NPO after MN NO Solid Food.  Clear liquids from MN until---1115 Med rec completed Medications to take morning of surgery -----Norvasc and Omeprazole Diabetic medication ----- Patient instructed no nail polish to be worn day of surgery Patient instructed to bring photo id and insurance card day of surgery Patient aware to have Driver (ride ) / caregiver  Unsure pt aware she has to have driver and caregiver for 2  for 24 hours after surgery  Patient Special Instructions ----- Pre-Op special Instructions ----- Patient verbalized understanding of instructions that were given at this phone interview. Patient denies shortness of breath, chest pain, fever, cough at this phone interview.

## 2022-10-14 DIAGNOSIS — N939 Abnormal uterine and vaginal bleeding, unspecified: Secondary | ICD-10-CM | POA: Insufficient documentation

## 2022-10-15 ENCOUNTER — Telehealth: Payer: Self-pay | Admitting: Physical Therapy

## 2022-10-15 ENCOUNTER — Encounter: Payer: Medicaid Other | Attending: Obstetrics and Gynecology | Admitting: Physical Therapy

## 2022-10-15 DIAGNOSIS — R3915 Urgency of urination: Secondary | ICD-10-CM | POA: Insufficient documentation

## 2022-10-15 DIAGNOSIS — M6281 Muscle weakness (generalized): Secondary | ICD-10-CM | POA: Insufficient documentation

## 2022-10-15 NOTE — Telephone Encounter (Signed)
Called patient about missed appointment today at 9:30. She reported she forgot. She has had lots of family issue going on including deaths in the family and would like to be discharged at this time.  Brenda Colon, PT @5 /9/24@ 9:54 AM

## 2022-10-28 ENCOUNTER — Ambulatory Visit (HOSPITAL_BASED_OUTPATIENT_CLINIC_OR_DEPARTMENT_OTHER): Payer: Medicaid Other | Admitting: Anesthesiology

## 2022-10-28 ENCOUNTER — Encounter (HOSPITAL_BASED_OUTPATIENT_CLINIC_OR_DEPARTMENT_OTHER): Payer: Self-pay | Admitting: Obstetrics and Gynecology

## 2022-10-28 ENCOUNTER — Encounter (HOSPITAL_BASED_OUTPATIENT_CLINIC_OR_DEPARTMENT_OTHER)
Admission: RE | Disposition: A | Discharge status: Home / Self Care | Payer: Self-pay | Source: Home / Self Care | Attending: Obstetrics and Gynecology

## 2022-10-28 ENCOUNTER — Ambulatory Visit (HOSPITAL_BASED_OUTPATIENT_CLINIC_OR_DEPARTMENT_OTHER)
Admission: RE | Admit: 2022-10-28 | Discharge: 2022-10-28 | Disposition: A | Discharge status: Home / Self Care | Payer: Medicaid Other | Attending: Obstetrics and Gynecology | Admitting: Obstetrics and Gynecology

## 2022-10-28 DIAGNOSIS — I10 Essential (primary) hypertension: Secondary | ICD-10-CM | POA: Insufficient documentation

## 2022-10-28 DIAGNOSIS — N840 Polyp of corpus uteri: Secondary | ICD-10-CM | POA: Insufficient documentation

## 2022-10-28 DIAGNOSIS — K219 Gastro-esophageal reflux disease without esophagitis: Secondary | ICD-10-CM | POA: Insufficient documentation

## 2022-10-28 DIAGNOSIS — Z01818 Encounter for other preprocedural examination: Secondary | ICD-10-CM

## 2022-10-28 DIAGNOSIS — N858 Other specified noninflammatory disorders of uterus: Secondary | ICD-10-CM | POA: Diagnosis not present

## 2022-10-28 DIAGNOSIS — N979 Female infertility, unspecified: Secondary | ICD-10-CM

## 2022-10-28 DIAGNOSIS — D259 Leiomyoma of uterus, unspecified: Secondary | ICD-10-CM | POA: Diagnosis not present

## 2022-10-28 DIAGNOSIS — O10919 Unspecified pre-existing hypertension complicating pregnancy, unspecified trimester: Secondary | ICD-10-CM

## 2022-10-28 DIAGNOSIS — N849 Polyp of female genital tract, unspecified: Secondary | ICD-10-CM | POA: Diagnosis not present

## 2022-10-28 DIAGNOSIS — N939 Abnormal uterine and vaginal bleeding, unspecified: Secondary | ICD-10-CM

## 2022-10-28 HISTORY — PX: HYSTEROSCOPY WITH D & C: SHX1775

## 2022-10-28 HISTORY — DX: Gastro-esophageal reflux disease without esophagitis: K21.9

## 2022-10-28 LAB — POCT I-STAT, CHEM 8
BUN: 9 mg/dL (ref 6–20)
Calcium, Ion: 1.19 mmol/L (ref 1.15–1.40)
Chloride: 102 mmol/L (ref 98–111)
Creatinine, Ser: 0.8 mg/dL (ref 0.44–1.00)
Glucose, Bld: 92 mg/dL (ref 70–99)
HCT: 35 % — ABNORMAL LOW (ref 36.0–46.0)
Hemoglobin: 11.9 g/dL — ABNORMAL LOW (ref 12.0–15.0)
Potassium: 3.6 mmol/L (ref 3.5–5.1)
Sodium: 140 mmol/L (ref 135–145)
TCO2: 29 mmol/L (ref 22–32)

## 2022-10-28 LAB — POCT PREGNANCY, URINE: Preg Test, Ur: NEGATIVE

## 2022-10-28 SURGERY — DILATATION AND CURETTAGE /HYSTEROSCOPY
Anesthesia: General

## 2022-10-28 MED ORDER — MIDAZOLAM HCL 2 MG/2ML IJ SOLN
INTRAMUSCULAR | Status: AC
  Filled 2022-10-28: qty 2

## 2022-10-28 MED ORDER — ONDANSETRON HCL 4 MG/2ML IJ SOLN
INTRAMUSCULAR | Status: DC | PRN
  Administered 2022-10-28: 4 mg via INTRAVENOUS

## 2022-10-28 MED ORDER — FENTANYL CITRATE (PF) 100 MCG/2ML IJ SOLN
INTRAMUSCULAR | Status: AC
  Filled 2022-10-28: qty 2

## 2022-10-28 MED ORDER — LACTATED RINGERS IV SOLN: INTRAVENOUS | Status: DC

## 2022-10-28 MED ORDER — FENTANYL CITRATE (PF) 100 MCG/2ML IJ SOLN
INTRAMUSCULAR | Status: DC | PRN
  Administered 2022-10-28: 50 ug via INTRAVENOUS

## 2022-10-28 MED ORDER — ACETAMINOPHEN 500 MG PO TABS
1000.0000 mg | ORAL_TABLET | Freq: Once | ORAL | Status: AC
  Administered 2022-10-28: 1000 mg via ORAL

## 2022-10-28 MED ORDER — ACETAMINOPHEN 325 MG PO TABS: 650.0000 mg | ORAL_TABLET | Freq: Four times a day (QID) | ORAL | 1 refills | Status: AC | PRN

## 2022-10-28 MED ORDER — MIDAZOLAM HCL 2 MG/2ML IJ SOLN
INTRAMUSCULAR | Status: DC | PRN
  Administered 2022-10-28: 2 mg via INTRAVENOUS

## 2022-10-28 MED ORDER — BUPIVACAINE HCL (PF) 0.5 % IJ SOLN
INTRAMUSCULAR | Status: DC | PRN
  Administered 2022-10-28: 10 mL

## 2022-10-28 MED ORDER — DEXAMETHASONE SODIUM PHOSPHATE 4 MG/ML IJ SOLN
INTRAMUSCULAR | Status: DC | PRN
  Administered 2022-10-28: 10 mg via INTRAVENOUS

## 2022-10-28 MED ORDER — PROPOFOL 10 MG/ML IV BOLUS
INTRAVENOUS | Status: AC
  Filled 2022-10-28: qty 20

## 2022-10-28 MED ORDER — PROPOFOL 10 MG/ML IV BOLUS
INTRAVENOUS | Status: DC | PRN
  Administered 2022-10-28: 200 mg via INTRAVENOUS

## 2022-10-28 MED ORDER — ACETAMINOPHEN 500 MG PO TABS
ORAL_TABLET | ORAL | Status: AC
  Filled 2022-10-28: qty 2

## 2022-10-28 MED ORDER — LIDOCAINE HCL (CARDIAC) PF 100 MG/5ML IV SOSY
PREFILLED_SYRINGE | INTRAVENOUS | Status: DC | PRN
  Administered 2022-10-28: 60 mg via INTRAVENOUS

## 2022-10-28 MED ORDER — PROPOFOL 500 MG/50ML IV EMUL
INTRAVENOUS | Status: DC | PRN
  Administered 2022-10-28: 200 ug/kg/min via INTRAVENOUS

## 2022-10-28 MED ORDER — ACETAMINOPHEN 500 MG PO TABS: 1000.0000 mg | ORAL_TABLET | ORAL | Status: DC

## 2022-10-28 MED ORDER — FENTANYL CITRATE (PF) 100 MCG/2ML IJ SOLN: 25.0000 ug | INTRAMUSCULAR | Status: DC | PRN

## 2022-10-28 MED ORDER — KETOROLAC TROMETHAMINE 30 MG/ML IJ SOLN
INTRAMUSCULAR | Status: DC | PRN
  Administered 2022-10-28: 30 mg via INTRAVENOUS

## 2022-10-28 MED ORDER — SODIUM CHLORIDE 0.9 % IR SOLN
Status: DC | PRN
  Administered 2022-10-28: 3000 mL

## 2022-10-28 SURGICAL SUPPLY — 19 items
CATH ROBINSON RED A/P 16FR (CATHETERS) ×1 IMPLANT
DEVICE MYOSURE LITE (MISCELLANEOUS) IMPLANT
DEVICE MYOSURE REACH (MISCELLANEOUS) IMPLANT
DRSG TELFA 3X8 NADH STRL (GAUZE/BANDAGES/DRESSINGS) ×1 IMPLANT
GAUZE 4X4 16PLY ~~LOC~~+RFID DBL (SPONGE) ×1 IMPLANT
GLOVE BIO SURGEON STRL SZ7 (GLOVE) ×1 IMPLANT
GLOVE BIOGEL PI IND STRL 7.0 (GLOVE) IMPLANT
GOWN STRL REUS W/TWL LRG LVL3 (GOWN DISPOSABLE) IMPLANT
GOWN STRL REUS W/TWL XL LVL3 (GOWN DISPOSABLE) ×1 IMPLANT
KIT PROCEDURE FLUENT (KITS) ×1 IMPLANT
KIT TURNOVER CYSTO (KITS) ×1 IMPLANT
LOOP CUTTING BIPOLAR 21FR (ELECTRODE) IMPLANT
MYOSURE XL FIBROID (MISCELLANEOUS)
PACK VAGINAL MINOR WOMEN LF (CUSTOM PROCEDURE TRAY) ×1 IMPLANT
PAD OB MATERNITY 4.3X12.25 (PERSONAL CARE ITEMS) ×1 IMPLANT
SEAL CERVICAL OMNI LOK (ABLATOR) IMPLANT
SEAL ROD LENS SCOPE MYOSURE (ABLATOR) ×1 IMPLANT
SLEEVE SCD COMPRESS KNEE MED (STOCKING) ×1 IMPLANT
SYSTEM TISS REMOVAL MYOSURE XL (MISCELLANEOUS) IMPLANT

## 2022-10-28 NOTE — Transfer of Care (Signed)
Immediate Anesthesia Transfer of Care Note  Patient: Kathi Der  Procedure(s) Performed: DILATATION AND CURETTAGE Melton Krebs WITH MYOSURE  Patient Location: PACU  Anesthesia Type:General  Level of Consciousness: awake, alert , and patient cooperative  Airway & Oxygen Therapy: Patient Spontanous Breathing and Patient connected to nasal cannula oxygen  Post-op Assessment: Report given to RN, Post -op Vital signs reviewed and stable, and Patient moving all extremities X 4  Post vital signs: Reviewed and stable  Last Vitals:  Vitals Value Taken Time  BP 116/88 10/28/22 1339  Temp 36.5 C 10/28/22 1339  Pulse 107 10/28/22 1341  Resp 17 10/28/22 1341  SpO2 100 % 10/28/22 1341  Vitals shown include unvalidated device data.  Last Pain:  Vitals:   10/28/22 1202  TempSrc: Oral  PainSc: 3       Patients Stated Pain Goal: 3 (10/28/22 1202)  Complications: No notable events documented.

## 2022-10-28 NOTE — Brief Op Note (Signed)
10/28/2022  1:35 PM  PATIENT:  Kathi Der  42 y.o. female  PRE-OPERATIVE DIAGNOSIS:  Polyps  POST-OPERATIVE DIAGNOSIS:  Polyps  PROCEDURE:  Procedure(s): DILATATION AND CURETTAGE /HYSTEROSCOPY WITH MYOSURE (N/A)  SURGEON:  Surgeon(s) and Role:    Lorriane Shire, MD - Primary  PHYSICIAN ASSISTANT: none  ASSISTANTS: none   ANESTHESIA:   general  EBL:  10 ml   BLOOD ADMINISTERED:none  DRAINS: none   LOCAL MEDICATIONS USED:  BUPIVICAINE   SPECIMEN:  Source of Specimen:  endometrial curetting  DISPOSITION OF SPECIMEN:  PATHOLOGY  COUNTS:  YES  TOURNIQUET:  * No tourniquets in log *  DICTATION: .Note written in EPIC  PLAN OF CARE: Discharge to home after PACU  PATIENT DISPOSITION:  PACU - hemodynamically stable.   Delay start of Pharmacological VTE agent (>24hrs) due to surgical blood loss or risk of bleeding: not applicable

## 2022-10-28 NOTE — Anesthesia Postprocedure Evaluation (Signed)
Anesthesia Post Note  Patient: Brenda Colon  Procedure(s) Performed: DILATATION AND CURETTAGE /HYSTEROSCOPY WITH MYOSURE     Patient location during evaluation: PACU Anesthesia Type: General Level of consciousness: awake and alert Pain management: pain level controlled Vital Signs Assessment: post-procedure vital signs reviewed and stable Respiratory status: spontaneous breathing, nonlabored ventilation and respiratory function stable Cardiovascular status: blood pressure returned to baseline and stable Postop Assessment: no apparent nausea or vomiting Anesthetic complications: no  No notable events documented.  Last Vitals:  Vitals:   10/28/22 1400 10/28/22 1415  BP: (!) 117/90 (!) 115/94  Pulse: 77 81  Resp: 12 14  Temp:  36.4 C  SpO2: 98% 100%    Last Pain:  Vitals:   10/28/22 1400  TempSrc:   PainSc: 0-No pain                 Donnavin Vandenbrink,W. EDMOND

## 2022-10-28 NOTE — Anesthesia Preprocedure Evaluation (Addendum)
Anesthesia Evaluation  Patient identified by MRN, date of birth, ID band Patient awake    Reviewed: Allergy & Precautions, H&P , NPO status , Patient's Chart, lab work & pertinent test results  Airway Mallampati: II  TM Distance: >3 FB Neck ROM: Full    Dental no notable dental hx. (+) Teeth Intact, Dental Advisory Given   Pulmonary neg pulmonary ROS   Pulmonary exam normal breath sounds clear to auscultation       Cardiovascular hypertension, Pt. on medications  Rhythm:Regular Rate:Normal     Neuro/Psych negative neurological ROS  negative psych ROS   GI/Hepatic Neg liver ROS,GERD  Medicated,,  Endo/Other  negative endocrine ROS    Renal/GU negative Renal ROS  negative genitourinary   Musculoskeletal   Abdominal   Peds  Hematology negative hematology ROS (+)   Anesthesia Other Findings   Reproductive/Obstetrics negative OB ROS                             Anesthesia Physical Anesthesia Plan  ASA: 2  Anesthesia Plan: General   Post-op Pain Management: Tylenol PO (pre-op)* and Toradol IV (intra-op)*   Induction: Intravenous  PONV Risk Score and Plan: 4 or greater and Ondansetron, Dexamethasone, Propofol infusion, TIVA and Midazolam  Airway Management Planned: LMA  Additional Equipment:   Intra-op Plan:   Post-operative Plan: Extubation in OR  Informed Consent: I have reviewed the patients History and Physical, chart, labs and discussed the procedure including the risks, benefits and alternatives for the proposed anesthesia with the patient or authorized representative who has indicated his/her understanding and acceptance.     Dental advisory given  Plan Discussed with: CRNA  Anesthesia Plan Comments:        Anesthesia Quick Evaluation

## 2022-10-28 NOTE — Op Note (Signed)
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  INDICATIONS: 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  Risks of surgery were discussed with the patient including but not limited to: bleeding which may require transfusion; infection which may require antibiotics; injury to surrounding organs; need for additional procedures including laparotomy;  and other postoperative/anesthesia complications. Written informed consent was obtained.    FINDINGS:  Normal external genitalia, 10 wk size mobile/immobile uterus with Normal contours, normal cervix Hysteroscopically: bilateral tubal ostia visualized, hyperemic endometrium, possible polyp vs tissue from entry on posterior lower uterine segment wall, small fibroid in fundus  ANESTHESIA: General, paracervical block INTRAVENOUS FLUIDS:  500 ml of LR ESTIMATED BLOOD LOSS:  10 URINE OUTPUT: n/a SPECIMENS: endometrial curetting COMPLICATIONS:  None immediate.   PROCEDURE: The patient was taken to the operating room where spinal analgesia was inserted. SCDs were in place.  Time out was performed. Patient was placed in dorsolithotomy in [place] stirrups. She was prepped and draped in the usual sterile fashion. A Red Rubber catheter was used to drain her bladder. A speculum was placeed in the vagina. The cervix was visualized anteriorly and grasped with a single-tooth tenaculum. Paracervical block was performed with 0.5% bupivicaine with 10 cc injected. Sequential dilation was performed with Shawnie Pons dilators. The hysteroscope was inserted and the endometrial cavity and inspected. The above findings noted in the endometrial cavity with both ostia seen. The mmyosure reach used to remove the tissue. Attempted resection of fundal myoma but  predominantly intramural nd decision made to abort myomectomy (figo 2-3).  The hysteroscope was removed. Sharp curettage was performed in all 4 quadrants. All instruments were removed from the vagina. All instrument, needle and lap counts were correct x2. The patient was awakened and is recovering in stable condition.    Lorriane Shire, MD Minimally Invasive Gynecologic Surgery and Chronic Pelvic Pain Specialist Obstetrics and Gynecology, [facility] for [facility], [facility] 10/28/2022

## 2022-10-28 NOTE — H&P (Signed)
OB/GYN Pre-Op History and Physical  Brenda Colon is a 42 y.o. 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       Past Medical History:  Diagnosis Date   GERD (gastroesophageal reflux disease)    Hypertension    STD (sexually transmitted disease)    Gonorrhea,Trich   Uterine fibroid     Past Surgical History:  Procedure Laterality Date   CYSTECTOMY     Wrist   INDUCED ABORTION      OB History  Gravida Para Term Preterm AB Living  4 1 1   2 1   SAB IAB Ectopic Multiple Live Births  1 1   0 1    # Outcome Date GA Lbr Len/2nd Weight Sex Delivery Anes PTL Lv  4 Gravida           3 Term 01/01/18 [redacted]w[redacted]d 04:07 / 01:39 3225 g M Vag-Vacuum EPI  LIV  2 SAB 2008          1 IAB             Social History   Socioeconomic History   Marital status: Single    Spouse name: Not on file   Number of children: Not on file   Years of education: Not on file   Highest education level: Not on file  Occupational History   Not on file  Tobacco Use   Smoking status: Never   Smokeless tobacco: Never  Vaping Use   Vaping Use: Never used  Substance and Sexual Activity   Alcohol use: No   Drug use: No   Sexual activity: Not Currently    Partners: Male    Birth control/protection: None    Comment: 1st intercourse 40 yo-5 partners  Other Topics Concern   Not on file  Social History Narrative   Not on file   Social Determinants of Health   Financial Resource Strain: Not on file  Food Insecurity: No Food Insecurity (08/18/2022)   Hunger Vital Sign    Worried About Running Out of Food in the Last Year: Never true    Ran Out of Food in the Last Year: Never true  Transportation Needs: Not on file  Physical Activity: Not on file  Stress: Not on file  Social Connections: Not on file    Family History  Problem Relation Age of Onset   Diabetes Mother    Diabetes Father    Lupus Sister     Medications Prior to Admission  Medication Sig Dispense Refill  Last Dose   amLODipine (NORVASC) 10 MG tablet Take 1 tablet (10 mg total) by mouth daily. 90 tablet 1 10/13/2022   meloxicam (MOBIC) 15 MG tablet Take 1 tablet (15 mg total) by mouth daily. 30 tablet 1 10/13/2022   omeprazole (PRILOSEC) 20 MG capsule TAKE 1 CAPSULE(20 MG) BY MOUTH DAILY 90 capsule 0 10/13/2022   solifenacin (VESICARE) 5 MG tablet Take 5 mg by mouth daily.   10/13/2022   spironolactone (ALDACTONE) 25 MG tablet Take 1 tablet (25 mg total) by mouth daily. 90 tablet 1 10/13/2022    No Known Allergies  Review of Systems: Negative except for what is mentioned in HPI.     Physical Exam: Ht 5\' 6"  (1.676 m)   Wt 122.5 kg   LMP 09/25/2022 (Exact Date)   BMI 43.58 kg/m  CONSTITUTIONAL: Well-developed, well-nourished female in no acute distress.  HENT:  Normocephalic, atraumatic, External right and left ear normal. Oropharynx is clear and moist  EYES: Conjunctivae and EOM are normal. Pupils are equal, round, and reactive to light. No scleral icterus.  NECK: Normal range of motion, supple, no masses SKIN: Skin is warm and dry. No rash noted. Not diaphoretic. No erythema. No pallor. NEUROLGIC: Alert and oriented to person, place, and time. Normal reflexes, muscle tone coordination. No cranial nerve deficit noted. PSYCHIATRIC: Normal mood and affect. Normal behavior. Normal judgment and thought content. RESPIRATORY: normal effort PELVIC: Deferred   Pertinent Labs/Studies:   No results found for this or any previous visit (from the past 72 hour(s)).     Assessment and Plan :Brenda Colon is a 42 y.o. 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    Lorriane Shire, M.D. Minimally Invasive Gynecologic Surgery and Pelvic Pain Specialist Attending Obstetrician & Gynecologist, [facility] for [employer], [facility]

## 2022-10-28 NOTE — Discharge Instructions (Addendum)
Post-surgical Instructions, Outpatient Surgery  You may expect to feel dizzy, weak, and drowsy for as long as 24 hours after receiving the medicine that made you sleep (anesthetic). For the first 24 hours after your surgery:   Do not drive a car, ride a bicycle, participate in physical activities, or take public transportation until you are done taking narcotic pain medicines or as directed by Dr. Briscoe Deutscher.  Do not drink alcohol or take tranquilizers.  Do not take medicine that has not been prescribed by your physicians.  Do not sign important papers or make important decisions while on narcotic pain medicines.  Have a responsible person with you.    PAIN MANAGEMENT Meloxicam daily as you usually do    Acetaminophen 650mg  (This is the same as 2-325mg  over the counter extra strength tylenol). Take this every 6 hours for the first 3 days or as needed afterwards for pain   DO'S AND DON'T'S Do not take a tub bath for 2 weeks.  You may shower on the first day after your surgery Do not do any heavy lifting for one to two weeks.  This increases the chance of bleeding. Do move around as you feel able.  Stairs are fine.  You may begin to exercise again as you feel able.  Do not lift any weights for two weeks. Do not put anything in the vagina for two weeks--no tampons, intercourse, or douching.    REGULAR MEDIATIONS/VITAMINS: You may restart all of your regular medications as prescribed. You may restart all of your vitamins as you normally take them.    PLEASE CALL OR SEEK MEDICAL CARE IF: You have persistent nausea and vomiting.  You have trouble eating or drinking.  You have an oral temperature above 100.5.  You have constipation that is not helped by adjusting diet or increasing fluid intake. Pain medicines are a common cause of constipation.  You have heavy vaginal bleeding You have redness or drainage from your incision(s) or there is increasing pain or tenderness near or in the surgical  site.     Post Anesthesia Home Care Instructions  Activity: Get plenty of rest for the remainder of the day. A responsible individual must stay with you for 24 hours following the procedure.  For the next 24 hours, DO NOT: -Drive a car -Advertising copywriter -Drink alcoholic beverages -Take any medication unless instructed by your physician -Make any legal decisions or sign important papers.  Meals: Start with liquid foods such as gelatin or soup. Progress to regular foods as tolerated. Avoid greasy, spicy, heavy foods. If nausea and/or vomiting occur, drink only clear liquids until the nausea and/or vomiting subsides. Call your physician if vomiting continues.  Special Instructions/Symptoms: Your throat may feel dry or sore from the anesthesia or the breathing tube placed in your throat during surgery. If this causes discomfort, gargle with warm salt water. The discomfort should disappear within 24 hours.  If you had a scopolamine patch placed behind your ear for the management of post- operative nausea and/or vomiting:  1. The medication in the patch is effective for 72 hours, after which it should be removed.  Wrap patch in a tissue and discard in the trash. Wash hands thoroughly with soap and water. 2. You may remove the patch earlier than 72 hours if you experience unpleasant side effects which may include dry mouth, dizziness or visual disturbances. 3. Avoid touching the patch. Wash your hands with soap and water after contact with the patch.

## 2022-10-28 NOTE — Anesthesia Procedure Notes (Signed)
Procedure Name: LMA Insertion Date/Time: 10/28/2022 1:06 PM  Performed by: Earmon Phoenix, CRNAPre-anesthesia Checklist: Patient identified, Patient being monitored, Emergency Drugs available, Timeout performed and Suction available Patient Re-evaluated:Patient Re-evaluated prior to induction Oxygen Delivery Method: Circle System Utilized Preoxygenation: Pre-oxygenation with 100% oxygen Induction Type: IV induction Ventilation: Mask ventilation without difficulty LMA: LMA inserted LMA Size: 4.0 Number of attempts: 1 Placement Confirmation: positive ETCO2 and breath sounds checked- equal and bilateral

## 2022-10-29 LAB — SURGICAL PATHOLOGY

## 2022-10-30 ENCOUNTER — Encounter (HOSPITAL_BASED_OUTPATIENT_CLINIC_OR_DEPARTMENT_OTHER): Payer: Self-pay | Admitting: Obstetrics and Gynecology

## 2022-11-17 ENCOUNTER — Telehealth: Payer: Self-pay

## 2022-11-17 ENCOUNTER — Other Ambulatory Visit: Payer: Self-pay

## 2022-11-17 NOTE — Telephone Encounter (Signed)
Patient attempted to be outreached by Thyra Breed, PharmD Candidate on 11/17/22 to discuss hypertension. Left voicemail for patient to return our call at their convenience at [phone]. Patient returned call immediately, see encounter.   Thyra Breed, Student-PharmD

## 2022-11-17 NOTE — Progress Notes (Signed)
   Brenda Colon [birth year] [id number]  Patient outreached by Thyra Breed , PharmD Candidate on 11/17/22.  Blood Pressure Readings: Last documented ambulatory systolic blood pressure: 123 Last documented ambulatory diastolic blood pressure: 90 Does the patient have a validated home blood pressure machine?: No Patient reports that PCP has mentioned measuring BP at home but she is not interested at this time. Counseled that BP cuff is often covered by insurance and may be low or no cost to the patient, and she can reach out to [place] if she would like help getting one.   Medication review was performed. Is the patient taking their medications as prescribed?: Yes HTN meds reviewed. Pt states she is very adherent. No concerns with amlodipine at this time.   The following barriers to adherence were noted: Does the patient have cost concerns?: Yes Does the patient have transportation concerns?: Yes Does the patient need assistance obtaining refills?: No Does the patient occassionally forget to take some of their prescribed medications?: No Does the patient feel like one/some of their medications make them feel poorly?: No Does the patient have questions or concerns about their medications?: No Does the patient have a follow up scheduled with their primary care provider/cardiologist?: No   Interventions: Interventions Completed: Medications were reviewed, Patient was educated on proper technique to check home blood pressure and reminded to bring home machine and readings to next provider appointment  The patient has follow up scheduled:  PCP: Marcine Matar, MD Patient requested that I cancel this appointment due to work conflict. She will reschedule for a later date.    Thyra Breed, Student-PharmD

## 2022-11-18 ENCOUNTER — Ambulatory Visit: Payer: Medicaid Other | Admitting: Internal Medicine

## 2022-11-19 ENCOUNTER — Ambulatory Visit: Payer: Medicaid Other | Admitting: Internal Medicine

## 2022-11-19 ENCOUNTER — Telehealth (INDEPENDENT_AMBULATORY_CARE_PROVIDER_SITE_OTHER): Payer: Medicaid Other | Admitting: Obstetrics and Gynecology

## 2022-11-19 ENCOUNTER — Encounter: Payer: Self-pay | Admitting: Obstetrics and Gynecology

## 2022-11-19 DIAGNOSIS — N840 Polyp of corpus uteri: Secondary | ICD-10-CM | POA: Diagnosis not present

## 2022-11-19 DIAGNOSIS — Z3169 Encounter for other general counseling and advice on procreation: Secondary | ICD-10-CM

## 2022-11-19 NOTE — Progress Notes (Signed)
    GYNECOLOGY TEELEPHONE VISIT ENCOUNTER NOTE  Provider location: Center for [facility]   Patient location: Home  I connected with Brenda Colon on 11/19/22 at  2:55 PM EDT by telephone - attempted to connect via MyChart Video Encounter but unable to connect. ID verified that I am speaking with the correct person using two identifiers.   I discussed the limitations, risks, security and privacy concerns of performing an evaluation and management service virtually and the availability of in person appointments. I also discussed with the patient that there may be a patient responsible charge related to this service. The patient expressed understanding and agreed to proceed.   History:  Brenda Colon is a 42 y.o. [phone] female being evaluated today for postop followup. Has been doing well- no fever, chills or abnormal vaginal bleeding. No issues with voiding or BM. Has not heard from infertility clinic and wondering about status.      Past Medical History:  Diagnosis Date   GERD (gastroesophageal reflux disease)    Hypertension    STD (sexually transmitted disease)    Gonorrhea,Trich   Uterine fibroid    Past Surgical History:  Procedure Laterality Date   CYSTECTOMY     Wrist   HYSTEROSCOPY WITH D & C N/A 10/28/2022   Procedure: DILATATION AND CURETTAGE /HYSTEROSCOPY WITH MYOSURE;  Surgeon: Lorriane Shire, MD;  Location: [facility];  Service: Gynecology;  Laterality: N/A;   INDUCED ABORTION     The following portions of the patient's history were reviewed and updated as appropriate: allergies, current medications, past family history, past medical history, past social history, past surgical history and problem list.   Health Maintenance:     Component Value Date/Time   DIAGPAP  03/20/2022 1141    - Negative for intraepithelial lesion or malignancy (NILM)   HPVHIGH Negative 03/20/2022 1141   ADEQPAP  03/20/2022 1141     Satisfactory for evaluation; transformation zone component PRESENT.    High Risk HPV: Positive  Adequacy:  Satisfactory for evaluation, transformation zone component PRESENT  Diagnosis:  Atypical squamous cells of undetermined significance (ASC-US)   Review of Systems:  Pertinent items noted in HPI and remainder of comprehensive ROS otherwise negative.  Physical Exam:   General:  Alert, oriented and cooperative. Patient appears to be in no acute distress.  Mental Status: Normal mood and affect. Normal behavior. Normal judgment and thought content.   Rest of physical exam deferred due to type of encounter  FINAL MICROSCOPIC DIAGNOSIS:   A. ENDOMETRIUM, CURETTAGE:  - Benign endometrial polyp(s)  - Nonpolypoid benign inactive endometrium  - Negative for hyperplasia or malignancy    Assessment and Plan:     1. Endometrial polyp Resolved - s/p uncomplicated hysteroscopic polypectomy and doing well  2. Infertility counseling Infertility referral placed to [facility] - noted potential cost burden with infertility services.  - Ambulatory referral to Infertility     I discussed the assessment and treatment plan with the patient. The patient was provided an opportunity to ask questions and all were answered. The patient agreed with the plan and demonstrated an understanding of the instructions.   The patient was advised to call back or seek an in-person evaluation/go to the ED if the symptoms worsen or if the condition fails to improve as anticipated.  I provided 5 minutes of face-to-face time during this encounter.   Lorriane Shire, MD Center for [employer], [facility]

## 2023-01-12 ENCOUNTER — Ambulatory Visit: Payer: Medicaid Other | Attending: Nurse Practitioner | Admitting: Nurse Practitioner

## 2023-01-12 ENCOUNTER — Ambulatory Visit: Payer: Self-pay

## 2023-01-12 ENCOUNTER — Encounter: Payer: Self-pay | Admitting: Nurse Practitioner

## 2023-01-12 VITALS — BP 132/92 | HR 84 | Ht 66.0 in | Wt 268.4 lb

## 2023-01-12 DIAGNOSIS — M542 Cervicalgia: Secondary | ICD-10-CM

## 2023-01-12 DIAGNOSIS — K219 Gastro-esophageal reflux disease without esophagitis: Secondary | ICD-10-CM | POA: Diagnosis not present

## 2023-01-12 DIAGNOSIS — G8929 Other chronic pain: Secondary | ICD-10-CM | POA: Diagnosis not present

## 2023-01-12 DIAGNOSIS — M47896 Other spondylosis, lumbar region: Secondary | ICD-10-CM | POA: Diagnosis not present

## 2023-01-12 DIAGNOSIS — T63441A Toxic effect of venom of bees, accidental (unintentional), initial encounter: Secondary | ICD-10-CM

## 2023-01-12 DIAGNOSIS — I10 Essential (primary) hypertension: Secondary | ICD-10-CM | POA: Diagnosis not present

## 2023-01-12 MED ORDER — OMEPRAZOLE 20 MG PO CPDR
20.0000 mg | DELAYED_RELEASE_CAPSULE | Freq: Every day | ORAL | 1 refills | Status: DC
Start: 2023-01-12 — End: 2023-07-22

## 2023-01-12 MED ORDER — PREDNISONE 20 MG PO TABS
40.0000 mg | ORAL_TABLET | Freq: Every day | ORAL | 0 refills | Status: AC
Start: 2023-01-12 — End: 2023-01-17

## 2023-01-12 MED ORDER — SPIRONOLACTONE 25 MG PO TABS
25.0000 mg | ORAL_TABLET | Freq: Every day | ORAL | 1 refills | Status: DC
Start: 2023-01-12 — End: 2023-06-29

## 2023-01-12 MED ORDER — AMLODIPINE BESYLATE 10 MG PO TABS
10.0000 mg | ORAL_TABLET | Freq: Every day | ORAL | 1 refills | Status: DC
Start: 2023-01-12 — End: 2023-10-01

## 2023-01-12 MED ORDER — MELOXICAM 15 MG PO TABS
15.0000 mg | ORAL_TABLET | Freq: Every day | ORAL | 1 refills | Status: DC
Start: 2023-01-12 — End: 2023-06-29

## 2023-01-12 MED ORDER — METHOCARBAMOL 500 MG PO TABS
500.0000 mg | ORAL_TABLET | ORAL | 1 refills | Status: DC | PRN
Start: 2023-01-12 — End: 2024-02-02

## 2023-01-12 MED ORDER — HYDROXYZINE HCL 10 MG PO TABS
10.0000 mg | ORAL_TABLET | Freq: Three times a day (TID) | ORAL | 0 refills | Status: AC | PRN
Start: 2023-01-12 — End: ?

## 2023-01-12 NOTE — Telephone Encounter (Signed)
    Chief Complaint: Numerous yellow jacket stings Sunday to head, back and back of legs. Pain and itching. Symptoms: SOB with exertion. Frequency: Sunday Pertinent Negatives: Patient denies any other symptoms. Disposition: [] ED /[] Urgent Care (no appt availability in office) / [x] Appointment(In office/virtual)/ []  [place] [facility]/ [] Home Care/ [] Refused Recommended Disposition /[] [place] [facility]/ []  Follow-up with PCP Additional Notes: Agrees with appointment today. Call 911 for worsening of symptoms, SOB, chest pain.  Reason for Disposition  [1] Red or very tender (to touch) area AND [2] getting larger over 48 hours after the sting  Answer Assessment - Initial Assessment Questions 1. TYPE: "What type of sting was it?" (bee, yellow jacket, etc.)      Yellow jackets 2. ONSET: "When did it occur?"      Sunday 3. LOCATION: "Where is the sting located?"  "How many stings?"     Many 4. SWELLING SIZE: "How big is the swelling?" (e.g., inches or cm)     Yes 5. REDNESS: "Is the area red or pink?" If Yes, ask: "What size is area of redness?" (e.g., inches or cm). "When did the redness start?"     Unsure 6. PAIN: "Is there any pain?" If Yes, ask: "How bad is it?"  (Scale 1-10; or mild, moderate, severe)     Moderate 7. ITCHING: "Is there any itching?" If Yes, ask: "How bad is it?"      Moderate 8. RESPIRATORY DISTRESS: "Describe your breathing."     SOB with exertion 9. PRIOR REACTIONS: "Have you had any severe allergic reactions to stings in the past?" if yes, ask: "What happened?"     No 10. OTHER SYMPTOMS: "Do you have any other symptoms?" (e.g., abdomen pain, face or tongue swelling, new rash elsewhere, vomiting)       SOB with exertion 11. PREGNANCY: "Is there any chance you are pregnant?" "When was your last menstrual period?"       No  Protocols used: Bee or Yellow Jacket Sting-A-AH

## 2023-01-12 NOTE — Progress Notes (Signed)
Assessment & Plan:  Brenda Colon was seen today for insect bite.  Diagnoses and all orders for this visit:  Accidental bee sting -     predniSONE (DELTASONE) 20 MG tablet; Take 2 tablets (40 mg total) by mouth daily with breakfast for 5 days. -     hydrOXYzine (ATARAX) 10 MG tablet; Take 1 tablet (10 mg total) by mouth 3 (three) times daily as needed for itching.  Essential hypertension -     spironolactone (ALDACTONE) 25 MG tablet; Take 1 tablet (25 mg total) by mouth daily. -     amLODipine (NORVASC) 10 MG tablet; Take 1 tablet (10 mg total) by mouth daily. Continue all antihypertensives as prescribed.  Reminded to bring in blood pressure log for follow  up appointment.  RECOMMENDATIONS: DASH/Mediterranean Diets are healthier choices for HTN.    GERD without esophagitis -     omeprazole (PRILOSEC) 20 MG capsule; Take 1 capsule (20 mg total) by mouth daily. INSTRUCTIONS: Avoid GERD Triggers: acidic, spicy or fried foods, caffeine, coffee, sodas,  alcohol and chocolate.    Other osteoarthritis of spine, lumbar region -     methocarbamol (ROBAXIN) 500 MG tablet; Take 1 tablet (500 mg total) by mouth as needed for muscle spasms. -     meloxicam (MOBIC) 15 MG tablet; Take 1 tablet (15 mg total) by mouth daily.  Chronic neck pain -     methocarbamol (ROBAXIN) 500 MG tablet; Take 1 tablet (500 mg total) by mouth as needed for muscle spasms. -     meloxicam (MOBIC) 15 MG tablet; Take 1 tablet (15 mg total) by mouth daily.    Patient has been counseled on age-appropriate routine health concerns for screening and prevention. These are reviewed and up-to-date. Referrals have been placed accordingly. Immunizations are up-to-date or declined.    Subjective:   Chief Complaint  Patient presents with   Insect Bite   HPI Brenda Colon 42 y.o. female (patient of Dr. Henriette Combs) presents to office today after being stung by yellow jackets on several areas of her body 2 days ago. She is currently is  experiencing the following symptoms: intermittent chest tightness, pruritus on back of head, neck, both arms, back and both legs.  Blood pressure is elevated. She states she needs refills of her blood pressure medication. She is currently prescribed spironolactone and amlodipine. BP Readings from Last 3 Encounters:  01/12/23 (!) 132/92  10/28/22 (!) 139/100  08/25/22 (!) 123/90     Review of Systems  Constitutional:  Negative for fever, malaise/fatigue and weight loss.  HENT: Negative.  Negative for nosebleeds.   Eyes: Negative.  Negative for blurred vision, double vision and photophobia.  Respiratory:  Positive for shortness of breath. Negative for cough, hemoptysis, sputum production and wheezing.   Cardiovascular: Negative.  Negative for chest pain, palpitations and leg swelling.  Gastrointestinal: Negative.  Negative for heartburn, nausea and vomiting.  Musculoskeletal: Negative.  Negative for myalgias.  Skin:  Positive for itching.  Neurological: Negative.  Negative for dizziness, focal weakness, seizures and headaches.  Psychiatric/Behavioral: Negative.  Negative for suicidal ideas.     Past Medical History:  Diagnosis Date   GERD (gastroesophageal reflux disease)    Hypertension    STD (sexually transmitted disease)    Gonorrhea,Trich   Uterine fibroid     Past Surgical History:  Procedure Laterality Date   CYSTECTOMY     Wrist   HYSTEROSCOPY WITH D & C N/A 10/28/2022   Procedure: DILATATION AND  CURETTAGE /HYSTEROSCOPY WITH MYOSURE;  Surgeon: Lorriane Shire, MD;  Location: [facility];  Service: Gynecology;  Laterality: N/A;   INDUCED ABORTION      Family History  Problem Relation Age of Onset   Diabetes Mother    Diabetes Father    Lupus Sister     Social History Reviewed with no changes to be made today.   Outpatient Medications Prior to Visit  Medication Sig Dispense Refill   acetaminophen (TYLENOL) 325 MG tablet Take 2 tablets (650 mg  total) by mouth every 6 (six) hours as needed. 60 tablet 1   solifenacin (VESICARE) 5 MG tablet Take 5 mg by mouth daily.     amLODipine (NORVASC) 10 MG tablet Take 1 tablet (10 mg total) by mouth daily. 90 tablet 1   meloxicam (MOBIC) 15 MG tablet Take 1 tablet (15 mg total) by mouth daily. 30 tablet 1   methocarbamol (ROBAXIN) 500 MG tablet Take 500 mg by mouth as needed for muscle spasms.     omeprazole (PRILOSEC) 20 MG capsule TAKE 1 CAPSULE(20 MG) BY MOUTH DAILY 90 capsule 0   spironolactone (ALDACTONE) 25 MG tablet Take 1 tablet (25 mg total) by mouth daily. 90 tablet 1   No facility-administered medications prior to visit.    No Known Allergies     Objective:    BP (!) 132/92 (BP Location: Left Arm, Patient Position: Sitting, Cuff Size: Normal)   Pulse 84   Ht 5\' 6"  (1.676 m)   Wt 268 lb 6.4 oz (121.7 kg)   SpO2 99%   BMI 43.32 kg/m  Wt Readings from Last 3 Encounters:  01/12/23 268 lb 6.4 oz (121.7 kg)  10/28/22 275 lb 4.8 oz (124.9 kg)  08/25/22 272 lb 12.8 oz (123.7 kg)    Physical Exam Vitals and nursing note reviewed.  Constitutional:      Appearance: She is well-developed.  HENT:     Head: Normocephalic and atraumatic.  Cardiovascular:     Rate and Rhythm: Normal rate and regular rhythm.     Heart sounds: Normal heart sounds. No murmur heard.    No friction rub. No gallop.  Pulmonary:     Effort: Pulmonary effort is normal. No tachypnea or respiratory distress.     Breath sounds: Normal breath sounds. No decreased breath sounds, wheezing, rhonchi or rales.  Chest:     Chest wall: No tenderness.  Abdominal:     General: Bowel sounds are normal.     Palpations: Abdomen is soft.  Musculoskeletal:        General: Normal range of motion.     Cervical back: Normal range of motion.  Skin:    General: Skin is warm and dry.  Neurological:     Mental Status: She is alert and oriented to person, place, and time.     Coordination: Coordination normal.   Psychiatric:        Behavior: Behavior normal. Behavior is cooperative.        Thought Content: Thought content normal.        Judgment: Judgment normal.          Patient has been counseled extensively about nutrition and exercise as well as the importance of adherence with medications and regular follow-up. The patient was given clear instructions to go to ER or return to medical center if symptoms don't improve, worsen or new problems develop. The patient verbalized understanding.   Follow-up: Return in about 6 weeks (around 02/23/2023) for  HTN with PCP. Blood pressure is elevated today.   Claiborne Rigg, FNP-BC [facility] [place], [place] [phone]   01/12/2023, 1:58 PM

## 2023-02-12 ENCOUNTER — Encounter: Payer: Self-pay | Admitting: Pharmacist

## 2023-02-22 ENCOUNTER — Encounter: Payer: Self-pay | Admitting: Internal Medicine

## 2023-02-22 ENCOUNTER — Ambulatory Visit: Payer: Medicaid Other | Attending: Internal Medicine | Admitting: Internal Medicine

## 2023-02-22 VITALS — BP 113/83 | HR 73 | Temp 98.2°F | Ht 66.0 in | Wt 272.0 lb

## 2023-02-22 DIAGNOSIS — R7303 Prediabetes: Secondary | ICD-10-CM

## 2023-02-22 DIAGNOSIS — Z2821 Immunization not carried out because of patient refusal: Secondary | ICD-10-CM

## 2023-02-22 DIAGNOSIS — I10 Essential (primary) hypertension: Secondary | ICD-10-CM | POA: Diagnosis not present

## 2023-02-22 DIAGNOSIS — Z23 Encounter for immunization: Secondary | ICD-10-CM

## 2023-02-22 DIAGNOSIS — R718 Other abnormality of red blood cells: Secondary | ICD-10-CM

## 2023-02-22 DIAGNOSIS — Z6841 Body Mass Index (BMI) 40.0 and over, adult: Secondary | ICD-10-CM

## 2023-02-22 NOTE — Progress Notes (Signed)
Patient ID: Brenda Colon, female    DOB: 1981-04-05  MRN: [id number]  CC: Hypertension (HTN f/u. Valentino Hue to flu vax. )   Subjective: Brenda Colon is a 42 y.o. female who presents for chronic ds management. Her concerns today include:  Pt with hx of HTN, PreDM, obesity, OA lumbar spine, cervical radiculopathy  HM:  yes to flu vaccine today; no to COVID-19 vaccine booster  HTN:  on Norvasc 10 mg and Spironolactone 25 mg daily; usually take in the afternoons around 6 p.m Not checking BP but has access to device Limits salt Obesity/PreDM: wgh down 4 lbs since last visit with me 07/2022.  Reports no time to fit exercise in her schedule.  Only gets 30 mins for lunch.  Works 7 to 4 p.m Mon-Fri, on Sat is game day for her son and in school also.   -plans to start meal planning to avoid reaching for fast food.   Patient Active Problem List   Diagnosis Date Noted   Endometrial polyp 10/28/2022   Abnormal uterine bleeding (AUB) 10/14/2022   Morbid obesity (HCC) 02/10/2022   GERD without esophagitis 02/10/2022   Prediabetes 02/10/2022   Hyperlipemia, mixed 11/30/2020   Esophageal dysphagia 11/29/2020   Essential hypertension 11/29/2020   Intrauterine pregnancy 08/21/2020   Pregnancy with uncertain fetal viability 08/21/2020   Chronic hypertension affecting pregnancy 12/30/2017   Family history of microcephaly 09/14/2017   Fibroid 08/15/2017   Chronic hypertension during pregnancy, antepartum 03/26/2017   GERD with esophagitis 03/26/2017     Current Outpatient Medications on File Prior to Visit  Medication Sig Dispense Refill   acetaminophen (TYLENOL) 325 MG tablet Take 2 tablets (650 mg total) by mouth every 6 (six) hours as needed. 60 tablet 1   amLODipine (NORVASC) 10 MG tablet Take 1 tablet (10 mg total) by mouth daily. 90 tablet 1   hydrOXYzine (ATARAX) 10 MG tablet Take 1 tablet (10 mg total) by mouth 3 (three) times daily as needed for itching. 30 tablet 0   meloxicam (MOBIC) 15 MG  tablet Take 1 tablet (15 mg total) by mouth daily. 30 tablet 1   methocarbamol (ROBAXIN) 500 MG tablet Take 1 tablet (500 mg total) by mouth as needed for muscle spasms. 60 tablet 1   omeprazole (PRILOSEC) 20 MG capsule Take 1 capsule (20 mg total) by mouth daily. 90 capsule 1   spironolactone (ALDACTONE) 25 MG tablet Take 1 tablet (25 mg total) by mouth daily. 90 tablet 1   solifenacin (VESICARE) 5 MG tablet Take 5 mg by mouth daily. (Patient not taking: Reported on 02/22/2023)     [DISCONTINUED] NIFEdipine (PROCARDIA-XL/ADALAT-CC/NIFEDICAL-XL) 30 MG 24 hr tablet Take by mouth once per day. 30 tablet 2   No current facility-administered medications on file prior to visit.    No Known Allergies  Social History   Socioeconomic History   Marital status: Single    Spouse name: Not on file   Number of children: Not on file   Years of education: Not on file   Highest education level: Not on file  Occupational History   Not on file  Tobacco Use   Smoking status: Never   Smokeless tobacco: Never  Vaping Use   Vaping status: Never Used  Substance and Sexual Activity   Alcohol use: No   Drug use: No   Sexual activity: Not Currently    Partners: Male    Birth control/protection: None    Comment: 1st intercourse 42 yo-5 partners  Other Topics Concern   Not on file  Social History Narrative   Not on file   Social Determinants of Health   Financial Resource Strain: Not on file  Food Insecurity: No Food Insecurity (08/18/2022)   Hunger Vital Sign    Worried About Running Out of Food in the Last Year: Never true    Ran Out of Food in the Last Year: Never true  Transportation Needs: Not on file  Physical Activity: Not on file  Stress: Not on file  Social Connections: Not on file  Intimate Partner Violence: Not on file    Family History  Problem Relation Age of Onset   Diabetes Mother    Diabetes Father    Lupus Sister     Past Surgical History:  Procedure Laterality Date    CYSTECTOMY     Wrist   HYSTEROSCOPY WITH D & C N/A 10/28/2022   Procedure: DILATATION AND CURETTAGE /HYSTEROSCOPY WITH MYOSURE;  Surgeon: Lorriane Shire, MD;  Location: [facility];  Service: Gynecology;  Laterality: N/A;   INDUCED ABORTION      ROS: Review of Systems Negative except as stated above  PHYSICAL EXAM: BP 113/83 (BP Location: Left Arm, Patient Position: Sitting, Cuff Size: Large)   Pulse 73   Temp 98.2 F (36.8 C) (Oral)   Ht 5\' 6"  (1.676 m)   Wt 272 lb (123.4 kg)   LMP 02/10/2023 (Exact Date)   SpO2 98%   BMI 43.90 kg/m   Wt Readings from Last 3 Encounters:  02/22/23 272 lb (123.4 kg)  01/12/23 268 lb 6.4 oz (121.7 kg)  10/28/22 275 lb 4.8 oz (124.9 kg)  Repeat 118/86  Physical Exam  General appearance - alert, well appearing, and in no distress Mental status - normal mood, behavior, speech, dress, motor activity, and thought processes Neck - supple, no significant adenopathy Chest - clear to auscultation, no wheezes, rales or rhonchi, symmetric air entry Heart - normal rate, regular rhythm, normal S1, S2, no murmurs, rubs, clicks or gallops Extremities - peripheral pulses normal, no pedal edema, no clubbing or cyanosis      Latest Ref Rng & Units 10/28/2022   12:17 PM 03/27/2022    3:46 PM 03/03/2022    8:34 AM  CMP  Glucose 70 - 99 mg/dL 92  161  096   BUN 6 - 20 mg/dL 9  10  13    Creatinine 0.44 - 1.00 mg/dL 0.45  4.09  8.11   Sodium 135 - 145 mmol/L 140  139  138   Potassium 3.5 - 5.1 mmol/L 3.6  3.9  3.0   Chloride 98 - 111 mmol/L 102  103  106   CO2 20 - 29 mmol/L  22  25   Calcium 8.7 - 10.2 mg/dL  9.4  8.7    Lipid Panel     Component Value Date/Time   CHOL 206 (H) 11/29/2020 0935   TRIG 85 11/29/2020 0935   HDL 43 11/29/2020 0935   CHOLHDL 4.8 (H) 11/29/2020 0935   LDLCALC 148 (H) 11/29/2020 0935    CBC    Component Value Date/Time   WBC 7.0 03/03/2022 0834   RBC 4.55 03/03/2022 0834   HGB 11.9 (L) 10/28/2022  1217   HGB 13.3 01/15/2022 1448   HCT 35.0 (L) 10/28/2022 1217   HCT 41.4 01/15/2022 1448   PLT 250 03/03/2022 0834   PLT 297 01/15/2022 1448   MCV 82.0 03/03/2022 0834   MCV 81 01/15/2022  1448   MCH 26.2 03/03/2022 0834   MCHC 31.9 03/03/2022 0834   RDW 15.0 03/03/2022 0834   RDW 14.3 01/15/2022 1448   LYMPHSABS 2.3 03/03/2022 0834   LYMPHSABS 2.8 01/15/2022 1448   MONOABS 0.6 03/03/2022 0834   EOSABS 0.1 03/03/2022 0834   EOSABS 0.1 01/15/2022 1448   BASOSABS 0.1 03/03/2022 0834   BASOSABS 0.0 01/15/2022 1448    ASSESSMENT AND PLAN: 1. Essential hypertension Diastolic blood pressure slightly above goal.  Continue amlodipine 10 mg daily and spironolactone 25 mg daily.  Advised to check blood pressure at least twice a week and record the readings.  Follow-up with clinical pharmacist in 1 month for recheck. - CBC - Comprehensive metabolic panel  2. Morbid obesity (HCC) I agree with her and encouraged her to try to do better meal planning including taking her lunch from home She has set a goal to walk on Saturdays while she waits for her son at his ball practice. - Lipid panel  3. Prediabetes See #2 above - Hemoglobin A1c  4. Encounter for immunization - Flu vaccine trivalent PF, 6mos and older(Flulaval,Afluria,Fluarix,Fluzone)  5. COVID-19 vaccination declined Strongly recommend that she considers getting a COVID booster this fall.  Patient declined  Addendum 02/23/2023: Patient with microcytosis and low normal H/H.  Will add iron studies.  Patient was given the opportunity to ask questions.  Patient verbalized understanding of the plan and was able to repeat key elements of the plan.   This documentation was completed using Paediatric nurse.  Any transcriptional errors are unintentional.  Orders Placed This Encounter  Procedures   Flu vaccine trivalent PF, 6mos and older(Flulaval,Afluria,Fluarix,Fluzone)   CBC   Comprehensive metabolic panel    Lipid panel   Hemoglobin A1c     Requested Prescriptions    No prescriptions requested or ordered in this encounter    Return in about 4 months (around 06/24/2023) for BP check Luke in 4 wks.  Jonah Blue, MD, FACP

## 2023-02-22 NOTE — Patient Instructions (Signed)
Please check your blood pressure at least twice a week and record the readings.  We will have you follow-up with our clinical pharmacist in several weeks for repeat blood pressure check.  Healthy Eating, Adult Healthy eating may help you get and keep a healthy body weight, reduce the risk of chronic disease, and live a long and productive life. It is important to follow a healthy eating pattern. Your nutritional and calorie needs should be met mainly by different nutrient-rich foods. What are tips for following this plan? Reading food labels Read labels and choose the following: Reduced or low sodium products. Juices with 100% fruit juice. Foods with low saturated fats (<3 g per serving) and high polyunsaturated and monounsaturated fats. Foods with whole grains, such as whole wheat, cracked wheat, brown rice, and wild rice. Whole grains that are fortified with folic acid. This is recommended for females who are pregnant or who want to become pregnant. Read labels and do not eat or drink the following: Foods or drinks with added sugars. These include foods that contain brown sugar, corn sweetener, corn syrup, dextrose, fructose, glucose, high-fructose corn syrup, honey, invert sugar, lactose, malt syrup, maltose, molasses, raw sugar, sucrose, trehalose, or turbinado sugar. Limit your intake of added sugars to less than 10% of your total daily calories. Do not eat more than the following amounts of added sugar per day: 6 teaspoons (25 g) for females. 9 teaspoons (38 g) for males. Foods that contain processed or refined starches and grains. Refined grain products, such as white flour, degermed cornmeal, white bread, and white rice. Shopping Choose nutrient-rich snacks, such as vegetables, whole fruits, and nuts. Avoid high-calorie and high-sugar snacks, such as potato chips, fruit snacks, and candy. Use oil-based dressings and spreads on foods instead of solid fats such as butter, margarine, sour  cream, or cream cheese. Limit pre-made sauces, mixes, and "instant" products such as flavored rice, instant noodles, and ready-made pasta. Try more plant-protein sources, such as tofu, tempeh, black beans, edamame, lentils, nuts, and seeds. Explore eating plans such as the Mediterranean diet or vegetarian diet. Try heart-healthy dips made with beans and healthy fats like hummus and guacamole. Vegetables go great with these. Cooking Use oil to saut or stir-fry foods instead of solid fats such as butter, margarine, or lard. Try baking, boiling, grilling, or broiling instead of frying. Remove the fatty part of meats before cooking. Steam vegetables in water or broth. Meal planning  At meals, imagine dividing your plate into fourths: One-half of your plate is fruits and vegetables. One-fourth of your plate is whole grains. One-fourth of your plate is protein, especially lean meats, poultry, eggs, tofu, beans, or nuts. Include low-fat dairy as part of your daily diet. Lifestyle Choose healthy options in all settings, including home, work, school, restaurants, or stores. Prepare your food safely: Wash your hands after handling raw meats. Where you prepare food, keep surfaces clean by regularly washing with hot, soapy water. Keep raw meats separate from ready-to-eat foods, such as fruits and vegetables. Cook seafood, meat, poultry, and eggs to the recommended temperature. Get a food thermometer. Store foods at safe temperatures. In general: Keep cold foods at 42F (4.4C) or below. Keep hot foods at 142F (60C) or above. Keep your freezer at [facility] (-17.8C) or below. Foods are not safe to eat if they have been between the temperatures of 40-142F (4.4-60C) for more than 2 hours. What foods should I eat? Fruits Aim to eat 1-2 cups of fresh, canned (in  natural juice), or frozen fruits each day. One cup of fruit equals 1 small apple, 1 large banana, 8 large strawberries, 1 cup (237 g) canned  fruit,  cup (82 g) dried fruit, or 1 cup (240 mL) 100% juice. Vegetables Aim to eat 2-4 cups of fresh and frozen vegetables each day, including different varieties and colors. One cup of vegetables equals 1 cup (91 g) broccoli or cauliflower florets, 2 medium carrots, 2 cups (150 g) raw, leafy greens, 1 large tomato, 1 large bell pepper, 1 large sweet potato, or 1 medium white potato. Grains Aim to eat 5-10 ounce-equivalents of whole grains each day. Examples of 1 ounce-equivalent of grains include 1 slice of bread, 1 cup (40 g) ready-to-eat cereal, 3 cups (24 g) popcorn, or  cup (93 g) cooked rice. Meats and other proteins Try to eat 5-7 ounce-equivalents of protein each day. Examples of 1 ounce-equivalent of protein include 1 egg,  oz nuts (12 almonds, 24 pistachios, or 7 walnut halves), 1/4 cup (90 g) cooked beans, 6 tablespoons (90 g) hummus or 1 tablespoon (16 g) peanut butter. A cut of meat or fish that is the size of a deck of cards is about 3-4 ounce-equivalents (85 g). Of the protein you eat each week, try to have at least 8 sounce (227 g) of seafood. This is about 2 servings per week. This includes salmon, trout, herring, sardines, and anchovies. Dairy Aim to eat 3 cup-equivalents of fat-free or low-fat dairy each day. Examples of 1 cup-equivalent of dairy include 1 cup (240 mL) milk, 8 ounces (250 g) yogurt, 1 ounces (44 g) natural cheese, or 1 cup (240 mL) fortified soy milk. Fats and oils Aim for about 5 teaspoons (21 g) of fats and oils per day. Choose monounsaturated fats, such as canola and olive oils, mayonnaise made with olive oil or avocado oil, avocados, peanut butter, and most nuts, or polyunsaturated fats, such as sunflower, corn, and soybean oils, walnuts, pine nuts, sesame seeds, sunflower seeds, and flaxseed. Beverages Aim for 6 eight-ounce glasses of water per day. Limit coffee to 3-5 eight-ounce cups per day. Limit caffeinated beverages that have added calories, such as  soda and energy drinks. If you drink alcohol: Limit how much you have to: 0-1 drink a day if you are female. 0-2 drinks a day if you are female. Know how much alcohol is in your drink. In the [place] one drink is one 12 oz bottle of beer (355 mL), one 5 oz glass of wine (148 mL), or one 1 oz glass of hard liquor (44 mL). Seasoning and other foods Try not to add too much salt to your food. Try using herbs and spices instead of salt. Try not to add sugar to food. This information is based on [place] nutrition guidelines. To learn more, visit [employer]posableN[employer].be. Exact amounts may vary. You may need different amounts. This information is not intended to replace advice given to you by your health care provider. Make sure you discuss any questions you have with your health care provider. Document Revised: 02/23/2022 Document Reviewed: 02/23/2022 Elsevier Patient Education  2024 [employer].

## 2023-02-23 LAB — CBC
Hematocrit: 38.2 % (ref 34.0–46.6)
Hemoglobin: 11.1 g/dL (ref 11.1–15.9)
MCH: 21.9 pg — ABNORMAL LOW (ref 26.6–33.0)
MCHC: 29.1 g/dL — ABNORMAL LOW (ref 31.5–35.7)
MCV: 75 fL — ABNORMAL LOW (ref 79–97)
Platelets: 356 10*3/uL (ref 150–450)
RBC: 5.08 x10E6/uL (ref 3.77–5.28)
RDW: 16 % — ABNORMAL HIGH (ref 11.7–15.4)
WBC: 7.5 10*3/uL (ref 3.4–10.8)

## 2023-02-23 LAB — COMPREHENSIVE METABOLIC PANEL
ALT: 11 IU/L (ref 0–32)
AST: 16 IU/L (ref 0–40)
Albumin: 4.1 g/dL (ref 3.9–4.9)
Alkaline Phosphatase: 93 IU/L (ref 44–121)
BUN/Creatinine Ratio: 12 (ref 9–23)
BUN: 10 mg/dL (ref 6–24)
Bilirubin Total: 0.2 mg/dL (ref 0.0–1.2)
CO2: 24 mmol/L (ref 20–29)
Calcium: 9.4 mg/dL (ref 8.7–10.2)
Chloride: 100 mmol/L (ref 96–106)
Creatinine, Ser: 0.81 mg/dL (ref 0.57–1.00)
Globulin, Total: 2.8 g/dL (ref 1.5–4.5)
Glucose: 72 mg/dL (ref 70–99)
Potassium: 4.9 mmol/L (ref 3.5–5.2)
Sodium: 136 mmol/L (ref 134–144)
Total Protein: 6.9 g/dL (ref 6.0–8.5)
eGFR: 92 mL/min/{1.73_m2} (ref >59)

## 2023-02-23 LAB — LIPID PANEL
Chol/HDL Ratio: 4 ratio (ref 0.0–4.4)
Cholesterol, Total: 208 mg/dL — ABNORMAL HIGH (ref 100–199)
HDL: 52 mg/dL (ref >39)
LDL Chol Calc (NIH): 144 mg/dL — ABNORMAL HIGH (ref 0–99)
Triglycerides: 69 mg/dL (ref 0–149)
VLDL Cholesterol Cal: 12 mg/dL (ref 5–40)

## 2023-02-23 LAB — HEMOGLOBIN A1C
Est. average glucose Bld gHb Est-mCnc: 128 mg/dL
Hgb A1c MFr Bld: 6.1 % — ABNORMAL HIGH (ref 4.8–5.6)

## 2023-02-23 NOTE — Addendum Note (Signed)
Addended by: Jonah Blue B on: 02/23/2023 09:16 AM   Modules accepted: Orders

## 2023-02-24 ENCOUNTER — Other Ambulatory Visit: Payer: Self-pay | Admitting: Internal Medicine

## 2023-02-24 ENCOUNTER — Encounter: Payer: Self-pay | Admitting: Internal Medicine

## 2023-02-24 DIAGNOSIS — E611 Iron deficiency: Secondary | ICD-10-CM | POA: Insufficient documentation

## 2023-02-24 MED ORDER — FERROUS SULFATE 325 (65 FE) MG PO TABS: 325.0000 mg | ORAL_TABLET | Freq: Every day | ORAL | 0 refills | Status: AC

## 2023-03-03 LAB — IRON AND TIBC
Iron Saturation: 6 % — CL (ref 15–55)
Iron: 20 ug/dL — ABNORMAL LOW (ref 27–159)
Total Iron Binding Capacity: 358 ug/dL (ref 250–450)
UIBC: 338 ug/dL (ref 131–425)

## 2023-03-03 LAB — SPECIMEN STATUS REPORT

## 2023-03-03 LAB — FERRITIN: Ferritin: 7 ng/mL — ABNORMAL LOW (ref 15–150)

## 2023-03-26 ENCOUNTER — Encounter: Payer: Self-pay | Admitting: Pharmacist

## 2023-03-26 ENCOUNTER — Ambulatory Visit: Payer: Medicaid Other | Attending: Internal Medicine | Admitting: Pharmacist

## 2023-03-26 VITALS — BP 118/86 | HR 73

## 2023-03-26 DIAGNOSIS — I10 Essential (primary) hypertension: Secondary | ICD-10-CM | POA: Diagnosis not present

## 2023-03-26 MED ORDER — PROPRANOLOL HCL 10 MG PO TABS: 10.0000 mg | ORAL_TABLET | Freq: Two times a day (BID) | ORAL | 0 refills | Status: DC

## 2023-03-26 NOTE — Progress Notes (Signed)
S:     No chief complaint on file.  42 y.o. female who presents for hypertension evaluation, education, and management.  PMH is significant for HTN, GERD, abnormal uterine bleeding, preDM, HLD.  Patient was referred and last seen by Primary Care Provider, Dr. Laural Benes, on 02/22/2023. BP was 113/83 at that visit and she was instructed to come in for a recheck.    Today, patient arrives in good spirits and presents without assistance. Denies dizziness, blurred vision, swelling. Does endorse HA. Tells me today that has been something she has always managed. Typically, she gets what she describes as a tension-type HA within a couple of days of her menstrual period. In the past, she would take an ibuprofen or BC Powder and the HA would resolve. However, she tells me the HA is now occurring everyday over the past week. Denies an NV. No light sensitivity. However, I reviewed her charts and she has reported that they can be unilateral (left) in nature.   Patient reports hypertension is longstanding.    Family/Social history:  Fhx: DM Tobacco: never smoker  Alcohol: none reported   Medication adherence reported. Patient has taken BP medications today.   Current antihypertensives include:  -Amlodipine 10 mg daily -Spironolactone 25 mg daily   Reported home BP readings: none  Patient reported dietary habits:  -Compliant with sodium restriction -Denies drinking excessive caffeine   Patient-reported exercise habits: none reported  O:  Vitals:   03/26/23 1355  BP: 118/86  Pulse: 73     Last 3 Office BP readings: BP Readings from Last 3 Encounters:  03/26/23 118/86  02/22/23 113/83  01/12/23 (!) 132/92    BMET    Component Value Date/Time   NA 136 02/22/2023 1530   K 4.9 02/22/2023 1530   CL 100 02/22/2023 1530   CO2 24 02/22/2023 1530   GLUCOSE 72 02/22/2023 1530   GLUCOSE 92 10/28/2022 1217   BUN 10 02/22/2023 1530   CREATININE 0.81 02/22/2023 1530   CALCIUM 9.4  02/22/2023 1530   GFRNONAA >60 03/03/2022 0834   GFRAA 101 03/07/2020 0919    Renal function: CrCl cannot be calculated (Patient's most recent lab result is older than the maximum 21 days allowed.).  Clinical ASCVD: No  The 10-year ASCVD risk score (Arnett DK, et al., 2019) is: 1.5%   Values used to calculate the score:     Age: 72 years     Sex: Female     Is Non-Hispanic African American: Yes     Diabetic: No     Tobacco smoker: No     Systolic Blood Pressure: 118 mmHg     Is BP treated: Yes     HDL Cholesterol: 52 mg/dL     Total Cholesterol: 208 mg/dL  Patient is participating in a Managed Medicaid Plan:  Yes    A/P: Hypertension diagnosed currently with a DBP above goal on current medications. BP goal < 130/80  mmHg. Medication adherence appears appropriate. I think propranolol may give her some additional benefit here. While it's effect on BP may be minimal, it could help her with the movement she needs to get to goal. Additionally, we can try this for HA prevention. Will start low dose BID propranolol and have her return in 1 month.  -Started propranolol 10mg  BID.  -Continued amlodipine and spironolactone at current dose.  -Patient educated on purpose, proper use, and potential adverse effects of propranolol.  -Counseled on lifestyle modifications for blood pressure control including  reduced dietary sodium, increased exercise, adequate sleep. -Encouraged patient to check BP at home and bring log of readings to next visit. Counseled on proper use of home BP cuff.   Results reviewed and written information provided.    Written patient instructions provided. Patient verbalized understanding of treatment plan.  Total time in face to face counseling 30 minutes.    Follow-up:  Pharmacist in 1 month.  Butch Penny, PharmD, Patsy [place], CPP Clinical Pharmacist [facility] [phone]

## 2023-04-27 ENCOUNTER — Encounter: Payer: Self-pay | Admitting: Internal Medicine

## 2023-04-29 NOTE — Progress Notes (Deleted)
   S:    42 y.o. female who presents for hypertension evaluation, education, and management.  PMH is significant for HTN, GERD, abnormal uterine bleeding, preDM, HLD. Patient was referred and last seen by Primary Care Provider, Dr. Laural Benes, on 02/22/2023. BP was 113/83 at that visit. Last saw PharmD on 03/26/23 for a BP recheck. DBP was above goal (86) and patient reported frequent headaches. Propranolol was initiated for further BP control and headache prevention.  Today, patient arrives in good spirits and presents without assistance. Reports adherence with BP medications and no missed doses. Patient has taken BP medications today. Denies dizziness, blurred vision, swelling. Does endorse HA. Tells me today that has been something she has always managed.  Tension-type HA within couple days of menstrual period HA occurring everyday over past week Denies NV and light sensitivity. Unilateral  Patient reports hypertension is longstanding.   Family/Social history:  Fhx: DM Tobacco: never smoker  Alcohol: none reported   Current antihypertensives include:  -Amlodipine 10 mg daily -Spironolactone 25 mg daily  -Propranolol 10 mg BID  Reported home BP readings: ***  Patient reported dietary habits:  -Compliant with sodium restriction -Denies drinking excessive caffeine   Patient-reported exercise habits: none reported  O:  There were no vitals filed for this visit.  Last 3 Office BP readings: BP Readings from Last 3 Encounters:  03/26/23 118/86  02/22/23 113/83  01/12/23 (!) 132/92   BMET    Component Value Date/Time   NA 136 02/22/2023 1530   K 4.9 02/22/2023 1530   CL 100 02/22/2023 1530   CO2 24 02/22/2023 1530   GLUCOSE 72 02/22/2023 1530   GLUCOSE 92 10/28/2022 1217   BUN 10 02/22/2023 1530   CREATININE 0.81 02/22/2023 1530   CALCIUM 9.4 02/22/2023 1530   GFRNONAA >60 03/03/2022 0834   GFRAA 101 03/07/2020 0919   Renal function: CrCl cannot be calculated (Patient's  most recent lab result is older than the maximum 21 days allowed.).  Clinical ASCVD: No  The 10-year ASCVD risk score (Arnett DK, et al., 2019) is: 1.5%   Values used to calculate the score:     Age: 31 years     Sex: Female     Is Non-Hispanic African American: Yes     Diabetic: No     Tobacco smoker: No     Systolic Blood Pressure: 118 mmHg     Is BP treated: Yes     HDL Cholesterol: 52 mg/dL     Total Cholesterol: 208 mg/dL  Patient is participating in a Managed Medicaid Plan:  Yes   A/P: Hypertension currently controlled based on office BP. BP goal < 130/80  mmHg. Medication adherence appears appropriate. Propranolol noted to be helping/not helping with headaches. *** Holding off on RAS inhibitors at this time given patient is interested in having more children. -Continue propranolol 10mg  BID -Continue amlodipine 10mg  daily -Continue spironolactone 25mg  daily -Counseled on lifestyle modifications for blood pressure control including reduced dietary sodium, increased exercise, adequate sleep. -Encouraged patient to check BP at home and bring log of readings to next visit. Counseled on proper use of home BP cuff.   Results reviewed and written information provided.    Written patient instructions provided. Patient verbalized understanding of treatment plan.  Total time in face to face counseling *** minutes.    Follow-up:  Pharmacist: *** PCP: 06/29/23  Nicole Kindred, PharmD PGY1 Pharmacy Resident 04/29/2023 10:38 PM

## 2023-04-30 ENCOUNTER — Ambulatory Visit: Payer: Medicaid Other | Admitting: Pharmacist

## 2023-05-03 ENCOUNTER — Encounter (HOSPITAL_COMMUNITY): Payer: Self-pay | Admitting: Cardiology

## 2023-05-03 ENCOUNTER — Other Ambulatory Visit (HOSPITAL_COMMUNITY): Payer: Medicaid Other

## 2023-06-29 ENCOUNTER — Telehealth (HOSPITAL_BASED_OUTPATIENT_CLINIC_OR_DEPARTMENT_OTHER): Payer: Medicaid Other | Admitting: Internal Medicine

## 2023-06-29 ENCOUNTER — Ambulatory Visit: Payer: Medicaid Other | Admitting: Internal Medicine

## 2023-06-29 DIAGNOSIS — G8929 Other chronic pain: Secondary | ICD-10-CM | POA: Diagnosis not present

## 2023-06-29 DIAGNOSIS — E611 Iron deficiency: Secondary | ICD-10-CM

## 2023-06-29 DIAGNOSIS — M549 Dorsalgia, unspecified: Secondary | ICD-10-CM

## 2023-06-29 DIAGNOSIS — I10 Essential (primary) hypertension: Secondary | ICD-10-CM | POA: Diagnosis not present

## 2023-06-29 MED ORDER — SPIRONOLACTONE 25 MG PO TABS
25.0000 mg | ORAL_TABLET | Freq: Every day | ORAL | 1 refills | Status: AC
Start: 2023-06-29 — End: ?

## 2023-06-29 MED ORDER — MELOXICAM 15 MG PO TABS
15.0000 mg | ORAL_TABLET | Freq: Every day | ORAL | 5 refills | Status: AC
Start: 2023-06-29 — End: ?

## 2023-06-29 NOTE — Progress Notes (Unsigned)
Patient ID: Brenda Colon, female   DOB: 07/18/1980, 43 y.o.   MRN: [id number] Virtual Visit via Video Note  I connected with Brenda Colon on 06/29/2023 at 5:02 PM by a video enabled telemedicine application and verified that I am speaking with the correct person using two identifiers.  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 Pt was able to find a secluded area for privacy of this visit Provider: Office   I discussed the limitations of evaluation and management by telemedicine and the availability of in person appointments. The patient expressed understanding and agreed to proceed.  History of Present Illness: Pt with hx of HTN, PreDM, obesity, OA lumbar spine, cervical radiculopathy   Discussed the use of AI scribe software for clinical note transcription with the patient, who gave verbal consent to proceed.  History of Present Illness   The patient, with a history of hypertension, prediabetes, and iron deficiency anemia, presents with back pain. She describes the pain as affecting her entire back, from the top to the bottom, and on both sides. The pain is worse in rainy weather and interferes with her daily activities, but does not disturb her sleep.  X-ray of the lumbar spine done a little over a year ago revealed minimal degenerative joint changes of the lower lumbar spine.  She has been managing the pain with over-the-counter ibuprofen, but reports that it provides little relief.  HTN:  The patient's hypertension is managed with amlodipine 10 mg and spironolactone 25 mg daily, but she has been out of spironolactone for a week. She reports that her blood pressure was slightly elevated at 131/90 when she checked it today, but it is usually around 120/80.  Obesity/PreDM:  In terms of her prediabetes, the patient reports that she did not overindulge during the holidays and believes her weight has remained stable.  Weight on last visit 4 mths ago was 272 pounds with BMI of 43.9.  She has not  weighed herself recently.  IDA:  The patient has been taking iron supplements for her iron deficiency anemia, but stopped a few weeks ago because she lost her appetite for eggs, which is her usual breakfast. Iron for some reason decreases her appetite for eggs.  She would like to have her iron levels and blood count checked again.     Current Outpatient Medications:    acetaminophen (TYLENOL) 325 MG tablet, Take 2 tablets (650 mg total) by mouth every 6 (six) hours as needed., Disp: 60 tablet, Rfl: 1   amLODipine (NORVASC) 10 MG tablet, Take 1 tablet (10 mg total) by mouth daily., Disp: 90 tablet, Rfl: 1   ferrous sulfate 325 (65 FE) MG tablet, Take 1 tablet (325 mg total) by mouth daily with breakfast., Disp: 100 tablet, Rfl: 0   hydrOXYzine (ATARAX) 10 MG tablet, Take 1 tablet (10 mg total) by mouth 3 (three) times daily as needed for itching., Disp: 30 tablet, Rfl: 0   meloxicam (MOBIC) 15 MG tablet, Take 1 tablet (15 mg total) by mouth daily., Disp: 30 tablet, Rfl: 1   methocarbamol (ROBAXIN) 500 MG tablet, Take 1 tablet (500 mg total) by mouth as needed for muscle spasms., Disp: 60 tablet, Rfl: 1   omeprazole (PRILOSEC) 20 MG capsule, Take 1 capsule (20 mg total) by mouth daily., Disp: 90 capsule, Rfl: 1   propranolol (INDERAL) 10 MG tablet, Take 1 tablet (10 mg total) by mouth 2 (two) times daily., Disp: 180 tablet, Rfl: 0   solifenacin (VESICARE) 5 MG  tablet, Take 5 mg by mouth daily. (Patient not taking: Reported on 02/22/2023), Disp: , Rfl:    spironolactone (ALDACTONE) 25 MG tablet, Take 1 tablet (25 mg total) by mouth daily., Disp: 90 tablet, Rfl: 1    Observations/Objective: Middle-age African-American female in NAD.  Lab Results  Component Value Date   WBC 7.5 02/22/2023   HGB 11.1 02/22/2023   HCT 38.2 02/22/2023   MCV 75 (L) 02/22/2023   PLT 356 02/22/2023   Lab Results  Component Value Date   IRON 20 (L) 02/22/2023   TIBC 358 02/22/2023   FERRITIN 7 (L) 02/22/2023      Chemistry      Component Value Date/Time   NA 136 02/22/2023 1530   K 4.9 02/22/2023 1530   CL 100 02/22/2023 1530   CO2 24 02/22/2023 1530   BUN 10 02/22/2023 1530   CREATININE 0.81 02/22/2023 1530      Component Value Date/Time   CALCIUM 9.4 02/22/2023 1530   ALKPHOS 93 02/22/2023 1530   AST 16 02/22/2023 1530   ALT 11 02/22/2023 1530   BILITOT 0.2 02/22/2023 1530       Assessment and Plan: 1. Essential hypertension (Primary) Reported blood pressure today was not at goal.  She has been out of spironolactone.  Refills sent.  Continue spironolactone and Norvasc.  Continue to monitor blood pressure with goal being 130/80 or lower. - spironolactone (ALDACTONE) 25 MG tablet; Take 1 tablet (25 mg total) by mouth daily.  Dispense: 90 tablet; Refill: 1  2. Morbid obesity (HCC) Commended her on trying to eat healthy and not gaining weight over the holidays.  Encouraged to continue healthy eating habits.  Try to move as much as she can.  3. Iron deficiency She will come to the lab sometime this week or next week to have CBC and iron studies - CBC; Future - Iron, TIBC and Ferritin Panel; Future  4. Chronic bilateral back pain, unspecified back location We will have her stop ibuprofen and use meloxicam 15 mg daily instead with Tylenol 500 mg twice a day which can be purchased over-the-counter - meloxicam (MOBIC) 15 MG tablet; Take 1 tablet (15 mg total) by mouth daily.  Dispense: 30 tablet; Refill: 5   Follow Up Instructions: 4 mths   I discussed the assessment and treatment plan with the patient. The patient was provided an opportunity to ask questions and all were answered. The patient agreed with the plan and demonstrated an understanding of the instructions.   The patient was advised to call back or seek an in-person evaluation if the symptoms worsen or if the condition fails to improve as anticipated.  I spent 14 minutes dedicated to the care of this patient on the date of  this encounter to include previsit review of my last note, face-to-face time with patient discussing diagnosis and management and post visit entering of orders.  This note has been created with Education officer, environmental. Any transcriptional errors are unintentional.  Jonah Blue, MD

## 2023-06-30 ENCOUNTER — Encounter: Payer: Self-pay | Admitting: Internal Medicine

## 2023-07-12 ENCOUNTER — Ambulatory Visit: Payer: Medicaid Other | Attending: Internal Medicine

## 2023-07-12 DIAGNOSIS — E611 Iron deficiency: Secondary | ICD-10-CM | POA: Diagnosis not present

## 2023-07-13 ENCOUNTER — Encounter: Payer: Self-pay | Admitting: Internal Medicine

## 2023-07-13 LAB — CBC
Hematocrit: 40.1 % (ref 34.0–46.6)
Hemoglobin: 12.9 g/dL (ref 11.1–15.9)
MCH: 26.8 pg (ref 26.6–33.0)
MCHC: 32.2 g/dL (ref 31.5–35.7)
MCV: 83 fL (ref 79–97)
Platelets: 245 10*3/uL (ref 150–450)
RBC: 4.82 x10E6/uL (ref 3.77–5.28)
RDW: 13.8 % (ref 11.7–15.4)
WBC: 7.9 10*3/uL (ref 3.4–10.8)

## 2023-07-13 LAB — IRON,TIBC AND FERRITIN PANEL
Ferritin: 15 ng/mL (ref 15–150)
Iron Saturation: 16 % (ref 15–55)
Iron: 50 ug/dL (ref 27–159)
Total Iron Binding Capacity: 316 ug/dL (ref 250–450)
UIBC: 266 ug/dL (ref 131–425)

## 2023-07-22 ENCOUNTER — Other Ambulatory Visit: Payer: Self-pay | Admitting: Nurse Practitioner

## 2023-07-22 DIAGNOSIS — K219 Gastro-esophageal reflux disease without esophagitis: Secondary | ICD-10-CM

## 2023-07-22 NOTE — Telephone Encounter (Signed)
Requested by interface surescripts.  Requested Prescriptions  Pending Prescriptions Disp Refills   omeprazole (PRILOSEC) 20 MG capsule [Pharmacy Med Name: OMEPRAZOLE 20MG  CAPSULES] 90 capsule 1    Sig: TAKE 1 CAPSULE(20 MG) BY MOUTH DAILY     Gastroenterology: Proton Pump Inhibitors Passed - 07/22/2023  4:16 PM      Passed - Valid encounter within last 12 months    Recent Outpatient Visits           3 weeks ago Essential hypertension   [facility] Dept Of [place]. [facility] Marcine Matar, MD   3 months ago Essential hypertension   [facility] Dept Of [place]. [facility] Lois Huxley, Cornelius Moras, RPH-CPP   5 months ago Essential hypertension   [facility] Dept Of [place]. [facility] Marcine Matar, MD   6 months ago Accidental bee sting   [facility] A Dept Of [place]. [facility] Claiborne Rigg, NP   1 year ago Essential hypertension   [facility] - [facility] Dept Of [place]. [facility] Marcine Matar, MD

## 2023-07-27 ENCOUNTER — Ambulatory Visit: Payer: Self-pay | Admitting: Internal Medicine

## 2023-07-27 ENCOUNTER — Encounter: Payer: Self-pay | Admitting: Internal Medicine

## 2023-07-27 ENCOUNTER — Telehealth (HOSPITAL_BASED_OUTPATIENT_CLINIC_OR_DEPARTMENT_OTHER): Payer: Self-pay | Admitting: Internal Medicine

## 2023-07-27 VITALS — BP 133/91

## 2023-07-27 DIAGNOSIS — I10 Essential (primary) hypertension: Secondary | ICD-10-CM | POA: Diagnosis not present

## 2023-07-27 DIAGNOSIS — G43009 Migraine without aura, not intractable, without status migrainosus: Secondary | ICD-10-CM | POA: Diagnosis not present

## 2023-07-27 MED ORDER — SUMATRIPTAN SUCCINATE 50 MG PO TABS
ORAL_TABLET | ORAL | 2 refills | Status: AC
Start: 2023-07-27 — End: ?

## 2023-07-27 MED ORDER — PROPRANOLOL HCL 20 MG PO TABS: 20.0000 mg | ORAL_TABLET | Freq: Two times a day (BID) | ORAL | 1 refills | Status: DC

## 2023-07-27 NOTE — Telephone Encounter (Signed)
Copied from CRM [id number]. Topic: Clinical - Medication Question >> Jul 27, 2023 10:35 AM Clayton Bibles wrote: Reason for CRM: Brenda Colon wants to see if Dr. Laural Benes will call her in migraine medication that she has taken before. She forgot the name of the medication.  Call [phone] with questions or message through MyChart  Chief Complaint: Migraine Symptoms: Headache, nausea, eye pressure Frequency: Since Sunday Pertinent Negatives: Patient denies relief Disposition: [] ED /[] Urgent Care (no appt availability in office) / [x] Appointment(In office/virtual)/ []  [facility]/ [] Home Care/ [] Refused Recommended Disposition /[]  Mobile Bus/ []  Follow-up with PCP Additional Notes: Patient called in to report a migraine that has been ongoing since Sunday. Patient stated that she has had migraines in the past, but not one that has lasted this long. Patient stated her provider has prescribed medication for migraines before, but it has been a long time and she cannot recall the name of the medication. Patient has taken Excedrin and denied relief. Patient stated the pain is mostly in her forehead and eyes. Patient stated the pain has made her nauseous. This RN advised patient to see a provider within 4 hours. Patient is at work and requested a virtual visit. This RN scheduled same day virtual visit with her PCP. This RN advised patient to call back if symptoms worsen. Patient complied.   Reason for Disposition  [1] SEVERE headache (e.g., excruciating) AND [2] not improved after 2 hours of pain medicine  Answer Assessment - Initial Assessment Questions 1. LOCATION: "Where does it hurt?"      Forehead and eyes 2. ONSET: "When did the headache start?" (Minutes, hours or days)      Sunday 3. PATTERN: "Does the pain come and go, or has it been constant since it started?"     States pain has been constant 4. SEVERITY: "How bad is the pain?" and "What does it keep you from doing?"  (e.g., Scale  1-10; mild, moderate, or severe)   - MILD (1-3): doesn't interfere with normal activities    - MODERATE (4-7): interferes with normal activities or awakens from sleep    - SEVERE (8-10): excruciating pain, unable to do any normal activities        States pain is 10 5. RECURRENT SYMPTOM: "Have you ever had headaches before?" If Yes, ask: "When was the last time?" and "What happened that time?"      Cannot recall 6. CAUSE: "What do you think is causing the headache?"     Migraine 7. MIGRAINE: "Have you been diagnosed with migraine headaches?" If Yes, ask: "Is this headache similar?"      Yes, states she has had migraines in the past  8. HEAD INJURY: "Has there been any recent injury to the head?"      Denies 9. OTHER SYMPTOMS: "Do you have any other symptoms?" (fever, stiff neck, eye pain, sore throat, cold symptoms)     Nausea, pressure behind eyes 10. PREGNANCY: "Is there any chance you are pregnant?" "When was your last menstrual period?"     2 weeks ago  Protocols used: Headache-A-AH

## 2023-07-27 NOTE — Telephone Encounter (Signed)
 Noted

## 2023-07-27 NOTE — Progress Notes (Signed)
Patient ID: Brenda Colon, female   DOB: 1981-03-08, 43 y.o.   MRN: [id number] Virtual Visit via Video Note  I connected with Kathi Der on 07/27/2023 at 2:51 PM by a video enabled telemedicine application and verified that I am speaking with the correct person using two identifiers.  Location: Patient: work in a secluded spot  provider: Office   I discussed the limitations of evaluation and management by telemedicine and the availability of in person appointments. The patient expressed understanding and agreed to proceed.  History of Present Illness: Pt with hx of HTN, PreDM, obesity, OA lumbar spine, cervical radiculopathy    Discussed the use of AI scribe software for clinical note transcription with the patient, who gave verbal consent to proceed.  History of Present Illness   pt presents with a severe, pressure-like headache that has persisted for three days. Reports being dx with migraines in the past but has never had one lasting this long. The headache is located in the front of the head and behind the eyes. She describes associated symptoms of nausea and photophobia, but denies vomiting and blurred vision. She reports dizziness with movement yesterday. The patient has tried over-the-counter ibuprofen and Excedrin Migraine, but these have not provided relief. She notes that her headaches are often triggered by her menstrual cycle, but she is not currently menstruating. The patient has a history of migraines during pregnancy in 2019-2020, for which she was prescribed a medication she cannot recall.  She tells me that it was also prescribed 1 time when she was seen in the emergency room.  I was able to look through her chart and see that it was Imitrex.  She reports that Imitrex is most helpful but has not had a prescription for some time. The patient is currently taking spironolactone, amlodipine, and propranolol consistently.  She has not checked her blood pressure in a while.      Outpatient Encounter Medications as of 07/27/2023  Medication Sig   acetaminophen (TYLENOL) 325 MG tablet Take 2 tablets (650 mg total) by mouth every 6 (six) hours as needed.   amLODipine (NORVASC) 10 MG tablet Take 1 tablet (10 mg total) by mouth daily.   ferrous sulfate 325 (65 FE) MG tablet Take 1 tablet (325 mg total) by mouth daily with breakfast.   hydrOXYzine (ATARAX) 10 MG tablet Take 1 tablet (10 mg total) by mouth 3 (three) times daily as needed for itching.   meloxicam (MOBIC) 15 MG tablet Take 1 tablet (15 mg total) by mouth daily.   methocarbamol (ROBAXIN) 500 MG tablet Take 1 tablet (500 mg total) by mouth as needed for muscle spasms.   omeprazole (PRILOSEC) 20 MG capsule TAKE 1 CAPSULE(20 MG) BY MOUTH DAILY   propranolol (INDERAL) 10 MG tablet Take 1 tablet (10 mg total) by mouth 2 (two) times daily.   solifenacin (VESICARE) 5 MG tablet Take 5 mg by mouth daily. (Patient not taking: Reported on 02/22/2023)   spironolactone (ALDACTONE) 25 MG tablet Take 1 tablet (25 mg total) by mouth daily.   [DISCONTINUED] NIFEdipine (PROCARDIA-XL/ADALAT-CC/NIFEDICAL-XL) 30 MG 24 hr tablet Take by mouth once per day.   No facility-administered encounter medications on file as of 07/27/2023.    Observations/Objective: Patient was able to check her blood pressure at the time of this visit and gave a reading of 133/91  Assessment and Plan: 1. Migraine without aura and without status migrainosus, not intractable (Primary) Patient is on propranolol 10 mg twice a day.  I recommend that we increase the dose to 20 mg twice a day for better control of blood pressure and also for prophylaxis for her headaches Refill given on sumatriptan.  I went over with her how to take the medicine. Advised to follow-up if no improvement - SUMAtriptan (IMITREX) 50 MG tablet; 1 tab PO at start of headache. May repeat in 2 hours if headache persists or recurs. Max 2 tabs/24 hr  Dispense: 10 tablet; Refill: 2 -  propranolol (INDERAL) 20 MG tablet; Take 1 tablet (20 mg total) by mouth 2 (two) times daily.  Dispense: 180 tablet; Refill: 1  2. Essential hypertension See #1 above - propranolol (INDERAL) 20 MG tablet; Take 1 tablet (20 mg total) by mouth 2 (two) times daily.  Dispense: 180 tablet; Refill: 1   Follow Up Instructions: PRN if HA does not resolve with treatment offered.   I discussed the assessment and treatment plan with the patient. The patient was provided an opportunity to ask questions and all were answered. The patient agreed with the plan and demonstrated an understanding of the instructions.   The patient was advised to call back or seek an in-person evaluation if the symptoms worsen or if the condition fails to improve as anticipated.  I spent  16 minutes dedicated to the care of this patient on the date of this encounter to include previsit review of of chart, face-to-face time with patient discussing diagnosis and management and post visit entering of orders.  This note has been created with Education officer, environmental. Any transcriptional errors are unintentional.  Jonah Blue, MD

## 2023-09-20 ENCOUNTER — Ambulatory Visit: Payer: Self-pay

## 2023-09-20 ENCOUNTER — Ambulatory Visit (HOSPITAL_COMMUNITY)
Admission: EM | Admit: 2023-09-20 | Discharge: 2023-09-20 | Disposition: A | Discharge status: Home / Self Care | Attending: Family Medicine | Admitting: Family Medicine

## 2023-09-20 ENCOUNTER — Encounter (HOSPITAL_COMMUNITY): Payer: Self-pay | Admitting: *Deleted

## 2023-09-20 DIAGNOSIS — I889 Nonspecific lymphadenitis, unspecified: Secondary | ICD-10-CM

## 2023-09-20 LAB — POCT URINE PREGNANCY: Preg Test, Ur: NEGATIVE

## 2023-09-20 MED ORDER — AMOXICILLIN-POT CLAVULANATE 875-125 MG PO TABS: 1.0000 | ORAL_TABLET | Freq: Two times a day (BID) | ORAL | 0 refills | Status: AC

## 2023-09-20 NOTE — Telephone Encounter (Signed)
 Copied from CRM [id number]. Topic: Clinical - Red Word Triage >> Sep 20, 2023  9:20 AM Rosamond Comes wrote: Red Word that prompted transfer to Nurse Triage: patient calling in, has lump right side of neck area, gotten bigger, hurts to touch, hurts to swallow, noticed this about 15 minutes ago  Chief Complaint: lump to right side of neck Symptoms: painful to touch Frequency: started today Pertinent Negatives: Patient denies fever, difficulty swallowing Disposition: [] ED /[x] Urgent Care (no appt availability in office) / [] Appointment(In office/virtual)/ []  [place] [facility]/ [] Home Care/ [] Refused Recommended Disposition /[] [place] [facility]/ []  Follow-up with PCP Additional Notes: per protocol patient instructed to go to UC; care advice given, denies questions; instructed to go to ER if becomes worse.   Reason for Disposition  [1] Swelling is painful to touch AND [2] no fever  Answer Assessment - Initial Assessment Questions 1. APPEARANCE of SWELLING: "What does it look like?"     Right side of neck area 2. SIZE: "How large is the swelling?" (e.g., inches, cm; or compare to size of pinhead, tip of pen, eraser, coin, pea, grape, ping pong ball)      Size of two quarters 3. LOCATION: "Where is the swelling located?"     Right side of neck 4. ONSET: "When did the swelling start?"     today 5. COLOR: "What color is it?" "Is there more than one color?"     normal 6. PAIN: "Is there any pain?" If Yes, ask: "How bad is the pain?" (e.g., scale 1-10; or mild, moderate, severe)     - NONE (0): no pain   - MILD (1-3): doesn't interfere with normal activities    - MODERATE (4-7): interferes with normal activities or awakens from sleep    - SEVERE (8-10): excruciating pain, unable to do any normal activities     Denies pain, only to touch 7. ITCH: "Does it itch?" If Yes, ask: "How bad is the itch?"      denies 8. CAUSE: "What do you think caused the swelling?" unknown 9 OTHER SYMPTOMS: "Do  you have any other symptoms?" (e.g., fever) denies  Protocols used: Skin Lump or Localized Swelling-A-AH

## 2023-09-20 NOTE — ED Triage Notes (Signed)
 Pt states yesterday she felt a little strain in her throat after eating. Her throat was just dry this morning. She has right sided lump on her neck and pain when swallowing.   She is also late her LMP was 08/15/2023

## 2023-09-20 NOTE — Discharge Instructions (Addendum)
 Take amoxicillin-clavulanate 875 mg--1 tab twice daily with food for 7 days,  Please follow-up with your primary care about this issue.  The pregnancy test was negative

## 2023-09-20 NOTE — ED Provider Notes (Signed)
 [facility]    CSN: [id number] Arrival date & time: 09/20/23  1608      History   Chief Complaint No chief complaint on file.   HPI Brenda Colon is a 43 y.o. female.   HPI Here for some pain and swelling in her right neck under her jaw.  She noticed a little discomfort last night when she ate and then a little bit more this morning early when she drank some water.  Then during the mornings began noticing swelling under her jaw.  No fever or chills no cough or congestion.  It is possibly a tad smaller than it was earlier today.  Last menstrual cycle was March 9.  NKDA Past Medical History:  Diagnosis Date   GERD (gastroesophageal reflux disease)    Hypertension    STD (sexually transmitted disease)    Gonorrhea,Trich   Uterine fibroid     Patient Active Problem List   Diagnosis Date Noted   Iron deficiency 02/24/2023   Endometrial polyp 10/28/2022   Abnormal uterine bleeding (AUB) 10/14/2022   Morbid obesity (HCC) 02/10/2022   GERD without esophagitis 02/10/2022   Prediabetes 02/10/2022   Hyperlipemia, mixed 11/30/2020   Esophageal dysphagia 11/29/2020   Essential hypertension 11/29/2020   Intrauterine pregnancy 08/21/2020   Pregnancy with uncertain fetal viability 08/21/2020   Chronic hypertension affecting pregnancy 12/30/2017   Family history of microcephaly 09/14/2017   Fibroid 08/15/2017   Chronic hypertension during pregnancy, antepartum 03/26/2017   GERD with esophagitis 03/26/2017    Past Surgical History:  Procedure Laterality Date   CYSTECTOMY     Wrist   HYSTEROSCOPY WITH D & C N/A 10/28/2022   Procedure: DILATATION AND CURETTAGE /HYSTEROSCOPY WITH MYOSURE;  Surgeon: Lorriane Shire, MD;  Location: [facility];  Service: Gynecology;  Laterality: N/A;   INDUCED ABORTION      OB History     Gravida  4   Para  1   Term  1   Preterm      AB  2   Living  1      SAB  1   IAB  1   Ectopic       Multiple  0   Live Births  1            Home Medications    Prior to Admission medications   Medication Sig Start Date End Date Taking? Authorizing Provider  amLODipine (NORVASC) 10 MG tablet Take 1 tablet (10 mg total) by mouth daily. 01/12/23  Yes Claiborne Rigg, NP  amoxicillin-clavulanate (AUGMENTIN) 875-125 MG tablet Take 1 tablet by mouth 2 (two) times daily for 7 days. 09/20/23 09/27/23 Yes Zenia Resides, MD  ferrous sulfate 325 (65 FE) MG tablet Take 1 tablet (325 mg total) by mouth daily with breakfast. 02/24/23  Yes Marcine Matar, MD  omeprazole (PRILOSEC) 20 MG capsule TAKE 1 CAPSULE(20 MG) BY MOUTH DAILY 07/22/23  Yes Claiborne Rigg, NP  propranolol (INDERAL) 20 MG tablet Take 1 tablet (20 mg total) by mouth 2 (two) times daily. 07/27/23  Yes Marcine Matar, MD  spironolactone (ALDACTONE) 25 MG tablet Take 1 tablet (25 mg total) by mouth daily. 06/29/23  Yes Marcine Matar, MD  acetaminophen (TYLENOL) 325 MG tablet Take 2 tablets (650 mg total) by mouth every 6 (six) hours as needed. 10/28/22   Lorriane Shire, MD  hydrOXYzine (ATARAX) 10 MG tablet Take 1 tablet (10 mg total) by mouth 3 (  three) times daily as needed for itching. 01/12/23   Fleming, Zelda W, NP  meloxicam (MOBIC) 15 MG tablet Take 1 tablet (15 mg total) by mouth daily. 06/29/23   Lawrance Presume, MD  methocarbamol (ROBAXIN) 500 MG tablet Take 1 tablet (500 mg total) by mouth as needed for muscle spasms. 01/12/23   Fleming, Zelda W, NP  solifenacin (VESICARE) 5 MG tablet Take 5 mg by mouth daily. Patient not taking: Reported on 02/22/2023    [provider]  SUMAtriptan (IMITREX) 50 MG tablet 1 tab PO at start of headache. May repeat in 2 hours if headache persists or recurs. Max 2 tabs/24 hr 07/27/23   Lawrance Presume, MD  NIFEdipine (PROCARDIA-XL/ADALAT-CC/NIFEDICAL-XL) 30 MG 24 hr tablet Take by mouth once per day. 12/02/17 12/22/18  Othelia Blinks, MD    Family History Family  History  Problem Relation Age of Onset   Diabetes Mother    Diabetes Father    Lupus Sister     Social History Social History   Tobacco Use   Smoking status: Never   Smokeless tobacco: Never  Vaping Use   Vaping status: Never Used  Substance Use Topics   Alcohol use: No   Drug use: No     Allergies   Patient has no known allergies.   Review of Systems Review of Systems   Physical Exam Triage Vital Signs ED Triage Vitals  Encounter Vitals Group     BP 09/20/23 1702 (!) 166/128     Systolic BP Percentile --      Diastolic BP Percentile --      Pulse Rate 09/20/23 1702 79     Resp 09/20/23 1702 18     Temp 09/20/23 1702 98.7 F (37.1 C)     Temp Source 09/20/23 1702 Oral     SpO2 09/20/23 1702 97 %     Weight --      Height --      Head Circumference --      Peak Flow --      Pain Score 09/20/23 1700 5     Pain Loc --      Pain Education --      Exclude from Growth Chart --    No data found.  Updated Vital Signs BP (!) 166/128 (BP Location: Right Arm) Comment: took meds today but missed yesterday.  Pulse 79   Temp 98.7 F (37.1 C) (Oral)   Resp 18   LMP 08/15/2023 (Exact Date)   SpO2 97%   Visual Acuity Right Eye Distance:   Left Eye Distance:   Bilateral Distance:    Right Eye Near:   Left Eye Near:    Bilateral Near:     Physical Exam Vitals reviewed.  Constitutional:      General: She is not in acute distress.    Appearance: She is not toxic-appearing or diaphoretic.  HENT:     Right Ear: Tympanic membrane and ear canal normal.     Left Ear: Tympanic membrane and ear canal normal.     Nose: Nose normal.     Mouth/Throat:     Mouth: Mucous membranes are moist.     Pharynx: No oropharyngeal exudate or posterior oropharyngeal erythema.  Eyes:     Extraocular Movements: Extraocular movements intact.     Conjunctiva/sclera: Conjunctivae normal.     Pupils: Pupils are equal, round, and reactive to light.  Neck:     Comments: There is a  tender  area of swelling about 4 cm in diameter in her right submandibular area.  No fluctuance Cardiovascular:     Rate and Rhythm: Normal rate and regular rhythm.     Heart sounds: No murmur heard. Pulmonary:     Effort: Pulmonary effort is normal. No respiratory distress.     Breath sounds: No stridor. No wheezing, rhonchi or rales.  Musculoskeletal:     Cervical back: Neck supple.  Skin:    Capillary Refill: Capillary refill takes less than 2 seconds.     Coloration: Skin is not jaundiced or pale.  Neurological:     General: No focal deficit present.     Mental Status: She is alert and oriented to person, place, and time.  Psychiatric:        Behavior: Behavior normal.      UC Treatments / Results  Labs (all labs ordered are listed, but only abnormal results are displayed) Labs Reviewed  POCT URINE PREGNANCY    EKG   Radiology No results found.  Procedures Procedures (including critical care time)  Medications Ordered in UC Medications - No data to display  Initial Impression / Assessment and Plan / UC Course  I have reviewed the triage vital signs and the nursing notes.  Pertinent labs & imaging results that were available during my care of the patient were reviewed by me and considered in my medical decision making (see chart for details).     Augmentin is sent in in case this is an inflamed lymph node.  I discussed with her that it could also be a salivary gland that is swelling.  She will follow-up with her primary care. Final Clinical Impressions(s) / UC Diagnoses   Final diagnoses:  Lymphadenitis     Discharge Instructions      Take amoxicillin-clavulanate 875 mg--1 tab twice daily with food for 7 days,  Please follow-up with your primary care about this issue.  The pregnancy test was negative     ED Prescriptions     Medication Sig Dispense Auth. Provider   amoxicillin-clavulanate (AUGMENTIN) 875-125 MG tablet Take 1 tablet by mouth 2  (two) times daily for 7 days. 14 tablet Shir Bergman K, MD      PDMP not reviewed this encounter.   Ann Keto, MD 09/20/23 [phone]

## 2023-09-21 ENCOUNTER — Ambulatory Visit (HOSPITAL_COMMUNITY)

## 2023-10-01 ENCOUNTER — Other Ambulatory Visit: Payer: Self-pay | Admitting: Internal Medicine

## 2023-10-01 DIAGNOSIS — I10 Essential (primary) hypertension: Secondary | ICD-10-CM

## 2023-10-06 ENCOUNTER — Emergency Department (HOSPITAL_COMMUNITY)
Admission: EM | Admit: 2023-10-06 | Discharge: 2023-10-06 | Disposition: A | Discharge status: Home / Self Care | Attending: Emergency Medicine | Admitting: Emergency Medicine

## 2023-10-06 ENCOUNTER — Ambulatory Visit: Payer: Self-pay | Admitting: *Deleted

## 2023-10-06 ENCOUNTER — Other Ambulatory Visit: Payer: Self-pay

## 2023-10-06 ENCOUNTER — Encounter (HOSPITAL_COMMUNITY): Payer: Self-pay | Admitting: *Deleted

## 2023-10-06 DIAGNOSIS — N938 Other specified abnormal uterine and vaginal bleeding: Secondary | ICD-10-CM | POA: Diagnosis not present

## 2023-10-06 DIAGNOSIS — N939 Abnormal uterine and vaginal bleeding, unspecified: Secondary | ICD-10-CM | POA: Diagnosis present

## 2023-10-06 LAB — CBC WITH DIFFERENTIAL/PLATELET
Abs Immature Granulocytes: 0.02 10*3/uL (ref 0.00–0.07)
Basophils Absolute: 0 10*3/uL (ref 0.0–0.1)
Basophils Relative: 0 %
Eosinophils Absolute: 0.1 10*3/uL (ref 0.0–0.5)
Eosinophils Relative: 1 %
HCT: 38.5 % (ref 36.0–46.0)
Hemoglobin: 12.4 g/dL (ref 12.0–15.0)
Immature Granulocytes: 0 %
Lymphocytes Relative: 21 %
Lymphs Abs: 1.4 10*3/uL (ref 0.7–4.0)
MCH: 27.6 pg (ref 26.0–34.0)
MCHC: 32.2 g/dL (ref 30.0–36.0)
MCV: 85.6 fL (ref 80.0–100.0)
Monocytes Absolute: 0.5 10*3/uL (ref 0.1–1.0)
Monocytes Relative: 7 %
Neutro Abs: 4.9 10*3/uL (ref 1.7–7.7)
Neutrophils Relative %: 71 %
Platelets: 261 10*3/uL (ref 150–400)
RBC: 4.5 MIL/uL (ref 3.87–5.11)
RDW: 15.7 % — ABNORMAL HIGH (ref 11.5–15.5)
WBC: 6.9 10*3/uL (ref 4.0–10.5)
nRBC: 0 % (ref 0.0–0.2)

## 2023-10-06 LAB — HCG, SERUM, QUALITATIVE: Preg, Serum: NEGATIVE

## 2023-10-06 MED ORDER — KETOROLAC TROMETHAMINE 15 MG/ML IJ SOLN
15.0000 mg | Freq: Once | INTRAMUSCULAR | Status: AC
  Administered 2023-10-06: 15 mg via INTRAMUSCULAR
  Filled 2023-10-06: qty 1

## 2023-10-06 NOTE — Telephone Encounter (Signed)
 Copied from CRM [id number]. Topic: Clinical - Red Word Triage >> Oct 06, 2023 10:03 AM Brenda Colon wrote: Red Word that prompted transfer to Nurse Triage:  period is late supposed to come on 9th but ended up coming on 18th ended 22nd. Lighter than normal. 26th came very heavy dizzy and weak and has been passing blood clots. Can't stay out of bathroom because she's been changing her pads. Reason for Disposition  Patient sounds very sick or weak to the triager    Very heavy menstrual cycle since Sat.  Answer Assessment - Initial Assessment Questions 1. DESCRIPTION: "Describe your dizziness."     [name] dizzy and having to change my pads a lot.  [name] having heavy vaginal bleeding since Sat.   Never had before.    My periods are usually light and I don't have cramps.   This time it was late.  To start on 9th but started on 18th.   It's been really heavy.   [name] having abd cramping and passing blood clots.   Yesterday I was hot and dizzy and feeling hot.    When I was pregnant I had large fibroids.   I had a polyp removed last year.   2. LIGHTHEADED: "Do you feel lightheaded?" (e.g., somewhat faint, woozy, weak upon standing)     [name] dizzy and very weak.   [name] so tired.   3. VERTIGO: "Do you feel like either you or the room is spinning or tilting?" (i.e. vertigo)     no 4. SEVERITY: "How bad is it?"  "Do you feel like you are going to faint?" "Can you stand and walk?"   - MILD: Feels slightly dizzy, but walking normally.   - MODERATE: Feels unsteady when walking, but not falling; interferes with normal activities (e.g., school, work).   - SEVERE: Unable to walk without falling, or requires assistance to walk without falling; feels like passing out now.      Moderate.   I feel real dizzy when I get up.   I don't feel like I want to walk around because [name] so weak. I've changed 6 pads since I got up at 5:00AM.   I had huge blood clots on the pads.    5. ONSET:  "When did the dizziness begin?"     Since Sat.    6. AGGRAVATING FACTORS: "Does anything make it worse?" (e.g., standing, change in head position)     [name] bleeding so heavy since Sat.   7. HEART RATE: "Can you tell me your heart rate?" "How many beats in 15 seconds?"  (Note: not all patients can do this)       Not asked  8. CAUSE: "What do you think is causing the dizziness?"     [name] having heavy periods.   [facility] ED.    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  9. RECURRENT SYMPTOM: "Have you had dizziness before?" If Yes, ask: "When was the last time?" "What happened that time?"     No.   Usually my periods are light but this time it was late and it's been heavy with cramping and passing blood clots. 10. OTHER SYMPTOMS: "Do you have any other symptoms?" (e.g., fever, chest pain, vomiting, diarrhea, bleeding)       Dizzy, weak, tired, abd cramping. Changing pads frequently 11. PREGNANCY: "Is there any chance you are pregnant?" "When was your last menstrual period?"  Not asked  Protocols used: Dizziness - Lightheadedness-A-AH  Chief Complaint: Heavy menstrual cycle since Sat causing her to be dizzy, tired, and weak.    Has changed pads 6 times since 5:00 AM this morning with large blood clots noted. Symptoms: Abd cramping, passing large blood clots, changing her pads frequently.    Frequency: Since Sat. Pertinent Negatives: Patient denies passing out but very dizzy, tired and weak. Disposition: [x] ED /[] Urgent Care (no appt availability in office) / [] Appointment(In office/virtual)/ []  [place] [facility]/ [] Home Care/ [] Refused Recommended Disposition /[] [place] [facility]/ []  Follow-up with PCP Additional Notes: I have referred her to the ED.   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      Message sent to Dr. Concetta Dee.

## 2023-10-06 NOTE — ED Provider Notes (Signed)
 [place] EMERGENCY DEPARTMENT AT [facility] Provider Note   CSN: [id number] Arrival date & time: 10/06/23  1036     History  Chief Complaint  Patient presents with   Vaginal Bleeding    FLORIS GAMEROS is a 43 y.o. female.  43 yo F with a chief complaint of vaginal bleeding.  This been going on for a few days now.  She typically has very light menstrual cycles and is normally quite regular.  This time she was a bit late for her cycle and has been having much more bleeding than normal.  Pelvic cramping with it.  She ended up calling a nursing hotline and they encouraged her to come to the ED for evaluation.   Vaginal Bleeding      Home Medications Prior to Admission medications   Medication Sig Start Date End Date Taking? Authorizing Provider  acetaminophen  (TYLENOL ) 325 MG tablet Take 2 tablets (650 mg total) by mouth every 6 (six) hours as needed. 10/28/22   Ajewole, Christana, MD  amLODipine  (NORVASC ) 10 MG tablet TAKE 1 TABLET(10 MG) BY MOUTH DAILY 10/01/23   Lawrance Presume, MD  ferrous sulfate  325 (65 FE) MG tablet Take 1 tablet (325 mg total) by mouth daily with breakfast. 02/24/23   Lawrance Presume, MD  hydrOXYzine  (ATARAX ) 10 MG tablet Take 1 tablet (10 mg total) by mouth 3 (three) times daily as needed for itching. 01/12/23   Fleming, Zelda W, NP  meloxicam  (MOBIC ) 15 MG tablet Take 1 tablet (15 mg total) by mouth daily. 06/29/23   Lawrance Presume, MD  methocarbamol  (ROBAXIN ) 500 MG tablet Take 1 tablet (500 mg total) by mouth as needed for muscle spasms. 01/12/23   Fleming, Zelda W, NP  omeprazole  (PRILOSEC) 20 MG capsule TAKE 1 CAPSULE(20 MG) BY MOUTH DAILY 07/22/23   Fleming, Zelda W, NP  propranolol  (INDERAL ) 20 MG tablet Take 1 tablet (20 mg total) by mouth 2 (two) times daily. 07/27/23   Lawrance Presume, MD  solifenacin  (VESICARE ) 5 MG tablet Take 5 mg by mouth daily. Patient not taking: Reported on 02/22/2023    [provider]   spironolactone  (ALDACTONE ) 25 MG tablet Take 1 tablet (25 mg total) by mouth daily. 06/29/23   Lawrance Presume, MD  SUMAtriptan  (IMITREX ) 50 MG tablet 1 tab PO at start of headache. May repeat in 2 hours if headache persists or recurs. Max 2 tabs/24 hr 07/27/23   Lawrance Presume, MD  NIFEdipine  (PROCARDIA -XL/ADALAT -CC/NIFEDICAL-XL) 30 MG 24 hr tablet Take by mouth once per day. 12/02/17 12/22/18  Ervin, Michael L, MD      Allergies    Patient has no known allergies.    Review of Systems   Review of Systems  Genitourinary:  Positive for vaginal bleeding.    Physical Exam Updated Vital Signs BP 108/83   Pulse 76   Temp 98.2 F (36.8 C) (Oral)   Resp 17   Ht 5\' 6"  (1.676 m)   Wt 123.4 kg   LMP 09/24/2023 (Exact Date)   SpO2 100%   BMI 43.91 kg/m  Physical Exam Vitals and nursing note reviewed.  Constitutional:      General: She is not in acute distress.    Appearance: She is well-developed. She is not diaphoretic.  HENT:     Head: Normocephalic and atraumatic.  Eyes:     Pupils: Pupils are equal, round, and reactive to light.  Cardiovascular:     Rate and Rhythm:  Normal rate and regular rhythm.     Heart sounds: No murmur heard.    No friction rub. No gallop.  Pulmonary:     Effort: Pulmonary effort is normal.     Breath sounds: No wheezing or rales.  Abdominal:     General: There is no distension.     Palpations: Abdomen is soft.     Tenderness: There is no abdominal tenderness.  Genitourinary:    Comments: Some blood and clots in the vault.  No significant bleeding from the os. Musculoskeletal:        General: No tenderness.     Cervical back: Normal range of motion and neck supple.  Skin:    General: Skin is warm and dry.  Neurological:     Mental Status: She is alert and oriented to person, place, and time.  Psychiatric:        Behavior: Behavior normal.     ED Results / Procedures / Treatments   Labs (all labs ordered are listed, but only abnormal  results are displayed) Labs Reviewed  CBC WITH DIFFERENTIAL/PLATELET - Abnormal; Notable for the following components:      Result Value   RDW 15.7 (*)    All other components within normal limits  HCG, SERUM, QUALITATIVE    EKG None  Radiology No results found.  Procedures Procedures    Medications Ordered in ED Medications  ketorolac  (TORADOL ) 15 MG/ML injection 15 mg (15 mg Intramuscular Given 10/06/23 1259)    ED Course/ Medical Decision Making/ A&P                                 Medical Decision Making Amount and/or Complexity of Data Reviewed Labs: ordered.  Risk Prescription drug management.   43 yo F with a chief complaints of having than normal vaginal bleeding.  Going on for about 3 to 4 days now.  Vital signs stable well-appearing nontoxic no significant abdominal discomfort.  Will check hemoglobin pregnancy test reassess.   Patient's hemoglobin is at her baseline.  No leukocytosis.  Pregnancy test is negative.  Pelvic exam without significant bleeding.  Will have her follow-up with GYN in the office.  1:27 PM:  I have discussed the diagnosis/risks/treatment options with the patient.  Evaluation and diagnostic testing in the emergency department does not suggest an emergent condition requiring admission or immediate intervention beyond what has been performed at this time.  They will follow up with GYN. We also discussed returning to the ED immediately if new or worsening sx occur. We discussed the sx which are most concerning (e.g., sudden worsening pain, fever, inability to tolerate by mouth, worsening bleeding, feeling like you might pass out) that necessitate immediate return. Medications administered to the patient during their visit and any new prescriptions provided to the patient are listed below.  Medications given during this visit Medications  ketorolac  (TORADOL ) 15 MG/ML injection 15 mg (15 mg Intramuscular Given 10/06/23 1259)     The  patient appears reasonably screen and/or stabilized for discharge and I doubt any other medical condition or other [facility] requiring further screening, evaluation, or treatment in the ED at this time prior to discharge.         Final Clinical Impression(s) / ED Diagnoses Final diagnoses:  Dysfunctional uterine bleeding    Rx / DC Orders ED Discharge Orders     None  Albertus Hughs, DO 10/06/23 1327

## 2023-10-06 NOTE — Discharge Instructions (Signed)
 Please follow-up with your GYN in the office.  Please call them today to set up an appointment.  Please return for worsening bleeding if you feel you get a pass out.

## 2023-10-06 NOTE — ED Triage Notes (Signed)
 Pt pov for vaginal bleeding.  LMP April 18-22nd, which was normal for pt, which was normal for pt, though early.  Saturday she began bleeding again, heavier than normal, to the point where she could feel clots coming out.  Yesterday she began experiencing hot flashes and dizziness.

## 2023-10-06 NOTE — Telephone Encounter (Signed)
 noted

## 2023-11-02 NOTE — Progress Notes (Signed)
 GYNECOLOGY VISIT  Patient name: Brenda Colon MRN [id number]  Date of birth: 1980/11/04 Chief Complaint:   Emergency Department Visit Follow Up  History:  Brenda Colon is a 43 y.o. [id number] ED follow up.   Expected mense 4/9, came 4/18 - 22 and then returned again the following Saturday, heavy bleeding. First heavy menses; typically light bleeding monthly, not on contraception  ED visit 10/06/23: cycle was later and heavier than usual w/ cramping. Recommended present to ED via nurse hotline. bHCG negative and CBC at baseline  S/p HSCP for polyp 10/28/22  Discussed the use of AI scribe software for clinical note transcription with the patient, who gave verbal consent to proceed.  History of Present Illness Brenda Colon is a 43 year old female who presents with irregular heavy menstrual bleeding.  She has been experiencing irregular and heavy menstrual bleeding. Her menstrual cycle, expected around April 9th, was delayed until April 18th. Initially light, the bleeding became heavy with significant clotting by April 19th, continuing daily until April 30th. She describes the bleeding as 'gush like' with clots that she could feel passing while sitting on the toilet.  On April 30th, she visited the emergency department due to the severity of the bleeding and associated symptoms of weakness and a hot flash. Blood tests were conducted, but no pelvic swab was taken. She felt weak and drained, with a lack of energy to walk, and experienced cramping, which is unusual for her as she typically does not have period symptoms. A pregnancy test was negative during the ED visit.  She has a history of a polypectomy following an ultrasound that showed abnormalities. She is concerned about the possibility of menopause or another miscarriage, as she has never experienced such heavy bleeding and clotting before. She has not had a period since the heavy bleeding episode in April and is uncertain if it will  return.  She desires to have another child and has been engaging in some sexual activity, though not consistently. She previously had her ovarian reserve checked last year and is open to re-evaluation.  No personal history of blood clots in the lungs or legs, and no issues with colorblindness or eye problems. Her oldest sister has a history of similar symptoms. She works in radiology and recently finished school.   Past Medical History:  Diagnosis Date   GERD (gastroesophageal reflux disease)    Hypertension    STD (sexually transmitted disease)    Gonorrhea,Trich   Uterine fibroid     Past Surgical History:  Procedure Laterality Date   CYSTECTOMY     Wrist   HYSTEROSCOPY WITH D & C N/A 10/28/2022   Procedure: DILATATION AND CURETTAGE /HYSTEROSCOPY WITH MYOSURE;  Surgeon: Kiki Pelton, MD;  Location: [facility];  Service: Gynecology;  Laterality: N/A;   INDUCED ABORTION      The following portions of the patient's history were reviewed and updated as appropriate: allergies, current medications, past family history, past medical history, past social history, past surgical history and problem list.   Health Maintenance:   Last pap     Component Value Date/Time   DIAGPAP  03/20/2022 1141    - Negative for intraepithelial lesion or malignancy (NILM)   HPVHIGH Negative 03/20/2022 1141   ADEQPAP  03/20/2022 1141    Satisfactory for evaluation; transformation zone component PRESENT.   Last mammogram: 04/2022 BIRADS 1   Review of Systems:  Pertinent items are noted in HPI. Comprehensive review  of systems was otherwise negative.   Objective:  Physical Exam BP (!) 126/90   Pulse 71   Wt 285 lb 4.8 oz (129.4 kg)   LMP 09/24/2023 (Exact Date)   BMI 46.05 kg/m    Physical Exam Vitals and nursing note reviewed.  Constitutional:      Appearance: Normal appearance.  HENT:     Head: Normocephalic and atraumatic.  Pulmonary:     Effort: Pulmonary effort  is normal.  Skin:    General: Skin is warm and dry.  Neurological:     General: No focal deficit present.     Mental Status: She is alert.  Psychiatric:        Mood and Affect: Mood normal.        Behavior: Behavior normal.        Thought Content: Thought content normal.        Judgment: Judgment normal.       Assessment & Plan:   Assessment & Plan Irregular heavy menstrual bleeding Irregular heavy menstrual bleeding with clotting, atypical despite history of polypectomy. Differential includes perimenopausal changes. Discussed TXA as nonhormonal management option. - Order pelvic ultrasound to assess for abnormalities. - Perform lab tests including thyroid  function and FSH to evaluate for menopause. - Prescribe tranexamic acid  (TXA) for recurrent heavy bleeding, with emergency care instructions for excessive bleeding. - Schedule follow-up to review lab and ultrasound results.   Routine preventative health maintenance measures emphasized.  Kiki Pelton, MD [facility], [facility]

## 2023-11-03 ENCOUNTER — Other Ambulatory Visit: Payer: Self-pay

## 2023-11-03 ENCOUNTER — Ambulatory Visit: Admitting: Obstetrics and Gynecology

## 2023-11-03 VITALS — BP 126/90 | HR 71 | Wt 285.3 lb

## 2023-11-03 DIAGNOSIS — Z3169 Encounter for other general counseling and advice on procreation: Secondary | ICD-10-CM

## 2023-11-03 DIAGNOSIS — N939 Abnormal uterine and vaginal bleeding, unspecified: Secondary | ICD-10-CM

## 2023-11-03 MED ORDER — TRANEXAMIC ACID 650 MG PO TABS: 1300.0000 mg | ORAL_TABLET | Freq: Three times a day (TID) | ORAL | 2 refills | Status: AC

## 2023-11-04 ENCOUNTER — Ambulatory Visit: Payer: Self-pay | Admitting: Obstetrics and Gynecology

## 2023-11-04 LAB — TSH RFX ON ABNORMAL TO FREE T4: TSH: 1.99 u[IU]/mL (ref 0.450–4.500)

## 2023-11-08 ENCOUNTER — Ambulatory Visit (HOSPITAL_COMMUNITY)
Admission: RE | Admit: 2023-11-08 | Discharge: 2023-11-08 | Disposition: A | Discharge status: Home / Self Care | Source: Ambulatory Visit | Attending: Obstetrics and Gynecology | Admitting: Obstetrics and Gynecology

## 2023-11-08 DIAGNOSIS — N939 Abnormal uterine and vaginal bleeding, unspecified: Secondary | ICD-10-CM | POA: Diagnosis not present

## 2023-11-08 DIAGNOSIS — N920 Excessive and frequent menstruation with regular cycle: Secondary | ICD-10-CM | POA: Diagnosis not present

## 2023-11-08 DIAGNOSIS — D259 Leiomyoma of uterus, unspecified: Secondary | ICD-10-CM | POA: Diagnosis not present

## 2023-11-10 LAB — FOLLICLE STIMULATING HORMONE: FSH: 3.1 m[IU]/mL

## 2023-11-10 LAB — ANTI MULLERIAN HORMONE: ANTI-MULLERIAN HORMONE (AMH): 0.352 ng/mL

## 2024-02-02 ENCOUNTER — Other Ambulatory Visit: Payer: Self-pay | Admitting: Nurse Practitioner

## 2024-02-02 ENCOUNTER — Other Ambulatory Visit: Payer: Self-pay | Admitting: Internal Medicine

## 2024-02-02 ENCOUNTER — Other Ambulatory Visit: Payer: Self-pay | Admitting: Obstetrics and Gynecology

## 2024-02-02 DIAGNOSIS — K219 Gastro-esophageal reflux disease without esophagitis: Secondary | ICD-10-CM

## 2024-02-02 DIAGNOSIS — I10 Essential (primary) hypertension: Secondary | ICD-10-CM

## 2024-02-02 DIAGNOSIS — G43009 Migraine without aura, not intractable, without status migrainosus: Secondary | ICD-10-CM

## 2024-02-02 DIAGNOSIS — G8929 Other chronic pain: Secondary | ICD-10-CM

## 2024-02-02 DIAGNOSIS — M47896 Other spondylosis, lumbar region: Secondary | ICD-10-CM

## 2024-02-02 MED ORDER — PROPRANOLOL HCL 20 MG PO TABS: 20.0000 mg | ORAL_TABLET | Freq: Two times a day (BID) | ORAL | 0 refills | Status: AC

## 2024-02-03 NOTE — Telephone Encounter (Signed)
 Requested Prescriptions  Refused Prescriptions Disp Refills   omeprazole  (PRILOSEC) 20 MG capsule [Pharmacy Med Name: OMEPRAZOLE  20MG  CAPSULES] 90 capsule     Sig: TAKE 1 CAPSULE(20 MG) BY MOUTH DAILY     Gastroenterology: Proton Pump Inhibitors Passed - 02/03/2024  3:34 PM      Passed - Valid encounter within last 12 months    Recent Outpatient Visits           6 months ago Migraine without aura and without status migrainosus, not intractable   [facility] Of [place]. [facility] Vicci Barnie NOVAK, MD   7 months ago Essential hypertension   [facility] - [facility] Dept Of [place]. [facility] Vicci Barnie NOVAK, MD   10 months ago Essential hypertension   [facility] - [facility] Dept Of [place]. [facility] Fleeta Tonia Garnette LITTIE, RPH-CPP   11 months ago Essential hypertension   [facility] - A Dept Of [place]. [facility] Vicci Barnie NOVAK, MD   1 year ago Accidental bee sting   [facility] - [facility] Dept Of [place]. [facility] [place], [place] W, NP               propranolol  (INDERAL ) 20 MG tablet [Pharmacy Med Name: PROPRANOLOL  20MG  TABLETS] 180 tablet     Sig: TAKE 1 TABLET(20 MG) BY MOUTH TWICE DAILY     Cardiovascular:  Beta Blockers Failed - 02/03/2024  3:34 PM      Failed - Last BP in normal range    BP Readings from Last 1 Encounters:  11/03/23 (!) 126/90         Failed - Valid encounter within last 6 months    Recent Outpatient Visits           6 months ago Migraine without aura and without status migrainosus, not intractable   [facility] - [facility] Dept Of [place]. [facility] Vicci Barnie NOVAK, MD   7 months ago Essential hypertension   [facility] - [facility] Dept Of [place]. [facility] Vicci Barnie NOVAK, MD   10 months ago Essential hypertension   [facility] [facility] - A Dept Of [place]. [facility] Fleeta Tonia Garnette LITTIE, RPH-CPP   11 months ago Essential hypertension   [facility] - A Dept Of [place]. [facility] Vicci Barnie NOVAK, MD   1 year ago Accidental bee sting   [facility] - [facility] Dept Of [place]. [facility] [place], [place] W, NP              Passed - Last Heart Rate in normal range    Pulse Readings from Last 1 Encounters:  11/03/23 71

## 2024-03-20 ENCOUNTER — Ambulatory Visit: Admitting: Internal Medicine

## 2024-03-23 ENCOUNTER — Other Ambulatory Visit: Payer: Self-pay | Admitting: Internal Medicine

## 2024-03-23 DIAGNOSIS — K219 Gastro-esophageal reflux disease without esophagitis: Secondary | ICD-10-CM
